# Patient Record
Sex: Male | Born: 1960
Health system: Southern US, Community
[De-identification: ages and names within clinical notes are randomized; demographics above are authoritative.]

## PROBLEM LIST (undated history)

## (undated) DIAGNOSIS — N400 Enlarged prostate without lower urinary tract symptoms: Secondary | ICD-10-CM

## (undated) DIAGNOSIS — G4730 Sleep apnea, unspecified: Secondary | ICD-10-CM

## (undated) DIAGNOSIS — M1990 Unspecified osteoarthritis, unspecified site: Secondary | ICD-10-CM

## (undated) DIAGNOSIS — I499 Cardiac arrhythmia, unspecified: Secondary | ICD-10-CM

## (undated) DIAGNOSIS — E039 Hypothyroidism, unspecified: Secondary | ICD-10-CM

## (undated) DIAGNOSIS — C801 Malignant (primary) neoplasm, unspecified: Secondary | ICD-10-CM

## (undated) DIAGNOSIS — F32A Depression, unspecified: Secondary | ICD-10-CM

## (undated) DIAGNOSIS — I10 Essential (primary) hypertension: Secondary | ICD-10-CM

## (undated) DIAGNOSIS — H9312 Tinnitus, left ear: Secondary | ICD-10-CM

## (undated) DIAGNOSIS — I491 Atrial premature depolarization: Secondary | ICD-10-CM

## (undated) DIAGNOSIS — F329 Major depressive disorder, single episode, unspecified: Secondary | ICD-10-CM

## (undated) HISTORY — PX: BACK SURGERY: SHX140

## (undated) HISTORY — PX: COLONOSCOPY: SHX174

---

## 2009-10-07 ENCOUNTER — Encounter: Admission: RE | Admit: 2009-10-07 | Discharge: 2009-10-07 | Payer: Self-pay | Admitting: Orthopedic Surgery

## 2015-09-11 ENCOUNTER — Ambulatory Visit (INDEPENDENT_AMBULATORY_CARE_PROVIDER_SITE_OTHER): Payer: Commercial Managed Care - HMO | Admitting: Sports Medicine

## 2015-09-11 ENCOUNTER — Encounter: Payer: Self-pay | Admitting: Sports Medicine

## 2015-09-11 VITALS — BP 135/74 | Ht 74.0 in | Wt 208.0 lb

## 2015-09-11 DIAGNOSIS — M2141 Flat foot [pes planus] (acquired), right foot: Secondary | ICD-10-CM | POA: Diagnosis not present

## 2015-09-11 DIAGNOSIS — M2142 Flat foot [pes planus] (acquired), left foot: Secondary | ICD-10-CM | POA: Diagnosis not present

## 2015-09-11 DIAGNOSIS — S76312A Strain of muscle, fascia and tendon of the posterior muscle group at thigh level, left thigh, initial encounter: Secondary | ICD-10-CM | POA: Diagnosis not present

## 2015-09-11 MED ORDER — NITROGLYCERIN 0.2 MG/HR TD PT24: MEDICATED_PATCH | TRANSDERMAL | Status: DC

## 2015-09-11 NOTE — Progress Notes (Signed)
  Rickey Cochran - 54 y.o. male MRN [id number]  Date of birth: 05/04/1961  SUBJECTIVE:  Including CC & ROS.  Rickey Cochran is a 54 y.o. male who presents today for left posterior hip pain.    Hip Pain left posterior, initial visit - patient has been having 2 months of ongoing left posterior hip pain located in the ischial tuberosity region. He denies a specific injury but notes that it was worse when he started increasing his training as well as running up hills. He also had new shoes at that time but denies ever having a similar presentation or injury. There are no night sweats, chills, fever, weight loss, bowel or bladder loss. Pain does not radiate down his leg and denies any paresthesias but does have pain that goes distally down his leg. Rest does improve his symptoms but he is still been able to run despite pain. Pain is not worse with going down hills.  PMHx - Updated and reviewed.  Contributory factors include: None PSHx - Updated and reviewed.  Contributory factors include:  None  FHx - Updated and reviewed.  Contributory factors include:  None  Medications - Denies    12 point ROS negative other than per HPI.   Exam:  Filed Vitals:   09/11/15 1044  BP: 135/74    Gen: NAD, A a O 3, normal affect Cardiorespiratory - Normal respiratory effort/rate.  RRR Skin: No rash or erythema Extremities: No edema Vascular: Bilateral lower extremity dorsalis pedis and posterior tibial pulse +2 Hip Exam:  Pelvic alignment unremarkable to inspection and palpation. Standing hip rotation and gait without trendelenburg / unsteadiness. Greater trochanter without tenderness to palpation. No tenderness over piriformis and greater trochanter. No SI joint tenderness and normal minimal SI movement. ROM: IR: 80 Deg, ER: 80 Deg, Flexion: 120 Deg, Extension: 100 Deg, Abduction: 45 Deg, Adduction: 45 Deg Strength:  IR: 5/5, ER: 5/5, Flexion (0 and 90 degrees): 5/5, Extension: 5/5, Abduction: 5/5,  Adduction: 5/5 Negative FADIR.  Negative FADIR with axial compression Negative FAIR and Freiberg  Negative FABER in all directions Negative Noble and Ober testing  Hamstring testing:  + pain with IR @ 90 degrees concentric and 30 degrees eccentric   Neurovascularly intact B/L LE  Imaging: [place] L hip posterior - hypoechoic changes at the conjoined tendon along with a small avulsion of the ischial tuberosity. He also has a musculotendinous junction hypoechoic region of the semimembranosus consistent with his exam. The sciatic nerve appears normal without any hypoechoic swelling or changes. The biceps femoris insertion on the fibular head appears normal and the muscle belly itself both short and long head are normal.

## 2015-09-11 NOTE — Patient Instructions (Signed)

## 2015-09-11 NOTE — Assessment & Plan Note (Signed)
Green insoles given and 3/16 inch heel support on left. -Scaphoid pads at next visit and may need custom orthotics eventually.

## 2015-09-11 NOTE — Assessment & Plan Note (Signed)
Evidence of tendinous junction problem as well as semimembranosus musculotendinous junction injury. -Therapy exercises gone over in depth with patient -Compression sleeve given and will wear with a type activity. -NSAIDs when necessary along with heat prior to exercise. Ice after exercise. -We will start Nordic eccentric strengthening when no pain. -Nitroglycerin patch to area.

## 2015-10-15 ENCOUNTER — Ambulatory Visit (INDEPENDENT_AMBULATORY_CARE_PROVIDER_SITE_OTHER): Payer: Commercial Managed Care - HMO | Admitting: Sports Medicine

## 2015-10-15 ENCOUNTER — Encounter: Payer: Self-pay | Admitting: Sports Medicine

## 2015-10-15 VITALS — BP 126/80 | Ht 74.0 in | Wt 205.0 lb

## 2015-10-15 DIAGNOSIS — S76312D Strain of muscle, fascia and tendon of the posterior muscle group at thigh level, left thigh, subsequent encounter: Secondary | ICD-10-CM

## 2015-10-15 DIAGNOSIS — M2141 Flat foot [pes planus] (acquired), right foot: Secondary | ICD-10-CM

## 2015-10-15 DIAGNOSIS — M2142 Flat foot [pes planus] (acquired), left foot: Secondary | ICD-10-CM | POA: Diagnosis not present

## 2015-10-15 NOTE — Assessment & Plan Note (Signed)
I added a larger heel lift on his sports insoles and this felt better  He will continue to use these but if they do not feel supportive enough we can always go to a custom orthotic

## 2015-10-15 NOTE — Progress Notes (Signed)
Patient ID: Rickey Cochran, male   DOB: 03-Nov-1961, 54 y.o.   MRN: [id number]  Patient returns for a left hamstring injury He states this is about 75% improved He has started back running and is up to 10 miles per week His normal training is about 22 miles per week He did the exercises faithfully for 2 weeks and has been little bit inconsistent for the past 2 weeks Using compression sleeve Sports insoles seem helpful  Review of systems No sciatica No numbness in the feet or legs No back pain No prostate or urinary tract infections  Physical exam  No acute distress, muscular male BP 126/80 mmHg  Ht 6\' 2"  (1.88 m)  Wt 205 lb (92.987 kg)  BMI 26.31 kg/m2  Full range of motion of the hip Normal hamstring motion Hip abduction strong bilaterally Hamstring testing in 4 positions is equal now on the left and right  Running gait shows that he is primarily a heel striker with some foot turnout bilaterally Pronation is  well controlled with sports insoles  Still mild tenderness to palpation of the left ischial tuberosity

## 2015-10-15 NOTE — Assessment & Plan Note (Signed)
He has made nice progress  Gradually increase his running to 2 miles per week  Try to do the home exercises at least 3-4 days per week adding some 1 leg knee bands and some lunges  Continue using compression sleeve  Recheck in 6 weeks to scan the small tear and see if this has healed

## 2016-04-15 MED FILL — AMOXICILLIN 500 MG CAPSULE: 500 | 7 days supply | Qty: 14 | Fill #0

## 2016-06-30 ENCOUNTER — Encounter: Payer: Self-pay | Admitting: Sports Medicine

## 2016-06-30 ENCOUNTER — Ambulatory Visit (INDEPENDENT_AMBULATORY_CARE_PROVIDER_SITE_OTHER): Payer: Commercial Managed Care - HMO | Admitting: Sports Medicine

## 2016-06-30 DIAGNOSIS — M25551 Pain in right hip: Secondary | ICD-10-CM | POA: Insufficient documentation

## 2016-06-30 DIAGNOSIS — M25552 Pain in left hip: Secondary | ICD-10-CM

## 2016-06-30 MED ORDER — MELOXICAM 15 MG PO TABS: 15.0000 mg | ORAL_TABLET | Freq: Every day | ORAL | 0 refills | Status: DC

## 2016-06-30 NOTE — Patient Instructions (Signed)
Thank you for coming in,   Please make an appointment in two weeks for an injection if your symptoms haven't improved.   Please use the medication in the mean time.   Please try the exercises provided to you.     Please feel free to call with any questions or concerns at any time, at [phone]. --Dr. Raeford Razor

## 2016-06-30 NOTE — Progress Notes (Signed)
  Rickey Cochran - 55 y.o. male MRN [id number]  Date of birth: 09-12-1961  SUBJECTIVE:  Including CC & ROS.  Chief Complaint  Patient presents with  . Hip Pain     Rickey Cochran is a 55 year old male that is presenting with left hip pain. He reports the pain is on the lateral aspect but also anterior and posterior in nature. This pain isn't present for a couple of months now. He was getting over hamstring issue last fall and started running more this spring. He noticed that the pain is occurring the more that he was running. He is able to run on Sunday with no problems but ran this morning and had pain at the beginning of the run. The pain is achy in nature and intermittent. The pain has no symptoms of radiculopathy. He denies a numbness or tingling.  ROS: No unexpected weight loss, fever, chills, swelling, instability, redness, otherwise see HPI    HISTORY: Past Medical, Surgical, Social, and Family History Reviewed & Updated per EMR.   Pertinent Historical Findings include: PMSHx -  Pes planus of both feet  PSHx -  No tobacco or alcohol use  FHx -  Mother with joint problems.   DATA REVIEWED: None to review   PHYSICAL EXAM:  VS: BP:114/61  HR:(!) 47bpm  TEMP: ( )  RESP:   HT:6\' 2"  (188 cm)   WT:208 lb (94.3 kg)  BMI:26.8 PHYSICAL EXAM: Gen: NAD, alert, cooperative with exam, well-appearing HEENT: clear conjunctiva, EOMI CV:  no edema, capillary refill brisk,  Resp: non-labored, normal speech Skin: no rashes, normal turgor  Neuro: no gross deficits.  Psych:  alert and oriented Hip: Some pain to palpation in the left greater trochanter. Mild tenderness to palpation on the anterior superior iliac spine on the left side. IR: 70 Deg, ER: 45 Deg, Flexion: 120 Deg,  Normal strength with hip abduction bilaterally. Strength testing with gluteus maximus was weaker than expected bilaterally Standing hip rotation and gait without trendelenburg sign / unsteadiness. No tenderness over  piriformis. Gait Normal gait with appropriate base width and stride length Foot: Has slight pronation on the left side Knee: extension and flexion adequate w/o genu varus or valgus Hip: No circumduction or contralateral drop Trunk: Neutral w/o lean  Limited ultrasound: Left hip: Views of the greater trochanter obtained with normal insertion of the gluteus medius and piriformis. This was viewed in the short and long axis. Enlargement of the bursa at the intersection between the gluteus medius and gluteus minimus that was measured to be 2.74 cm. There is also scar tissue formation that was hyperechoic just proximal to this area. Findings are consistent with bursitis.   ASSESSMENT & PLAN:   Left hip pain Most likely his pain is coming from this bursitis that was observed on ultrasound. His internal range of motion is normal which would not suggest arthritic change. His gluteus medius is strong but gluteus max is weaker. - Prescribed mobic. - He is going on a hike over the next few days so he'll follow-up in 2 weeks for an injection if symptoms are still occurring. - Prescribed home exercises that included lateral leg list, side step ups, lateral hip rotations, and kick swings

## 2016-06-30 NOTE — Assessment & Plan Note (Signed)
Most likely his pain is coming from this bursitis that was observed on ultrasound. His internal range of motion is normal which would not suggest arthritic change. His gluteus medius is strong but gluteus max is weaker. - Prescribed mobic. - He is going on a hike over the next few days so he'll follow-up in 2 weeks for an injection if symptoms are still occurring. - Prescribed home exercises that included lateral leg list, side step ups, lateral hip rotations, and kick swings

## 2016-07-14 ENCOUNTER — Ambulatory Visit (INDEPENDENT_AMBULATORY_CARE_PROVIDER_SITE_OTHER): Payer: Commercial Managed Care - HMO | Admitting: Sports Medicine

## 2016-07-14 ENCOUNTER — Encounter: Payer: Self-pay | Admitting: Sports Medicine

## 2016-07-14 VITALS — BP 130/70 | Ht 74.0 in | Wt 208.0 lb

## 2016-07-14 DIAGNOSIS — M25552 Pain in left hip: Secondary | ICD-10-CM

## 2016-07-14 MED ORDER — METHYLPREDNISOLONE ACETATE 40 MG/ML IJ SUSP
40.0000 mg | Freq: Once | INTRAMUSCULAR | Status: AC
  Administered 2016-07-14: 40 mg via INTRA_ARTICULAR

## 2016-07-14 NOTE — Progress Notes (Signed)
  Rickey Cochran - 55 y.o. male MRN [id number]  Date of birth: 04/12/1961  SUBJECTIVE:  Including CC & ROS.    Rickey Cochran is a 55 year old male that is presenting with left greater trochanteric bursitis. He was seen on 06/30/16 and this was viewed with ultrasound. He presents today for injection.  DATA REVIEWED: 06/30/16: limited US: left hip: Bursa appear to be enlarged at the intersection between the gluteus medius and the gluteus minimus.  PHYSICAL EXAM:  VS: BP:130/70  HR: bpm  TEMP: ( )  RESP:   HT:6\' 2"  (188 cm)   WT:208 lb (94.3 kg)  BMI:26.8   Aspiration/Injection Procedure Note Rickey Cochran [birth year]  Procedure: Injection Indications: Left greater trochanteric bursitis  Procedure Details Consent: Risks of procedure as well as the alternatives and risks of each were explained to the (patient/caregiver).  Consent for procedure obtained. Time Out: Verified patient identification, verified procedure, site/side was marked, verified correct patient position, special equipment/implants available, medications/allergies/relevent history reviewed, required imaging and test results available.  Performed.  The area was cleaned with iodine and alcohol swabs.    The left greater trochanter bursa was injected using 1 cc of 40 mg Depomedrol and 4 cc's of 1% lidocaine with a 21 1 1/2" needle.  Ultrasound was used. Images were obtained in transverse views showing the injection.    Patient did tolerate procedure well.   ASSESSMENT & PLAN:   Left hip pain Injected his left greater trochanter bursa with ultrasound guidance today. Images were taken. He tolerated procedure well. - Advised to follow-up in 6 weeks of having known her admit and also to rescan with the ultrasound look for the calcific changes. - Advised that he can resume his exercises and stretches after 5 days from today.

## 2016-07-14 NOTE — Assessment & Plan Note (Signed)
Injected his left greater trochanter bursa with ultrasound guidance today. Images were taken. He tolerated procedure well. - Advised to follow-up in 6 weeks of having known her admit and also to rescan with the ultrasound look for the calcific changes. - Advised that he can resume his exercises and stretches after 5 days from today.

## 2016-08-11 ENCOUNTER — Ambulatory Visit: Payer: Commercial Managed Care - HMO | Admitting: Sports Medicine

## 2016-08-25 ENCOUNTER — Ambulatory Visit (INDEPENDENT_AMBULATORY_CARE_PROVIDER_SITE_OTHER): Payer: Commercial Managed Care - HMO | Admitting: Sports Medicine

## 2016-08-25 ENCOUNTER — Ambulatory Visit: Payer: Self-pay

## 2016-08-25 ENCOUNTER — Encounter: Payer: Self-pay | Admitting: Sports Medicine

## 2016-08-25 VITALS — BP 128/60 | Ht 74.0 in | Wt 208.0 lb

## 2016-08-25 DIAGNOSIS — M25552 Pain in left hip: Secondary | ICD-10-CM

## 2016-08-25 NOTE — Progress Notes (Signed)
CC: Left Hip Pain  Last visit patient received an injection to the left gluteus medius He had one week relief from pain Pain has returned to about the same level Hurts  lateral hip after he has been sitting / gets up to walk Pain with prolonged standing Not much pain with running Some pain noted along uphill and downhill with hiking or running  Pain now seems more anterior than lateral  Social history Nonsmoker  Review of systems No sciatica No groin pain  Physical examination Athletic male no acute distress BP 128/60   Ht 6\' 2"  (1.88 m)   Wt 208 lb (94.3 kg)   BMI 26.71 kg/m   Rotational range of motion of the hips is normal FABER is tight on the right side Tenderness to palpation over the left tensor fascia lata and upper thigh Mild tenderness just above the greater trochanter Hip abduction strength is excellent  Ultrasound of left hip Area of calcification in the gluteus medius actually looks somewhat improved Mild irregularity of the greater trochanter No obvious tears or hypoechoic changes of the gluteus medius or minimus In the upper rectus femoris at the reflected head there is a half centimeter hypoechoic gap and there is some calcification There is 1 small calcification separated from the attachment Doppler flow was not remarkable  Impression: 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

## 2016-08-25 NOTE — Assessment & Plan Note (Signed)
Calcific tendinopathy changes of gluteus medius seem somewhat improved but may not have been source of pain.  Further [place] scanning shows more involvement of the  Rectus femoris.  This may be why pain is more anterior.  I suggested modifying his home exercise program. Emphasize more hip flexion. 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  Go ahead and run and less this is worsening his symptoms. We will recheck if not resolved in 3 months.

## 2016-12-23 MED FILL — SERTRALINE HCL 100 MG TAB: 100 | 30 days supply | Qty: 30 | Fill #0

## 2017-01-24 MED FILL — SERTRALINE HCL 100 MG TAB: 100 | 30 days supply | Qty: 30 | Fill #1

## 2017-02-24 MED FILL — SERTRALINE HCL 100 MG TAB: 100 | 30 days supply | Qty: 30 | Fill #2

## 2017-03-24 MED FILL — SERTRALINE HCL 100 MG TAB: 100 | 30 days supply | Qty: 30 | Fill #3

## 2017-04-21 MED FILL — SERTRALINE HCL 100 MG TAB: 100 | 30 days supply | Qty: 30 | Fill #4

## 2017-05-24 MED FILL — SERTRALINE HCL 100 MG TAB: 100 | 30 days supply | Qty: 30 | Fill #5

## 2017-06-27 MED FILL — SERTRALINE HCL 100 MG TAB: 100 | 30 days supply | Qty: 30 | Fill #6

## 2017-07-27 MED FILL — SERTRALINE HCL 100 MG TAB: 100 | 30 days supply | Qty: 30 | Fill #7

## 2017-08-26 MED FILL — SERTRALINE HCL 100 MG TAB: 100 | 30 days supply | Qty: 30 | Fill #8

## 2017-09-07 DIAGNOSIS — N400 Enlarged prostate without lower urinary tract symptoms: Secondary | ICD-10-CM | POA: Diagnosis not present

## 2017-09-07 DIAGNOSIS — F338 Other recurrent depressive disorders: Secondary | ICD-10-CM | POA: Diagnosis not present

## 2017-09-07 DIAGNOSIS — I10 Essential (primary) hypertension: Secondary | ICD-10-CM | POA: Diagnosis not present

## 2017-09-07 DIAGNOSIS — Z23 Encounter for immunization: Secondary | ICD-10-CM | POA: Diagnosis not present

## 2017-09-07 DIAGNOSIS — Z8371 Family history of colonic polyps: Secondary | ICD-10-CM | POA: Diagnosis not present

## 2017-09-07 DIAGNOSIS — R7301 Impaired fasting glucose: Secondary | ICD-10-CM | POA: Diagnosis not present

## 2017-09-07 DIAGNOSIS — Z0000 Encounter for general adult medical examination without abnormal findings: Secondary | ICD-10-CM | POA: Diagnosis not present

## 2017-09-07 DIAGNOSIS — E7800 Pure hypercholesterolemia, unspecified: Secondary | ICD-10-CM | POA: Diagnosis not present

## 2017-09-26 MED FILL — SERTRALINE HCL 100 MG TAB: 100 | 90 days supply | Qty: 90 | Fill #0

## 2017-12-26 MED FILL — SERTRALINE HCL 100 MG TAB: 100 | 90 days supply | Qty: 90 | Fill #1

## 2018-03-28 ENCOUNTER — Other Ambulatory Visit: Payer: Commercial Managed Care - HMO

## 2018-03-28 ENCOUNTER — Other Ambulatory Visit: Payer: Self-pay | Admitting: Family Medicine

## 2018-03-28 DIAGNOSIS — R221 Localized swelling, mass and lump, neck: Secondary | ICD-10-CM

## 2018-03-29 ENCOUNTER — Ambulatory Visit (INDEPENDENT_AMBULATORY_CARE_PROVIDER_SITE_OTHER): Payer: No Typology Code available for payment source

## 2018-03-29 ENCOUNTER — Other Ambulatory Visit: Payer: Self-pay | Admitting: Family Medicine

## 2018-03-29 DIAGNOSIS — E042 Nontoxic multinodular goiter: Secondary | ICD-10-CM | POA: Diagnosis not present

## 2018-03-29 DIAGNOSIS — R221 Localized swelling, mass and lump, neck: Secondary | ICD-10-CM

## 2018-03-29 MED FILL — SERTRALINE HCL 100 MG TAB: 100 | 90 days supply | Qty: 90 | Fill #0

## 2018-03-30 ENCOUNTER — Other Ambulatory Visit: Payer: Self-pay | Admitting: Family Medicine

## 2018-03-30 DIAGNOSIS — E041 Nontoxic single thyroid nodule: Secondary | ICD-10-CM

## 2018-04-13 ENCOUNTER — Other Ambulatory Visit (HOSPITAL_COMMUNITY)
Admission: RE | Admit: 2018-04-13 | Discharge: 2018-04-13 | Disposition: A | Discharge status: Home / Self Care | Payer: No Typology Code available for payment source | Source: Ambulatory Visit | Attending: Radiology | Admitting: Radiology

## 2018-04-13 ENCOUNTER — Ambulatory Visit
Admission: RE | Admit: 2018-04-13 | Discharge: 2018-04-13 | Disposition: A | Discharge status: Home / Self Care | Payer: No Typology Code available for payment source | Source: Ambulatory Visit | Attending: Family Medicine | Admitting: Family Medicine

## 2018-04-13 DIAGNOSIS — E041 Nontoxic single thyroid nodule: Secondary | ICD-10-CM | POA: Diagnosis present

## 2018-05-01 ENCOUNTER — Ambulatory Visit: Payer: Self-pay | Admitting: General Surgery

## 2018-05-01 NOTE — H&P (Signed)
History of Present Illness Ralene Ok MD; 05/01/2018 9:55 AM) The patient is a 57 year old male who presents with a thyroid nodule. Referred by: Mitzi Hansen brake, FNP Chief Complaint: Thyroid nodule  Patient is a 57 year old male who comes in with a one-month history of a right thyroid nodule. Patient states that she had no symptoms prior to visualizing the nodule. He states that he had no dysphagia, dyspnea with lying down. Patient underwent ultrasound which revealed a 5.2 cm right-sided nodule. Patient also had a left thyroid approximate 1 cm nodule. The right thyroid nodule was biopsied. This returned as follicular cells. It was recommended that the left thyroid nodule be follow back up in 6-12 months.  Patient had no previous history of cancer.  PT is scheduled to run 1/2 marathon in [place] in [place]   Past Surgical History Lindwood Coke, RN; 05/01/2018 9:28 AM) Oral Surgery   Diagnostic Studies History Lindwood Coke, RN; 05/01/2018 9:28 AM) Colonoscopy  5-10 years ago  Allergies Lindwood Coke, RN; 05/01/2018 9:28 AM) No Known Drug Allergies [05/01/2018]: Allergies Reconciled   Medication History (Diane Herrin, RN; 05/01/2018 9:30 AM) Sertraline HCl (100MG  Tablet, Oral) Active. Multivitamin Adult (Oral) Active. Vitamin B12 (100MCG Tablet, Oral) Active. Red Yeast Rice (600MG  Tablet, Oral) Active. Medications Reconciled  Social History Lindwood Coke, RN; 05/01/2018 9:28 AM) Alcohol use  Remotely quit alcohol use. Caffeine use  Carbonated beverages, Coffee, Tea. No drug use  Tobacco use  Former smoker.  Family History Lindwood Coke, RN; 05/01/2018 9:28 AM) Arthritis  Mother. Colon Polyps  Father. Depression  Father. Heart Disease  Father, Mother. Hypertension  Father, Mother. Melanoma  Father.  Other Problems (Diane Herrin, RN; 05/01/2018 9:28 AM) Back Pain  Depression  High blood pressure  Hypercholesterolemia     Review of Systems  Ralene Ok MD; 05/01/2018 9:53 AM) General Not Present- Appetite Loss, Chills, Fatigue, Fever, Night Sweats, Weight Gain and Weight Loss. Skin Not Present- Change in Wart/Mole, Dryness, Hives, Jaundice, New Lesions, Non-Healing Wounds, Rash and Ulcer. HEENT Not Present- Earache, Hearing Loss, Hoarseness, Nose Bleed, Oral Ulcers, Ringing in the Ears, Seasonal Allergies, Sinus Pain, Sore Throat, Visual Disturbances, Wears glasses/contact lenses and Yellow Eyes. Respiratory Present- Snoring. Not Present- Bloody sputum, Chronic Cough, Difficulty Breathing and Wheezing. Breast Not Present- Breast Mass, Breast Pain, Nipple Discharge and Skin Changes. Cardiovascular Not Present- Chest Pain, Difficulty Breathing Lying Down, Leg Cramps, Palpitations, Rapid Heart Rate, Shortness of Breath and Swelling of Extremities. Gastrointestinal Not Present- Abdominal Pain, Bloating, Bloody Stool, Change in Bowel Habits, Chronic diarrhea, Constipation, Difficulty Swallowing, Excessive gas, Gets full quickly at meals, Hemorrhoids, Indigestion, Nausea, Rectal Pain and Vomiting. Male Genitourinary Not Present- Blood in Urine, Change in Urinary Stream, Frequency, Impotence, Nocturia, Painful Urination, Urgency and Urine Leakage. Musculoskeletal Not Present- Back Pain, Joint Pain, Joint Stiffness, Muscle Pain, Muscle Weakness and Swelling of Extremities. Neurological Not Present- Decreased Memory, Fainting, Headaches, Numbness, Seizures, Tingling, Tremor, Trouble walking and Weakness. Psychiatric Not Present- Anxiety, Bipolar, Change in Sleep Pattern, Depression, Fearful and Frequent crying. Endocrine Present- Heat Intolerance. Not Present- Cold Intolerance, Excessive Hunger, Hair Changes, Hot flashes and New Diabetes. Hematology Not Present- Blood Thinners, Easy Bruising, Excessive bleeding, Gland problems, HIV and Persistent Infections. All other systems negative  Vitals (Diane Herrin RN; 05/01/2018 9:31 AM) 05/01/2018  9:30 AM Weight: 214.38 lb Height: 74in Body Surface Area: 2.24 m Body Mass Index: 27.52 kg/m  Temp.: 97.44F(Oral)  Pulse: 44 (Regular)  P.OX: 98% (Room air) BP: 134/76 (Sitting, Left Arm, Standard)  Physical Exam Ralene Ok MD; 05/01/2018 9:56 AM) The physical exam findings are as follows: Note:Constitutional: No acute distress, conversant, appears stated age  Eyes: Anicteric sclerae, moist conjunctiva, no lid lag  Neck: Right thyroid nodule, soft, no nodule palpated in the left thyroidm, trachea midline, no cervical lymphadenopathy  Lungs: Clear to auscultation biilaterally, normal respiratory effot  Cardiovascular: regular rate & rhythm, no murmurs, no peripheal edema, pedal pulses 2+  GI: Soft, no masses or hepatosplenomegaly, non-tender to palpation  MSK: Normal gait, no clubbing cyanosis, edema  Skin: No rashes, palpation reveals normal skin turgor  Psychiatric: Appropriate judgment and insight, oriented to person, place, and time    Assessment & Plan Ralene Ok MD; 05/01/2018 9:57 AM) THYROID NODULE (E04.1) Impression: 57 year old male with a right thyroid nodule, with biopsy results and follicular cells. 1. The patient is ready to proceed to the operating room for right thyroid lobectomy. 2. All risks and benefits were discussed with the patient to generally include: infection, bleeding, damage to the recurrent laryngeal nerve, damage to parathyroid glands, and possible need for further surgery. Alternatives were offered and described. All questions were answered and the patient voiced understanding of the procedure and wishes to proceed.

## 2018-05-01 NOTE — H&P (View-Only) (Signed)
History of Present Illness Ralene Ok MD; 05/01/2018 9:55 AM) The patient is a 57 year old male who presents with a thyroid nodule. Referred by: Mitzi Hansen brake, FNP Chief Complaint: Thyroid nodule  Patient is a 57 year old male who comes in with a one-month history of a right thyroid nodule. Patient states that she had no symptoms prior to visualizing the nodule. He states that he had no dysphagia, dyspnea with lying down. Patient underwent ultrasound which revealed a 5.2 cm right-sided nodule. Patient also had a left thyroid approximate 1 cm nodule. The right thyroid nodule was biopsied. This returned as follicular cells. It was recommended that the left thyroid nodule be follow back up in 6-12 months.  Patient had no previous history of cancer.  PT is scheduled to run 1/2 marathon in [place] in [place]   Past Surgical History Lindwood Coke, RN; 05/01/2018 9:28 AM) Oral Surgery   Diagnostic Studies History Lindwood Coke, RN; 05/01/2018 9:28 AM) Colonoscopy  5-10 years ago  Allergies Lindwood Coke, RN; 05/01/2018 9:28 AM) No Known Drug Allergies [05/01/2018]: Allergies Reconciled   Medication History (Diane Herrin, RN; 05/01/2018 9:30 AM) Sertraline HCl (100MG  Tablet, Oral) Active. Multivitamin Adult (Oral) Active. Vitamin B12 (100MCG Tablet, Oral) Active. Red Yeast Rice (600MG  Tablet, Oral) Active. Medications Reconciled  Social History Lindwood Coke, RN; 05/01/2018 9:28 AM) Alcohol use  Remotely quit alcohol use. Caffeine use  Carbonated beverages, Coffee, Tea. No drug use  Tobacco use  Former smoker.  Family History Lindwood Coke, RN; 05/01/2018 9:28 AM) Arthritis  Mother. Colon Polyps  Father. Depression  Father. Heart Disease  Father, Mother. Hypertension  Father, Mother. Melanoma  Father.  Other Problems (Diane Herrin, RN; 05/01/2018 9:28 AM) Back Pain  Depression  High blood pressure  Hypercholesterolemia     Review of Systems  Ralene Ok MD; 05/01/2018 9:53 AM) General Not Present- Appetite Loss, Chills, Fatigue, Fever, Night Sweats, Weight Gain and Weight Loss. Skin Not Present- Change in Wart/Mole, Dryness, Hives, Jaundice, New Lesions, Non-Healing Wounds, Rash and Ulcer. HEENT Not Present- Earache, Hearing Loss, Hoarseness, Nose Bleed, Oral Ulcers, Ringing in the Ears, Seasonal Allergies, Sinus Pain, Sore Throat, Visual Disturbances, Wears glasses/contact lenses and Yellow Eyes. Respiratory Present- Snoring. Not Present- Bloody sputum, Chronic Cough, Difficulty Breathing and Wheezing. Breast Not Present- Breast Mass, Breast Pain, Nipple Discharge and Skin Changes. Cardiovascular Not Present- Chest Pain, Difficulty Breathing Lying Down, Leg Cramps, Palpitations, Rapid Heart Rate, Shortness of Breath and Swelling of Extremities. Gastrointestinal Not Present- Abdominal Pain, Bloating, Bloody Stool, Change in Bowel Habits, Chronic diarrhea, Constipation, Difficulty Swallowing, Excessive gas, Gets full quickly at meals, Hemorrhoids, Indigestion, Nausea, Rectal Pain and Vomiting. Male Genitourinary Not Present- Blood in Urine, Change in Urinary Stream, Frequency, Impotence, Nocturia, Painful Urination, Urgency and Urine Leakage. Musculoskeletal Not Present- Back Pain, Joint Pain, Joint Stiffness, Muscle Pain, Muscle Weakness and Swelling of Extremities. Neurological Not Present- Decreased Memory, Fainting, Headaches, Numbness, Seizures, Tingling, Tremor, Trouble walking and Weakness. Psychiatric Not Present- Anxiety, Bipolar, Change in Sleep Pattern, Depression, Fearful and Frequent crying. Endocrine Present- Heat Intolerance. Not Present- Cold Intolerance, Excessive Hunger, Hair Changes, Hot flashes and New Diabetes. Hematology Not Present- Blood Thinners, Easy Bruising, Excessive bleeding, Gland problems, HIV and Persistent Infections. All other systems negative  Vitals (Diane Herrin RN; 05/01/2018 9:31 AM) 05/01/2018  9:30 AM Weight: 214.38 lb Height: 74in Body Surface Area: 2.24 m Body Mass Index: 27.52 kg/m  Temp.: 97.43F(Oral)  Pulse: 44 (Regular)  P.OX: 98% (Room air) BP: 134/76 (Sitting, Left Arm, Standard)  Physical Exam Ralene Ok MD; 05/01/2018 9:56 AM) The physical exam findings are as follows: Note:Constitutional: No acute distress, conversant, appears stated age  Eyes: Anicteric sclerae, moist conjunctiva, no lid lag  Neck: Right thyroid nodule, soft, no nodule palpated in the left thyroidm, trachea midline, no cervical lymphadenopathy  Lungs: Clear to auscultation biilaterally, normal respiratory effot  Cardiovascular: regular rate & rhythm, no murmurs, no peripheal edema, pedal pulses 2+  GI: Soft, no masses or hepatosplenomegaly, non-tender to palpation  MSK: Normal gait, no clubbing cyanosis, edema  Skin: No rashes, palpation reveals normal skin turgor  Psychiatric: Appropriate judgment and insight, oriented to person, place, and time    Assessment & Plan Ralene Ok MD; 05/01/2018 9:57 AM) THYROID NODULE (E04.1) Impression: 57 year old male with a right thyroid nodule, with biopsy results and follicular cells. 1. The patient is ready to proceed to the operating room for right thyroid lobectomy. 2. All risks and benefits were discussed with the patient to generally include: infection, bleeding, damage to the recurrent laryngeal nerve, damage to parathyroid glands, and possible need for further surgery. Alternatives were offered and described. All questions were answered and the patient voiced understanding of the procedure and wishes to proceed.

## 2018-05-03 NOTE — Pre-Procedure Instructions (Signed)
Rickey Cochran  05/03/2018      [place], [place] - [address] [place] [address] Phone: [phone] Fax: [phone]    Your procedure is scheduled on June 18th.  Report to [facility] Admitting at 1000 A.M.  Call this number if you have problems the morning of surgery:  [phone]   Remember:  Nothing to eat or drink after midnight     Take these medicines the morning of surgery with A SIP OF WATER. sertraline (ZOLOFT) 100 MG tablets  7 days prior to surgery STOP taking any Aspirin(unless otherwise instructed by your surgeon), Aleve, Naproxen, Ibuprofen, Motrin, Advil, Goody's, BC's, all herbal medications, fish oil, and all vitamins     Do not wear jewelry, make-up or nail polish.  Do not wear lotions, powders, or perfumes, or deodorant.  Do not shave 48 hours prior to surgery.  Men may shave face and neck.  Do not bring valuables to the hospital.  [facility] is not responsible for any belongings or valuables.  Contacts, dentures or bridgework may not be worn into surgery.  Leave your suitcase in the car.  After surgery it may be brought to your room.  For patients admitted to the hospital, discharge time will be determined by your treatment team.  Patients discharged the day of surgery will not be allowed to drive home.    [facility]- Preparing For Surgery  Before surgery, you can play an important role. Because skin is not sterile, your skin needs to be as free of germs as possible. You can reduce the number of germs on your skin by washing with CHG (chlorahexidine gluconate) Soap before surgery.  CHG is an antiseptic cleaner which kills germs and bonds with the skin to continue killing germs even after washing.    Oral Hygiene is also important to reduce your risk of infection.  Remember - BRUSH YOUR TEETH THE MORNING OF SURGERY WITH YOUR REGULAR TOOTHPASTE  Please do not use if  you have an allergy to CHG or antibacterial soaps. If your skin becomes reddened/irritated stop using the CHG.  Do not shave (including legs and underarms) for at least 48 hours prior to first CHG shower. It is OK to shave your face.  Please follow these instructions carefully.   1. Shower the NIGHT BEFORE SURGERY and the MORNING OF SURGERY with CHG.   2. If you chose to wash your hair, wash your hair first as usual with your normal shampoo.  3. After you shampoo, rinse your hair and body thoroughly to remove the shampoo.  4. Use CHG as you would any other liquid soap. You can apply CHG directly to the skin and wash gently with a scrungie or a clean washcloth.   5. Apply the CHG Soap to your body ONLY FROM THE NECK DOWN.  Do not use on open wounds or open sores. Avoid contact with your eyes, ears, mouth and genitals (private parts). Wash Face and genitals (private parts)  with your normal soap.  6. Wash thoroughly, paying special attention to the area where your surgery will be performed.  7. Thoroughly rinse your body with warm water from the neck down.  8. DO NOT shower/wash with your normal soap after using and rinsing off the CHG Soap.  9. Pat yourself dry with a CLEAN TOWEL.  10. Wear CLEAN PAJAMAS to bed the night before surgery, wear comfortable clothes the morning  of surgery  11. Place CLEAN SHEETS on your bed the night of your first shower and DO NOT SLEEP WITH PETS.    Day of Surgery:  Do not apply any deodorants/lotions.  Please wear clean clothes to the hospital/surgery center.   Remember to brush your teeth WITH YOUR REGULAR TOOTHPASTE.    Please read over the following fact sheets that you were given.

## 2018-05-04 ENCOUNTER — Encounter (HOSPITAL_COMMUNITY): Payer: Self-pay

## 2018-05-04 ENCOUNTER — Encounter (HOSPITAL_COMMUNITY)
Admission: RE | Admit: 2018-05-04 | Discharge: 2018-05-04 | Disposition: A | Discharge status: Home / Self Care | Payer: No Typology Code available for payment source | Source: Ambulatory Visit | Attending: Anesthesiology | Admitting: Anesthesiology

## 2018-05-04 ENCOUNTER — Encounter (HOSPITAL_COMMUNITY)
Admission: RE | Admit: 2018-05-04 | Discharge: 2018-05-04 | Disposition: A | Discharge status: Home / Self Care | Payer: No Typology Code available for payment source | Source: Ambulatory Visit | Attending: General Surgery | Admitting: General Surgery

## 2018-05-04 ENCOUNTER — Other Ambulatory Visit: Payer: Self-pay

## 2018-05-04 DIAGNOSIS — Z01812 Encounter for preprocedural laboratory examination: Secondary | ICD-10-CM | POA: Insufficient documentation

## 2018-05-04 DIAGNOSIS — Z01818 Encounter for other preprocedural examination: Secondary | ICD-10-CM | POA: Insufficient documentation

## 2018-05-04 HISTORY — DX: Depression, unspecified: F32.A

## 2018-05-04 HISTORY — DX: Major depressive disorder, single episode, unspecified: F32.9

## 2018-05-04 LAB — CBC
HCT: 49.2 % (ref 39.0–52.0)
Hemoglobin: 16.7 g/dL (ref 13.0–17.0)
MCH: 30.9 pg (ref 26.0–34.0)
MCHC: 33.9 g/dL (ref 30.0–36.0)
MCV: 90.9 fL (ref 78.0–100.0)
Platelets: 251 10*3/uL (ref 150–400)
RBC: 5.41 MIL/uL (ref 4.22–5.81)
RDW: 12.2 % (ref 11.5–15.5)
WBC: 8.1 10*3/uL (ref 4.0–10.5)

## 2018-05-04 NOTE — Pre-Procedure Instructions (Signed)
Rickey Cochran  05/04/2018      [place], [place] - [address] [place] [address] Phone: [phone] Fax: [phone]    Your procedure is scheduled on June 18th.  Report to [facility] Admitting at 800 A.M.  Call this number if you have problems the morning of surgery:  [phone]   Remember:  Nothing to eat or drink after midnight     Take these medicines the morning of surgery with A SIP OF WATER. sertraline (ZOLOFT) 100 MG tablets  7 days prior to surgery STOP taking any Aspirin(unless otherwise instructed by your surgeon), Aleve, Naproxen, Ibuprofen, Motrin, Advil, Goody's, BC's, all herbal medications, fish oil, and all vitamins     Do not wear jewelry, make-up or nail polish.  Do not wear lotions, powders, or perfumes, or deodorant.  Do not shave 48 hours prior to surgery.  Men may shave face and neck.  Do not bring valuables to the hospital.  [facility] is not responsible for any belongings or valuables.  Contacts, dentures or bridgework may not be worn into surgery.  Leave your suitcase in the car.  After surgery it may be brought to your room.  For patients admitted to the hospital, discharge time will be determined by your treatment team.  Patients discharged the day of surgery will not be allowed to drive home.    [facility]- Preparing For Surgery  Before surgery, you can play an important role. Because skin is not sterile, your skin needs to be as free of germs as possible. You can reduce the number of germs on your skin by washing with CHG (chlorahexidine gluconate) Soap before surgery.  CHG is an antiseptic cleaner which kills germs and bonds with the skin to continue killing germs even after washing.    Oral Hygiene is also important to reduce your risk of infection.  Remember - BRUSH YOUR TEETH THE MORNING OF SURGERY WITH YOUR REGULAR TOOTHPASTE  Please do not use if you  have an allergy to CHG or antibacterial soaps. If your skin becomes reddened/irritated stop using the CHG.  Do not shave (including legs and underarms) for at least 48 hours prior to first CHG shower. It is OK to shave your face.  Please follow these instructions carefully.   1. Shower the NIGHT BEFORE SURGERY and the MORNING OF SURGERY with CHG.   2. If you chose to wash your hair, wash your hair first as usual with your normal shampoo.  3. After you shampoo, rinse your hair and body thoroughly to remove the shampoo.  4. Use CHG as you would any other liquid soap. You can apply CHG directly to the skin and wash gently with a scrungie or a clean washcloth.   5. Apply the CHG Soap to your body ONLY FROM THE NECK DOWN.  Do not use on open wounds or open sores. Avoid contact with your eyes, ears, mouth and genitals (private parts). Wash Face and genitals (private parts)  with your normal soap.  6. Wash thoroughly, paying special attention to the area where your surgery will be performed.  7. Thoroughly rinse your body with warm water from the neck down.  8. DO NOT shower/wash with your normal soap after using and rinsing off the CHG Soap.  9. Pat yourself dry with a CLEAN TOWEL.  10. Wear CLEAN PAJAMAS to bed the night before surgery, wear comfortable clothes the morning  of surgery  11. Place CLEAN SHEETS on your bed the night of your first shower and DO NOT SLEEP WITH PETS.    Day of Surgery:  Do not apply any deodorants/lotions.  Please wear clean clothes to the hospital/surgery center.   Remember to brush your teeth WITH YOUR REGULAR TOOTHPASTE.    Please read over the following fact sheets that you were given.

## 2018-05-04 NOTE — Progress Notes (Signed)
   05/04/18 0917  OBSTRUCTIVE SLEEP APNEA  Have you ever been diagnosed with sleep apnea through a sleep study? No  Do you snore loudly (loud enough to be heard through closed doors)?  1  Do you often feel tired, fatigued, or sleepy during the daytime (such as falling asleep during driving or talking to someone)? 0  Has anyone observed you stop breathing during your sleep? 1  Do you have, or are you being treated for high blood pressure? 0  BMI more than 35 kg/m2? 0  Age > 50 (1-yes) 1  Neck circumference greater than:Male 16 inches or larger, Male 17inches or larger? 1  Male Gender (Yes=1) 1  Obstructive Sleep Apnea Score 5  Score 5 or greater  Results sent to PCP

## 2018-05-09 ENCOUNTER — Ambulatory Visit (HOSPITAL_COMMUNITY): Payer: No Typology Code available for payment source | Admitting: Certified Registered"

## 2018-05-09 ENCOUNTER — Observation Stay (HOSPITAL_COMMUNITY)
Admission: RE | Admit: 2018-05-09 | Discharge: 2018-05-10 | Disposition: A | Discharge status: Home / Self Care | Payer: No Typology Code available for payment source | Source: Ambulatory Visit | Attending: General Surgery | Admitting: General Surgery

## 2018-05-09 ENCOUNTER — Encounter (HOSPITAL_COMMUNITY)
Admission: RE | Disposition: A | Discharge status: Home / Self Care | Payer: Self-pay | Source: Ambulatory Visit | Attending: General Surgery

## 2018-05-09 ENCOUNTER — Other Ambulatory Visit: Payer: Self-pay

## 2018-05-09 ENCOUNTER — Encounter (HOSPITAL_COMMUNITY): Payer: Self-pay | Admitting: Surgery

## 2018-05-09 DIAGNOSIS — Z9889 Other specified postprocedural states: Secondary | ICD-10-CM

## 2018-05-09 DIAGNOSIS — I10 Essential (primary) hypertension: Secondary | ICD-10-CM | POA: Insufficient documentation

## 2018-05-09 DIAGNOSIS — C73 Malignant neoplasm of thyroid gland: Secondary | ICD-10-CM | POA: Diagnosis not present

## 2018-05-09 DIAGNOSIS — F329 Major depressive disorder, single episode, unspecified: Secondary | ICD-10-CM | POA: Diagnosis not present

## 2018-05-09 DIAGNOSIS — E890 Postprocedural hypothyroidism: Secondary | ICD-10-CM

## 2018-05-09 DIAGNOSIS — Z79899 Other long term (current) drug therapy: Secondary | ICD-10-CM | POA: Diagnosis not present

## 2018-05-09 DIAGNOSIS — Z87891 Personal history of nicotine dependence: Secondary | ICD-10-CM | POA: Insufficient documentation

## 2018-05-09 DIAGNOSIS — E041 Nontoxic single thyroid nodule: Secondary | ICD-10-CM | POA: Diagnosis present

## 2018-05-09 HISTORY — PX: THYROID LOBECTOMY: SHX420

## 2018-05-09 HISTORY — DX: Essential (primary) hypertension: I10

## 2018-05-09 SURGERY — LOBECTOMY, THYROID
Anesthesia: General | Site: Neck | Laterality: Right

## 2018-05-09 MED ORDER — TRAMADOL HCL 50 MG PO TABS: 50.0000 mg | ORAL_TABLET | Freq: Four times a day (QID) | ORAL | Status: DC | PRN

## 2018-05-09 MED ORDER — BUPIVACAINE HCL 0.25 % IJ SOLN
INTRAMUSCULAR | Status: DC | PRN
  Administered 2018-05-09: 10 mL

## 2018-05-09 MED ORDER — MIDAZOLAM HCL 5 MG/5ML IJ SOLN
INTRAMUSCULAR | Status: DC | PRN
  Administered 2018-05-09: 2 mg via INTRAVENOUS

## 2018-05-09 MED ORDER — SUGAMMADEX SODIUM 200 MG/2ML IV SOLN
INTRAVENOUS | Status: DC | PRN
  Administered 2018-05-09: 200 mg via INTRAVENOUS

## 2018-05-09 MED ORDER — DEXTROSE-NACL 5-0.9 % IV SOLN
INTRAVENOUS | Status: DC
  Administered 2018-05-09 – 2018-05-10 (×2): via INTRAVENOUS

## 2018-05-09 MED ORDER — LACTATED RINGERS IV SOLN
INTRAVENOUS | Status: DC
  Administered 2018-05-09 (×2): via INTRAVENOUS

## 2018-05-09 MED ORDER — CHLORHEXIDINE GLUCONATE CLOTH 2 % EX PADS: 6.0000 | MEDICATED_PAD | Freq: Once | CUTANEOUS | Status: DC

## 2018-05-09 MED ORDER — PHENYLEPHRINE HCL 10 MG/ML IJ SOLN
INTRAVENOUS | Status: DC | PRN
  Administered 2018-05-09: 50 ug/min via INTRAVENOUS

## 2018-05-09 MED ORDER — OXYCODONE HCL 5 MG PO TABS
5.0000 mg | ORAL_TABLET | Freq: Once | ORAL | Status: AC | PRN
  Administered 2018-05-09: 5 mg via ORAL

## 2018-05-09 MED ORDER — FENTANYL CITRATE (PF) 100 MCG/2ML IJ SOLN
INTRAMUSCULAR | Status: DC | PRN
  Administered 2018-05-09: 100 ug via INTRAVENOUS

## 2018-05-09 MED ORDER — ONDANSETRON HCL 4 MG/2ML IJ SOLN
4.0000 mg | Freq: Four times a day (QID) | INTRAMUSCULAR | Status: DC | PRN
Start: 2018-05-09 — End: 2018-05-10

## 2018-05-09 MED ORDER — EPHEDRINE SULFATE 50 MG/ML IJ SOLN
INTRAMUSCULAR | Status: AC
  Filled 2018-05-09: qty 1

## 2018-05-09 MED ORDER — OXYCODONE HCL 5 MG PO TABS
ORAL_TABLET | ORAL | Status: AC
  Filled 2018-05-09: qty 1

## 2018-05-09 MED ORDER — ONDANSETRON 4 MG PO TBDP: 4.0000 mg | ORAL_TABLET | Freq: Four times a day (QID) | ORAL | Status: DC | PRN

## 2018-05-09 MED ORDER — ACETAMINOPHEN 500 MG PO TABS
1000.0000 mg | ORAL_TABLET | ORAL | Status: AC
  Administered 2018-05-09: 1000 mg via ORAL
  Filled 2018-05-09: qty 2

## 2018-05-09 MED ORDER — ROCURONIUM BROMIDE 50 MG/5ML IV SOLN
INTRAVENOUS | Status: AC
  Filled 2018-05-09: qty 1

## 2018-05-09 MED ORDER — LIDOCAINE HCL (CARDIAC) PF 100 MG/5ML IV SOSY
PREFILLED_SYRINGE | INTRAVENOUS | Status: DC | PRN
  Administered 2018-05-09: 100 mg via INTRAVENOUS
  Administered 2018-05-09: 100 mg via INTRATRACHEAL

## 2018-05-09 MED ORDER — DEXAMETHASONE SODIUM PHOSPHATE 10 MG/ML IJ SOLN
INTRAMUSCULAR | Status: DC | PRN
  Administered 2018-05-09: 10 mg via INTRAVENOUS

## 2018-05-09 MED ORDER — ACETAMINOPHEN 500 MG PO TABS
1000.0000 mg | ORAL_TABLET | Freq: Four times a day (QID) | ORAL | Status: DC
  Administered 2018-05-09 – 2018-05-10 (×3): 1000 mg via ORAL
  Filled 2018-05-09 (×3): qty 2

## 2018-05-09 MED ORDER — DEXAMETHASONE SODIUM PHOSPHATE 10 MG/ML IJ SOLN
INTRAMUSCULAR | Status: AC
  Filled 2018-05-09: qty 1

## 2018-05-09 MED ORDER — LIDOCAINE 2% (20 MG/ML) 5 ML SYRINGE
INTRAMUSCULAR | Status: AC
  Filled 2018-05-09: qty 5

## 2018-05-09 MED ORDER — OXYCODONE HCL 5 MG/5ML PO SOLN: 5.0000 mg | Freq: Once | ORAL | Status: AC | PRN

## 2018-05-09 MED ORDER — SUCCINYLCHOLINE CHLORIDE 200 MG/10ML IV SOSY
PREFILLED_SYRINGE | INTRAVENOUS | Status: AC
  Filled 2018-05-09: qty 10

## 2018-05-09 MED ORDER — ONDANSETRON HCL 4 MG/2ML IJ SOLN
INTRAMUSCULAR | Status: DC | PRN
Start: 2018-05-09 — End: 2018-05-09
  Administered 2018-05-09: 4 mg via INTRAVENOUS

## 2018-05-09 MED ORDER — 0.9 % SODIUM CHLORIDE (POUR BTL) OPTIME
TOPICAL | Status: DC | PRN
  Administered 2018-05-09: 1000 mL

## 2018-05-09 MED ORDER — GABAPENTIN 300 MG PO CAPS
300.0000 mg | ORAL_CAPSULE | ORAL | Status: AC
  Administered 2018-05-09: 300 mg via ORAL
  Filled 2018-05-09: qty 1

## 2018-05-09 MED ORDER — HEMOSTATIC AGENTS (NO CHARGE) OPTIME
TOPICAL | Status: DC | PRN
  Administered 2018-05-09: 1 via TOPICAL

## 2018-05-09 MED ORDER — HYDROMORPHONE HCL 2 MG/ML IJ SOLN
INTRAMUSCULAR | Status: AC
  Filled 2018-05-09: qty 1

## 2018-05-09 MED ORDER — CEFAZOLIN SODIUM-DEXTROSE 2-4 GM/100ML-% IV SOLN
2.0000 g | INTRAVENOUS | Status: AC
  Administered 2018-05-09: 2 g via INTRAVENOUS
  Filled 2018-05-09: qty 100

## 2018-05-09 MED ORDER — PROPOFOL 10 MG/ML IV BOLUS
INTRAVENOUS | Status: DC | PRN
  Administered 2018-05-09: 200 mg via INTRAVENOUS

## 2018-05-09 MED ORDER — FENTANYL CITRATE (PF) 250 MCG/5ML IJ SOLN
INTRAMUSCULAR | Status: AC
  Filled 2018-05-09: qty 5

## 2018-05-09 MED ORDER — MIDAZOLAM HCL 2 MG/2ML IJ SOLN
INTRAMUSCULAR | Status: AC
  Filled 2018-05-09: qty 2

## 2018-05-09 MED ORDER — PROMETHAZINE HCL 25 MG/ML IJ SOLN: 6.2500 mg | INTRAMUSCULAR | Status: DC | PRN

## 2018-05-09 MED ORDER — ONDANSETRON HCL 4 MG/2ML IJ SOLN
INTRAMUSCULAR | Status: AC
  Filled 2018-05-09: qty 2

## 2018-05-09 MED ORDER — BUPIVACAINE HCL (PF) 0.25 % IJ SOLN
INTRAMUSCULAR | Status: AC
  Filled 2018-05-09: qty 30

## 2018-05-09 MED ORDER — ROCURONIUM BROMIDE 100 MG/10ML IV SOLN
INTRAVENOUS | Status: DC | PRN
  Administered 2018-05-09: 20 mg via INTRAVENOUS
  Administered 2018-05-09: 50 mg via INTRAVENOUS

## 2018-05-09 MED ORDER — HYDROMORPHONE HCL 2 MG/ML IJ SOLN
0.3000 mg | INTRAMUSCULAR | Status: DC | PRN
  Administered 2018-05-09 (×3): 0.5 mg via INTRAVENOUS

## 2018-05-09 MED ORDER — CELECOXIB 200 MG PO CAPS
200.0000 mg | ORAL_CAPSULE | ORAL | Status: AC
  Administered 2018-05-09: 200 mg via ORAL
  Filled 2018-05-09: qty 1

## 2018-05-09 SURGICAL SUPPLY — 46 items
ATTRACTOMAT 16X20 MAGNETIC DRP (DRAPES) ×2 IMPLANT
BLADE SURG 15 STRL LF DISP TIS (BLADE) ×1 IMPLANT
BLADE SURG 15 STRL SS (BLADE) ×1
CANISTER SUCT 3000ML PPV (MISCELLANEOUS) ×2 IMPLANT
CHLORAPREP W/TINT 26ML (MISCELLANEOUS) ×2 IMPLANT
CLIP VESOCCLUDE MED 24/CT (CLIP) ×2 IMPLANT
CLIP VESOCCLUDE SM WIDE 24/CT (CLIP) ×2 IMPLANT
CONT SPEC 4OZ CLIKSEAL STRL BL (MISCELLANEOUS) ×2 IMPLANT
COVER SURGICAL LIGHT HANDLE (MISCELLANEOUS) ×2 IMPLANT
CRADLE DONUT ADULT HEAD (MISCELLANEOUS) ×2 IMPLANT
DERMABOND ADVANCED (GAUZE/BANDAGES/DRESSINGS) ×1
DERMABOND ADVANCED .7 DNX12 (GAUZE/BANDAGES/DRESSINGS) ×1 IMPLANT
DRAPE LAPAROTOMY 100X72 PEDS (DRAPES) ×2 IMPLANT
DRAPE UTILITY XL STRL (DRAPES) ×4 IMPLANT
ELECT COATED BLADE 2.86 ST (ELECTRODE) ×2 IMPLANT
ELECT REM PT RETURN 9FT ADLT (ELECTROSURGICAL) ×2
ELECTRODE REM PT RTRN 9FT ADLT (ELECTROSURGICAL) ×1 IMPLANT
GAUZE SPONGE 4X4 12PLY STRL (GAUZE/BANDAGES/DRESSINGS) ×2 IMPLANT
GAUZE SPONGE 4X4 16PLY XRAY LF (GAUZE/BANDAGES/DRESSINGS) ×2 IMPLANT
GLOVE BIO SURGEON STRL SZ7.5 (GLOVE) ×2 IMPLANT
GOWN STRL REUS W/ TWL LRG LVL3 (GOWN DISPOSABLE) ×1 IMPLANT
GOWN STRL REUS W/ TWL XL LVL3 (GOWN DISPOSABLE) ×1 IMPLANT
GOWN STRL REUS W/TWL LRG LVL3 (GOWN DISPOSABLE) ×1
GOWN STRL REUS W/TWL XL LVL3 (GOWN DISPOSABLE) ×1
HEMOSTAT SURGICEL 2X4 FIBR (HEMOSTASIS) ×2 IMPLANT
KIT BASIN OR (CUSTOM PROCEDURE TRAY) ×2 IMPLANT
KIT TURNOVER KIT B (KITS) ×2 IMPLANT
NEEDLE HYPO 25GX1X1/2 BEV (NEEDLE) ×2 IMPLANT
NS IRRIG 1000ML POUR BTL (IV SOLUTION) ×2 IMPLANT
PACK SURGICAL SETUP 50X90 (CUSTOM PROCEDURE TRAY) ×2 IMPLANT
PAD ARMBOARD 7.5X6 YLW CONV (MISCELLANEOUS) ×2 IMPLANT
PENCIL BUTTON HOLSTER BLD 10FT (ELECTRODE) ×2 IMPLANT
SHEARS HARMONIC 9CM CVD (BLADE) ×2 IMPLANT
SPECIMEN JAR MEDIUM (MISCELLANEOUS) IMPLANT
SPONGE INTESTINAL PEANUT (DISPOSABLE) ×2 IMPLANT
SUT MNCRL AB 4-0 PS2 18 (SUTURE) ×2 IMPLANT
SUT SILK 2 0 (SUTURE) ×1
SUT SILK 2-0 18XBRD TIE 12 (SUTURE) ×1 IMPLANT
SUT SILK 3 0 (SUTURE) ×1
SUT SILK 3-0 18XBRD TIE 12 (SUTURE) ×1 IMPLANT
SUT VIC AB 3-0 SH 18 (SUTURE) ×2 IMPLANT
SYR BULB 3OZ (MISCELLANEOUS) ×2 IMPLANT
SYR CONTROL 10ML LL (SYRINGE) ×2 IMPLANT
TOWEL OR 17X24 6PK STRL BLUE (TOWEL DISPOSABLE) ×2 IMPLANT
TOWEL OR 17X26 10 PK STRL BLUE (TOWEL DISPOSABLE) ×2 IMPLANT
TUBE CONNECTING 12X1/4 (SUCTIONS) ×2 IMPLANT

## 2018-05-09 NOTE — Anesthesia Preprocedure Evaluation (Signed)
Anesthesia Evaluation  Patient identified by MRN, date of birth, ID band Patient awake    Reviewed: Allergy & Precautions, NPO status , Patient's Chart, lab work & pertinent test results  Airway Mallampati: II  TM Distance: >3 FB Neck ROM: Full    Dental no notable dental hx.    Pulmonary neg pulmonary ROS, former smoker,    Pulmonary exam normal breath sounds clear to auscultation       Cardiovascular negative cardio ROS Normal cardiovascular exam Rhythm:Regular Rate:Normal     Neuro/Psych negative neurological ROS  negative psych ROS   GI/Hepatic negative GI ROS, Neg liver ROS,   Endo/Other  negative endocrine ROS  Renal/GU negative Renal ROS  negative genitourinary   Musculoskeletal negative musculoskeletal ROS (+)   Abdominal   Peds negative pediatric ROS (+)  Hematology negative hematology ROS (+)   Anesthesia Other Findings   Reproductive/Obstetrics negative OB ROS                             Anesthesia Physical Anesthesia Plan  ASA: I  Anesthesia Plan: General   Post-op Pain Management:    Induction: Intravenous  PONV Risk Score and Plan: 2 and Ondansetron, Dexamethasone and Treatment may vary due to age or medical condition  Airway Management Planned: Oral ETT  Additional Equipment:   Intra-op Plan:   Post-operative Plan: Extubation in OR  Informed Consent: I have reviewed the patients History and Physical, chart, labs and discussed the procedure including the risks, benefits and alternatives for the proposed anesthesia with the patient or authorized representative who has indicated his/her understanding and acceptance.   Dental advisory given  Plan Discussed with: CRNA and Surgeon  Anesthesia Plan Comments:         Anesthesia Quick Evaluation

## 2018-05-09 NOTE — Anesthesia Postprocedure Evaluation (Signed)
Anesthesia Post Note  Patient: Rickey Cochran  Procedure(s) Performed: RIGHT THYROID LOBECTOMY (Right Neck)     Patient location during evaluation: PACU Anesthesia Type: General Level of consciousness: awake and alert Pain management: pain level controlled Vital Signs Assessment: post-procedure vital signs reviewed and stable Respiratory status: spontaneous breathing, nonlabored ventilation, respiratory function stable and patient connected to nasal cannula oxygen Cardiovascular status: blood pressure returned to baseline and stable Postop Assessment: no apparent nausea or vomiting Anesthetic complications: no    Last Vitals:  Vitals:   05/09/18 1439 05/09/18 1503  BP: 123/78 134/88  Pulse:  (!) 51  Resp:  16  Temp:  36.9 C  SpO2:  97%    Last Pain:  Vitals:   05/09/18 1510  TempSrc:   PainSc: 0-No pain                 Damita Eppard S

## 2018-05-09 NOTE — Discharge Instructions (Signed)
Thyroidectomy, Care After °Refer to this sheet in the next few weeks. These instructions provide you with information about caring for yourself after your procedure. Your health care provider may also give you more specific instructions. Your treatment has been planned according to current medical practices, but problems sometimes occur. Call your health care provider if you have any problems or questions after your procedure. °What can I expect after the procedure? °After your procedure, it is typical to have: °· Mild pain in the neck or upper body, especially when swallowing. °· A sore throat. °· A weak voice. ° °Follow these instructions at home: °· Take medicines only as directed by your health care provider. °· If your entire thyroid gland was removed, you may need to take thyroid hormone medicine from now on. °· Do not take medicines that contain aspirin and ibuprofen until your health care provider says that you can. These medicines can increase your risk of bleeding. °· Some pain medicines cause constipation. Drink enough fluid to keep your urine clear or pale yellow. This can help to prevent constipation. °· Start slowly with eating. You may need to have only liquids and soft foods for a few days or as directed by your health care provider. °· Do not take baths, swim, or use a hot tub until your health care provider approves. °· There are many different ways to close and cover an incision, including stitches (sutures), skin glue, and adhesive strips. Follow your health care provider's instructions for: °? Incision care. °? Bandage (dressing) changes and removal. °? Incision closure removal. °· Resume your usual activities as directed by your health care provider. °· For the first 10 days after the procedure or as instructed by your health care provider: °? Do not lift anything heavier than 20 lb (9.1 kg). °? Do not jog, swim, or do other strenuous exercises. °? Do not play contact sports. °· Keep all  follow-up visits as directed by your health care provider. This is important. °Contact a health care provider if: °· The soreness in your throat gets worse. °· You have increased pain at your incision or incisions. °· You have increased bleeding from an incision. °· Your incision becomes infected. Watch for: °? Swelling. °? Redness. °? Warmth. °? Pus. °· You notice a bad smell coming from an incision or dressing. °· You have a fever. °· You feel lightheaded or faint. °· You have numbness, tingling, or muscle spasms in your: °? Arms. °? Hands. °? Feet. °? Face. °· You have trouble swallowing. °Get help right away if: °· You develop a rash. °· You have difficulty breathing. °· You hear whistling noises coming from your chest. °· You develop a cough that gets worse. °· Your speech changes, or you have hoarseness that gets worse. °This information is not intended to replace advice given to you by your health care provider. Make sure you discuss any questions you have with your health care provider. °Document Released: 05/28/2005 Document Revised: 07/11/2016 Document Reviewed: 04/10/2014 °Elsevier Interactive Patient Education © 2018 [employer] ° °

## 2018-05-09 NOTE — Anesthesia Procedure Notes (Signed)
Procedure Name: Intubation Date/Time: 05/09/2018 9:58 AM Performed by: Inda Coke, CRNA Pre-anesthesia Checklist: Patient identified, Emergency Drugs available, Suction available and Patient being monitored Patient Re-evaluated:Patient Re-evaluated prior to induction Oxygen Delivery Method: Circle System Utilized Preoxygenation: Pre-oxygenation with 100% oxygen Induction Type: IV induction Ventilation: Mask ventilation without difficulty and Oral airway inserted - appropriate to patient size Laryngoscope Size: Mac and 3 Grade View: Grade I Tube type: Oral Tube size: 7.5 mm Number of attempts: 1 Airway Equipment and Method: Stylet and Oral airway Placement Confirmation: ETT inserted through vocal cords under direct vision,  positive ETCO2 and breath sounds checked- equal and bilateral Secured at: 22 cm Tube secured with: Tape Dental Injury: Teeth and Oropharynx as per pre-operative assessment  Comments: Performed by Julianne Rice, SRNA

## 2018-05-09 NOTE — Interval H&P Note (Signed)
History and Physical Interval Note:  05/09/2018 9:20 AM  Rickey Cochran  has presented today for surgery, with the diagnosis of right thyroid nodule  The various methods of treatment have been discussed with the patient and family. After consideration of risks, benefits and other options for treatment, the patient has consented to  Procedure(s): RIGHT THYROID LOBECTOMY (Right) as a surgical intervention .  The patient's history has been reviewed, patient examined, no change in status, stable for surgery.  I have reviewed the patient's chart and labs.  Questions were answered to the patient's satisfaction.     Rosario Jacks., Anne Hahn

## 2018-05-09 NOTE — Transfer of Care (Signed)
Immediate Anesthesia Transfer of Care Note  Patient: Rickey Cochran  Procedure(s) Performed: RIGHT THYROID LOBECTOMY (Right Neck)  Patient Location: PACU  Anesthesia Type:General  Level of Consciousness: awake and alert   Airway & Oxygen Therapy: Patient Spontanous Breathing and Patient connected to nasal cannula oxygen  Post-op Assessment: Report given to RN, Post -op Vital signs reviewed and stable and Patient moving all extremities X 4  Post vital signs: Reviewed and stable  Last Vitals:  Vitals Value Taken Time  BP 148/89 05/09/2018 11:29 AM  Temp 36.2 C 05/09/2018 11:29 AM  Pulse 66 05/09/2018 11:31 AM  Resp 14 05/09/2018 11:31 AM  SpO2 97 % 05/09/2018 11:31 AM  Vitals shown include unvalidated device data.  Last Pain:  Vitals:   05/09/18 1129  TempSrc:   PainSc: 3       Patients Stated Pain Goal: 3 (52/08/02 2336)  Complications: No apparent anesthesia complications

## 2018-05-09 NOTE — Plan of Care (Signed)
  Problem: Clinical Measurements: Goal: Complications related to the disease process, condition or treatment will be avoided or minimized Outcome: Progressing   Problem: Respiratory: Goal: Ability to maintain a clear airway will improve Outcome: Progressing   

## 2018-05-09 NOTE — Op Note (Signed)
05/09/2018  11:17 AM  PATIENT:  Rickey Cochran  57 y.o. male  PRE-OPERATIVE DIAGNOSIS:  right thyroid nodule  POST-OPERATIVE DIAGNOSIS:  right thyroid nodule  PROCEDURE:  Procedure(s): RIGHT THYROID LOBECTOMY (Right)  SURGEON:  Surgeon(s) and Role:    * Ralene Ok, MD - Primary    * Armandina Gemma, MD - Assisting   ANESTHESIA:   local and general  EBL:  minimal   BLOOD ADMINISTERED:none  DRAINS: none   LOCAL MEDICATIONS USED:  BUPIVICAINE   SPECIMEN:  Source of Specimen:  Right thyroid lobe with  Stitch marking the superior lobe  DISPOSITION OF SPECIMEN:  PATHOLOGY  COUNTS:  YES  TOURNIQUET:  * No tourniquets in log *  DICTATION: .Dragon Dictation  Findings: Large right thyroid nodule, soft, small area mid thyroid area that appeared to be parathyroid and was left.  Small left upper lobe nodule soft  Indications for procedure: The patient is a 67M who had a right thyroid nodule and was biopsied and was seen to have follicular neoplasm per pathology.  Details of the procedure:  The patient was taken back to the operating room. The patient was placed in supine position with bilateral SCDs in place.  The patient was prepped and draped in the usual sterile fashion. After appropriate anitbiotics were confirmed, a time-out was confirmed and all facts were verified.   A 4 cm incision was made approximately 2 fingerbreadths above the sternal notch. Bovie cautery was used to maintain hemostasis dissection was carried down through the platysma. The platysma was elevated and flaps were created superiorly and inferiorly to the thyroid cartilage as well as the sternal notch, repsectively. The strap muscles were identified in the midline and separated. Right-sided strap muscles were elevated off the anterior surface of the thyroid. This dissection was carried laterally. The middle thyroid vein was identified and doubly ligated. We proceeded to dissect away the superior lobe and  Kitners were used to gently dissect the surrounding musculature from the thyroid. The superior thyroid vessels were identified and doubly ligated with clips and transected with a Harmonic scalpel. At this time this freed up the superior lobe was able to deliver this into the wound. We also identified the superior parathyroid gland that was near the middle thyroid artery, and we preserved it.  We continued to dissect the thyroid off of the trachea from lateral to medial direction. The right recurrent laryngeal nerve was not identified, however dissection was kept right on the thyroid lobe.  The inferior thyroid vessels were identified ligated with clips. At this time Berry's ligament was dissected away from the trachea. This delivered the right lobe of the thyroid into the wound and the harmonic scalpel was used to divide the thyroid in the midline. A superior stitch was then placed in the superior thyroid lobe.   The area was irrigated out. The dissection bed was hemostatic. We placed fibrillar hemostatic agent into the wound. Strap muscles were then reapproximated in the midline with interrupted 3-0 Vicryl stitches. The platysma was reapproximated using 3-0 Vicryl stitches in interrupted fashion. Skin was then reapproximated using a running subcuticular 4-0 Monocryl. The skin was then dressed with Dermabond. The patient was taken to the recovery room in stable condition.    PLAN OF CARE: Admit for overnight observation  PATIENT DISPOSITION:  PACU - hemodynamically stable.   Delay start of Pharmacological VTE agent (>24hrs) due to surgical blood loss or risk of bleeding: yes

## 2018-05-10 ENCOUNTER — Encounter (HOSPITAL_COMMUNITY): Payer: Self-pay | Admitting: General Surgery

## 2018-05-10 ENCOUNTER — Other Ambulatory Visit: Payer: Self-pay

## 2018-05-10 DIAGNOSIS — C73 Malignant neoplasm of thyroid gland: Secondary | ICD-10-CM | POA: Diagnosis not present

## 2018-05-10 NOTE — Discharge Summary (Signed)
Physician Discharge Summary  Patient ID: Rickey Cochran MRN: [id number] DOB/AGE: 28-Mar-1961 57 y.o.  Admit date: 05/09/2018 Discharge date: 05/10/2018  Admission Diagnoses:s/p right thyroid lobectomy  Discharge Diagnoses:  Active Problems:   S/P partial thyroidectomy   Discharged Condition: good  Hospital Course: Pt underwent right thyroid lobectomy.  Please see op note for full details.  Post op pt was sent to the floor and had good pain control.  He was started on a liquid diet and tolerated that well.  He was ambulating well on his own.  He was deemed stable for DC and Dc'd home.  Consults: None  Significant Diagnostic Studies: none  Treatments: surgery: as above  Discharge Exam: Blood pressure 132/70, pulse (!) 57, temperature 97.8 F (36.6 C), temperature source Oral, resp. rate 17, height 6\' 2"  (1.88 m), weight 95.9 kg (211 lb 6.7 oz), SpO2 98 %. Incision/Wound: c/d/i, min edema  Disposition: Discharge disposition: 01-Home or Self Care       Discharge Instructions    Diet - low sodium heart healthy   Complete by:  As directed    Increase activity slowly   Complete by:  As directed      Allergies as of 05/10/2018   No Known Allergies     Medication List    TAKE these medications   COENZYME Q10-RED YEAST RICE PO Take 1 capsule by mouth daily with supper.   ibuprofen 200 MG tablet Commonly known as:  ADVIL,MOTRIN Take 400-600 mg by mouth every 8 (eight) hours as needed (for pain.).   multivitamin with minerals Tabs tablet Take 1 tablet by mouth 3 (three) times daily.   sertraline 100 MG tablet Commonly known as:  ZOLOFT Take 100 mg by mouth at bedtime.      Follow-up Information    Ralene Ok, MD. Schedule an appointment as soon as possible for a visit in 2 weeks.   Specialty:  General Surgery Why:  For wound re-check Contact information: [address] [address] [phone]           Signed: Rosario Jacks.,  [facility] 05/10/2018, 6:35 AM

## 2018-06-21 MED FILL — LEVOTHYROXINE 75 MCG TABLET: 75 | 30 days supply | Qty: 30 | Fill #0

## 2018-06-22 ENCOUNTER — Other Ambulatory Visit (HOSPITAL_COMMUNITY)
Admission: RE | Admit: 2018-06-22 | Discharge: 2018-06-22 | Disposition: A | Discharge status: Home / Self Care | Payer: No Typology Code available for payment source | Source: Ambulatory Visit | Attending: General Surgery | Admitting: General Surgery

## 2018-06-22 DIAGNOSIS — E041 Nontoxic single thyroid nodule: Secondary | ICD-10-CM | POA: Insufficient documentation

## 2018-06-23 MED FILL — IBUPROFEN 800 MG TAB: 800 | 30 days supply | Qty: 90 | Fill #0

## 2018-06-23 MED FILL — SERTRALINE HCL 100 MG TAB: 100 | 90 days supply | Qty: 90 | Fill #1

## 2018-07-05 ENCOUNTER — Encounter (HOSPITAL_COMMUNITY): Payer: Self-pay | Admitting: General Surgery

## 2018-07-17 ENCOUNTER — Ambulatory Visit: Payer: No Typology Code available for payment source | Admitting: Sports Medicine

## 2018-07-17 ENCOUNTER — Encounter: Payer: Self-pay | Admitting: Sports Medicine

## 2018-07-17 VITALS — BP 116/72 | Ht 74.0 in | Wt 210.0 lb

## 2018-07-17 DIAGNOSIS — M25552 Pain in left hip: Secondary | ICD-10-CM

## 2018-07-17 NOTE — Progress Notes (Signed)
   Subjective:    Patient ID: Rickey Cochran, male    DOB: Jul 20, 1961, 57 y.o.   MRN: [id number]  HPI  17yoM with pmh of tendinopathy of left gluteus medius presents for evaluation of left hip pain. Pain is located along posterior aspect of hip. Pain is described as achy, 5/10 that radiates anteriorly but not into groin. No further radiation down left leg. Patient does not take medications for pain.  Pain resolves with rest.  Of note, patient is a marathon runner. Most recently ran 1/2 marathon on 07/09/2018. Reports soreness in hip worsened after this run. He reports his pain continued to progress. He can still run 25-30 miles a week, however, he reports above pain after runs. Most recent run was yesterday which was 6 miles.  Reports pain was 5/10 last night after his run. Pain has improved since last night w/o medications. He denies any trauma or weakness in LE.  Of note, patient reports seeing Dr. Rex Kras (PCP) about 3 weeks ago at which point X ray of left hip was obtained. Per patient, X ray showed mild arthritis.     Review of Systems As per above. I have reviewed patients pmh, medications, and imaging.     Objective:   Physical Exam G: patient is alert, pleasant, and in no acute distress P: No conversational dyspnea MSK:   LEFT HIP:  Normal appearance. ROM intact. + tenderness long posterior hip near gluteus medius insertion site. 5/5 strength in flexion, extension, adduction, abduction. 3-4/5 gluteus medius test. + Trendelenburg test on left leg. Sensation intact.   RIGHT HIP: Normal appearance. ROM intact. + tenderness long posterior hip near gluteus medius insertion site. 5/5 strength in flexion, extension, adduction, abduction. 4/5 gluteus medius test. Negative Trendelenburg test on left leg. Sensation intact.   Neuro: Sensation intact in LE bilaterally. Overall normal appearing gait.   [place] of left hip (08/2016): ultrasound was consistent with calcific tendinopathy of the gluteus  medius that looks improved but also of the rectus femoris and that looks more acute.     Assessment & Plan:  Left Gluteus Medius weakness  Due to history, physical exam, and previous imaging, I do not believe this is OA. Since patient recently had X ray of left hip, I have asked patient to bring copy of CD in order for me to evaluate imaging.  Sxs, however, are most likely due to weakness in left Gluteus medius. We discussed the role of home cross training program to strength gluteus medius muscle bilaterally. We agree to hold off on running for the next 4 weeks until he completes cross training program. If after the 4 weeks patient's symptoms have improved, then he can go on short light runs until our follow up.  Plan to follow up in 6 weeks.  If symptoms do not improve, then consider updated imaging.

## 2018-07-19 MED FILL — LEVOTHYROXINE 75 MCG TABLET: 75 | 30 days supply | Qty: 30 | Fill #1

## 2018-08-14 MED FILL — LEVOTHYROXINE 100 MCG TAB: 100 | 30 days supply | Qty: 30 | Fill #0

## 2018-08-17 ENCOUNTER — Other Ambulatory Visit: Payer: Self-pay | Admitting: Internal Medicine

## 2018-08-17 DIAGNOSIS — C73 Malignant neoplasm of thyroid gland: Secondary | ICD-10-CM

## 2018-08-17 DIAGNOSIS — E042 Nontoxic multinodular goiter: Secondary | ICD-10-CM

## 2018-08-18 ENCOUNTER — Other Ambulatory Visit: Payer: Self-pay | Admitting: Internal Medicine

## 2018-08-18 DIAGNOSIS — E042 Nontoxic multinodular goiter: Secondary | ICD-10-CM

## 2018-08-18 DIAGNOSIS — C73 Malignant neoplasm of thyroid gland: Secondary | ICD-10-CM

## 2018-08-22 ENCOUNTER — Ambulatory Visit: Payer: No Typology Code available for payment source | Admitting: Sports Medicine

## 2018-08-22 ENCOUNTER — Encounter: Payer: Self-pay | Admitting: Sports Medicine

## 2018-08-22 VITALS — BP 120/91 | Ht 74.0 in | Wt 210.0 lb

## 2018-08-22 DIAGNOSIS — M25552 Pain in left hip: Secondary | ICD-10-CM

## 2018-08-23 NOTE — Progress Notes (Signed)
   Subjective:    Patient ID: Rickey Cochran, male    DOB: 01/03/1961, 57 y.o.   MRN: [id number]  HPI   Rickey Cochran comes in today for follow-up on left hip pain.  He is doing much better.  He has been compliant with his home exercise program.  Although he has not returned to running yet, he did an extensive hike over the weekend with minimal discomfort.  Main pain is still with going up hill.  His nighttime pain has resolved.  He continues to deny groin pain.  He is pleased with his progress to date.   Review of Systems    As above Objective:   Physical Exam  Well-developed, well-nourished.  No acute distress.  Awake alert and oriented x3.  Vital signs reviewed  Left hip: Smooth painless hip range of motion with a negative logroll.  Patient has no tenderness to palpation along the greater trochanter nor along the area of the gluteus medius insertion.  His hip abductor strength has improved significantly and he has a negative Trendelenburg.  Neurovascularly intact distally.  Evaluation of his feet in the standing position does show pes planus but evaluation of his running gait shows a fairly neutral gait with his current running shoe.  He runs without a limp.      Assessment & Plan:   Improved left hip pain secondary to gluteus medius weakness and chronic gluteus medius tendinopathy  Patient is doing much better.  I encouraged him to continue with his home exercises and make them part of his weekly workout.  He may continue to increase his activity as tolerated and will follow up with me as needed.

## 2018-08-25 ENCOUNTER — Ambulatory Visit
Admission: RE | Admit: 2018-08-25 | Discharge: 2018-08-25 | Disposition: A | Discharge status: Home / Self Care | Payer: No Typology Code available for payment source | Source: Ambulatory Visit | Attending: Internal Medicine | Admitting: Internal Medicine

## 2018-08-25 DIAGNOSIS — C73 Malignant neoplasm of thyroid gland: Secondary | ICD-10-CM

## 2018-08-25 DIAGNOSIS — E042 Nontoxic multinodular goiter: Secondary | ICD-10-CM

## 2018-09-01 ENCOUNTER — Other Ambulatory Visit: Payer: Self-pay | Admitting: Internal Medicine

## 2018-09-04 ENCOUNTER — Other Ambulatory Visit: Payer: Self-pay | Admitting: Internal Medicine

## 2018-09-04 DIAGNOSIS — E041 Nontoxic single thyroid nodule: Secondary | ICD-10-CM

## 2018-09-05 ENCOUNTER — Other Ambulatory Visit: Payer: Self-pay | Admitting: Internal Medicine

## 2018-09-05 DIAGNOSIS — E890 Postprocedural hypothyroidism: Secondary | ICD-10-CM

## 2018-09-05 DIAGNOSIS — Z9009 Acquired absence of other part of head and neck: Secondary | ICD-10-CM

## 2018-09-12 ENCOUNTER — Ambulatory Visit
Admission: RE | Admit: 2018-09-12 | Discharge: 2018-09-12 | Disposition: A | Discharge status: Home / Self Care | Payer: No Typology Code available for payment source | Source: Ambulatory Visit | Attending: Internal Medicine | Admitting: Internal Medicine

## 2018-09-12 ENCOUNTER — Other Ambulatory Visit (HOSPITAL_COMMUNITY)
Admission: RE | Admit: 2018-09-12 | Discharge: 2018-09-12 | Disposition: A | Discharge status: Home / Self Care | Payer: No Typology Code available for payment source | Source: Ambulatory Visit | Attending: Student | Admitting: Student

## 2018-09-12 DIAGNOSIS — E041 Nontoxic single thyroid nodule: Secondary | ICD-10-CM | POA: Insufficient documentation

## 2018-09-12 DIAGNOSIS — Z9009 Acquired absence of other part of head and neck: Secondary | ICD-10-CM

## 2018-09-12 DIAGNOSIS — E890 Postprocedural hypothyroidism: Secondary | ICD-10-CM

## 2018-09-22 LAB — LIPID PANEL
Cholesterol: 206 — AB (ref 0–200)
HDL: 48 (ref 35–70)
LDL Cholesterol: 126
Triglycerides: 159 (ref 40–160)

## 2018-10-05 MED FILL — LEVOTHYROXINE 100 MCG TAB: 100 | 30 days supply | Qty: 30 | Fill #1

## 2018-10-05 MED FILL — SERTRALINE HCL 100 MG TAB: 100 | 90 days supply | Qty: 90 | Fill #0

## 2018-11-28 MED FILL — LEVOTHYROXINE 75 MCG TABLET: 75 | 30 days supply | Qty: 15 | Fill #0

## 2018-11-28 MED FILL — LEVOTHYROXINE 100 MCG TAB: 100 | 30 days supply | Qty: 15 | Fill #0

## 2018-12-18 MED FILL — IBUPROFEN 800 MG TAB: 800 | 30 days supply | Qty: 90 | Fill #1

## 2018-12-26 MED FILL — LEVOTHYROXINE 100 MCG TAB: 100 | 30 days supply | Qty: 15 | Fill #1

## 2018-12-26 MED FILL — LEVOTHYROXINE 75 MCG TABLET: 75 | 30 days supply | Qty: 15 | Fill #1

## 2019-01-04 MED FILL — SERTRALINE HCL 100 MG TAB: 100 | 90 days supply | Qty: 90 | Fill #1

## 2019-01-23 ENCOUNTER — Ambulatory Visit: Payer: No Typology Code available for payment source | Admitting: Sports Medicine

## 2019-01-23 ENCOUNTER — Ambulatory Visit
Admission: RE | Admit: 2019-01-23 | Discharge: 2019-01-23 | Disposition: A | Discharge status: Home / Self Care | Payer: No Typology Code available for payment source | Source: Ambulatory Visit | Attending: Sports Medicine | Admitting: Sports Medicine

## 2019-01-23 VITALS — BP 124/82 | Ht 74.0 in | Wt 210.0 lb

## 2019-01-23 DIAGNOSIS — M25551 Pain in right hip: Secondary | ICD-10-CM

## 2019-01-24 ENCOUNTER — Encounter: Payer: Self-pay | Admitting: Sports Medicine

## 2019-01-24 NOTE — Progress Notes (Signed)
   Subjective:    Patient ID: Rickey Cochran, male    DOB: 1961-01-13, 57 y.o.   MRN: [id number]  HPI chief complaint: Right hip pain  Patient comes in today complaining of 3 weeks of right hip pain.  Pain began shortly after a run.  He has been increasing his mileage to train for a half marathon.  Shortly after a run, he began to experience some groin pain which has persisted.  Pain is becoming more constant but is worse with weightbearing.  He is now limping.  He had pain in the left hip several months ago as a result of pelvic stabilizer weakness but that pain resolved with home exercises.  The pain in his right hip is different in nature than what he experienced in the left hip.  He did try to do some of these same home exercises for the right hip but it made his pain worse.  Pain will radiate around the entire hip including to the low back.  He denies radiating pain down the leg.  No numbness or tingling.  In addition to increasing pain with weightbearing he also notes significant pain with hip flexion.  He has been taking intermittent doses of over-the-counter ibuprofen but this has not been helpful.  Interim medical history reviewed Medications reviewed Allergies reviewed   Review of Systems As above    Objective:   Physical Exam  Well-developed, well-nourished.  No acute distress.  Awake alert and oriented x3.  Vital signs reviewed  Right hip: Patient has good hip range of motion but has significant reproducible groin pain with internal rotation.  Pain with deep hip flexion as well.  No tenderness to palpation over the greater trochanter.  Negative straight leg raise.  Good strength.  Neurovascularly intact distally.  Walking with a noticeable limp.      Assessment & Plan:  Right hip pain worrisome for femoral neck stress fracture versus osteoarthritis  I am going to start with getting an x-ray of his right hip specifically to evaluate the degree of osteoarthritis present.  If  fairly unremarkable, I will then proceed with an MRI specifically to rule out a femoral neck stress fracture.  Phone follow-up with those results when available.  In the meantime, patient will be partial weightbearing with crutches.  No running.  We will delineate a more definitive treatment plan after I review his imaging studies.

## 2019-01-29 ENCOUNTER — Other Ambulatory Visit: Payer: No Typology Code available for payment source

## 2019-01-29 ENCOUNTER — Ambulatory Visit
Admission: RE | Admit: 2019-01-29 | Discharge: 2019-01-29 | Disposition: A | Discharge status: Home / Self Care | Payer: No Typology Code available for payment source | Source: Ambulatory Visit | Attending: Sports Medicine | Admitting: Sports Medicine

## 2019-01-29 ENCOUNTER — Other Ambulatory Visit: Payer: Self-pay

## 2019-01-29 DIAGNOSIS — M25551 Pain in right hip: Secondary | ICD-10-CM

## 2019-01-29 MED ORDER — MELOXICAM 15 MG PO TABS: ORAL_TABLET | ORAL | 0 refills | Status: DC

## 2019-01-29 MED FILL — LEVOTHYROXINE 100 MCG TAB: 100 | 30 days supply | Qty: 15 | Fill #2

## 2019-01-29 MED FILL — MELOXICAM 15 MG TABLET: 15 | 40 days supply | Qty: 40 | Fill #0

## 2019-01-29 MED FILL — LEVOTHYROXINE 75 MCG TABLET: 75 | 30 days supply | Qty: 15 | Fill #2

## 2019-01-30 ENCOUNTER — Telehealth: Payer: Self-pay | Admitting: Sports Medicine

## 2019-01-30 NOTE — Telephone Encounter (Signed)
  I spoke with Rickey Cochran on the phone yesterday after reviewing the MRI of his right hip which was done earlier yesterday.  There is no evidence of femoral neck stress fracture.  He has advanced right hip DJD, much more obvious on MRI than on plain x-ray.  He has an associated right hip effusion.  I discussed treatment options including oral anti-inflammatories versus intra-articular cortisone injection.  He would like to try NSAIDs first.  I will put him on meloxicam 15 mg daily for the next 7 days then as needed.  He may discontinue use of his crutches and he will follow-up with me in 3 weeks.  If symptoms persist, reconsider merits of intra-articular cortisone injection.  Of note, incidental finding on the MRI was moderate prostatomegaly.  He will contact his primary care physician, Dr. Rex Kras, to discuss any necessary work-up or treatment.

## 2019-02-09 ENCOUNTER — Ambulatory Visit: Payer: Self-pay

## 2019-02-09 ENCOUNTER — Encounter: Payer: Self-pay | Admitting: Sports Medicine

## 2019-02-09 ENCOUNTER — Other Ambulatory Visit: Payer: Self-pay

## 2019-02-09 ENCOUNTER — Ambulatory Visit: Payer: No Typology Code available for payment source | Admitting: Sports Medicine

## 2019-02-09 VITALS — BP 112/80 | Ht 74.0 in | Wt 210.0 lb

## 2019-02-09 DIAGNOSIS — M169 Osteoarthritis of hip, unspecified: Secondary | ICD-10-CM | POA: Insufficient documentation

## 2019-02-09 DIAGNOSIS — M25551 Pain in right hip: Secondary | ICD-10-CM | POA: Diagnosis not present

## 2019-02-09 DIAGNOSIS — M1611 Unilateral primary osteoarthritis, right hip: Secondary | ICD-10-CM | POA: Diagnosis not present

## 2019-02-09 DIAGNOSIS — M1612 Unilateral primary osteoarthritis, left hip: Secondary | ICD-10-CM | POA: Insufficient documentation

## 2019-02-09 MED ORDER — METHYLPREDNISOLONE ACETATE 40 MG/ML IJ SUSP
40.0000 mg | Freq: Once | INTRAMUSCULAR | Status: AC
  Administered 2019-02-09: 40 mg via INTRA_ARTICULAR

## 2019-02-09 NOTE — Patient Instructions (Addendum)
Today, we reviewed your x-ray and MRI results. 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  Right hip effusion containing some debris compatible with synovitis.  Mild tendinosis of the right hamstrings at their origin. Moderate tendinosis of the left gluteus medius at their insertion on the greater trochanter also noted.  Marked degenerative endplate signal change Q1-J9.  Moderate prostatomegaly. ------------------------------------------------------------------------------------------------  Treatment included a R hip injections. You should start to have relief within 48hrs to 7 days post injection once the steroid kicks in.  Activity modification recommended for the next one week or until your pain has improved significantly. Low impact exercises like biking, elliptical, or swimming are recommended. Avoid dead lifting, squatting, or long runs until pain has improved. When returning to run, I recommend a mild-moderate pace running 1-2 miles initially every other day. Follow up as scheduled with Dr. Micheline Chapman.

## 2019-02-09 NOTE — Progress Notes (Signed)
Rickey Cochran - 58 y.o. male MRN [id number]  Date of birth: [birth year]   Chief complaint: R hip pain  SUBJECTIVE:    History of present illness: 58yo M who presents today with the cc of R hip pain. He was diagnosed with significant R hip osteoarthritis via x-ray and follow up MRI.  He is here today for an ultrasound-guided corticosteroid injection.  He was performing hip strengthening exercises per the recommendations of Dr. Micheline Chapman.  Of note his symptoms initially began in February and have persisted despite conservative therapy.  He states he is ambulating with a mild limp.  He is having trouble sleeping at night.  He has not run in over 3 weeks as well.  His goal is to get back to running a 5K if possible.  Denies any new trauma or new injuries.  Denies any new symptoms of numbness or tingling of the extremity.  No low back pain.  No knee pain or ankle pain associated with his current problem.  He does have a history of left-sided gluteus medius tenderness that he rehabbed last year.   Review of systems:  As stated above   Interval past medical history, surgical history, family history, and social history obtained and are unchanged.   Medications reviewed. Of note, he takes Synthroid, Zoloft, Meloxicam. Allergies reviewed. No known drug allergies.  OBJECTIVE:  Physical exam: Vital signs are reviewed. BP 112/80   Ht 6\' 2"  (1.88 m)   Wt 210 lb (95.3 kg)   BMI 26.96 kg/m   Gen.: Alert, oriented, appears stated age, in no apparent distress Integumentary: No rashes or ecchymosis Neurologic: sensation intact to light touch L4-S1 on the R Gait: significant limp favoring the right side.  His right hip is more accentuated with ambulation with a right-sided side bend of his low back to compensate. Musculoskeletal: Inspection of the R hip demonstrates no acute abnormality.  He has no tenderness palpation over his greater trochanter.  He has limited internal range of motion of his hip  secondary to significant groin pain.  Strength testing is 5 out of 5 in hip flexion and 5 out of 5 in hip abduction.  Positive Stinchfield test.  Positive logroll test.  Discomfort elicited with Fadir testing.  Neurovascularly intact. Back: No midline lumbar spine tenderness no reactive paraspinal tenderness.  Negative straight leg raise bilaterally.  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  Right hip effusion containing some debris compatible with synovitis.  Mild tendinosis of the right hamstrings at their origin. Moderate tendinosis of the left gluteus medius at their insertion on the greater trochanter also noted.  Marked degenerative endplate signal change K1-S0.  Moderate prostatomegaly.   ASSESSMENT & PLAN: OA (osteoarthritis) of hip US guided INJECTION: Patient was given informed consent, signed copy in the chart. Appropriate time out was taken. Area prepped and draped in usual sterile fashion. 2 cc of methylprednisolone 40 mg/ml plus  4 cc of 1% lidocaine without epinephrine was injected into the R intra-articular hip using a(n) anterior, US guided approach. Ultrasound was necessary for safety of the procedure and for proper needle guidance. Images uploaded to the PACs system. Of note, moderate sized R hip effusion noted prior to injection. The patient tolerated the procedure well. There were no complications. Post procedure instructions were given.  Guidelines for return to run given. Low impact activities recommended for  the next 1-2 weeks. NSAIDs prn. F/u with Dr. Micheline Chapman in 3 weeks.  Orders Placed This Encounter  Procedures  . [place] LIMITED JOINT SPACE STRUCTURES LOW RIGHT    Standing Status:   Future    Number of Occurrences:   1    Standing Expiration Date:   04/10/2020    Order Specific Question:   Reason  for Exam (SYMPTOM  OR DIAGNOSIS REQUIRED)    Answer:   right hip pain    Order Specific Question:   Preferred imaging location?    Answer:   Internal     Clydene Laming, [place]

## 2019-02-09 NOTE — Assessment & Plan Note (Signed)
US guided INJECTION: Patient was given informed consent, signed copy in the chart. Appropriate time out was taken. Area prepped and draped in usual sterile fashion. 2 cc of methylprednisolone 40 mg/ml plus  4 cc of 1% lidocaine without epinephrine was injected into the R intra-articular hip using a(n) anterior, US guided approach. Ultrasound was necessary for safety of the procedure and for proper needle guidance. Images uploaded to the PACs system. Of note, moderate sized R hip effusion noted prior to injection. The patient tolerated the procedure well. There were no complications. Post procedure instructions were given.

## 2019-02-14 ENCOUNTER — Telehealth: Payer: Self-pay

## 2019-02-14 DIAGNOSIS — M25551 Pain in right hip: Secondary | ICD-10-CM

## 2019-02-14 NOTE — Telephone Encounter (Signed)
Will refer him to Dr. Ninfa Linden at [place] per Dr. Alinda Money suggestion. Pt agrees with plan. With his insurance policy he will need his PCP to send a referral to Dr. Trevor Mace office as well. Pt is aware he will be getting a call to schedule an appt from [place].

## 2019-02-20 ENCOUNTER — Ambulatory Visit: Payer: No Typology Code available for payment source | Admitting: Sports Medicine

## 2019-02-20 ENCOUNTER — Telehealth (INDEPENDENT_AMBULATORY_CARE_PROVIDER_SITE_OTHER): Payer: Self-pay | Admitting: Radiology

## 2019-02-20 MED FILL — LEVOTHYROXINE 75 MCG TABLET: 75 | 30 days supply | Qty: 15 | Fill #3

## 2019-02-20 MED FILL — LEVOTHYROXINE 100 MCG TAB: 100 | 30 days supply | Qty: 15 | Fill #3

## 2019-02-20 NOTE — Telephone Encounter (Signed)
LMOM to return call for screening questions. Please ask with return call, thanks!  Do you have now or have you had in the past 7 days a fever and/or chills? Do you have now or have you had in the past 7 days a cough? Do you have now or have you had in the last 7 days nausea, vomiting or abdominal pain? Have you been exposed to anyone who has tested positive for COVID-19? Have you or anyone who lives with you traveled within the last month?

## 2019-02-20 NOTE — Telephone Encounter (Signed)
Patient answered "No" to all the screening questions.

## 2019-02-21 ENCOUNTER — Other Ambulatory Visit: Payer: Self-pay

## 2019-02-21 ENCOUNTER — Ambulatory Visit (INDEPENDENT_AMBULATORY_CARE_PROVIDER_SITE_OTHER): Payer: No Typology Code available for payment source | Admitting: Orthopaedic Surgery

## 2019-02-21 DIAGNOSIS — M1611 Unilateral primary osteoarthritis, right hip: Secondary | ICD-10-CM | POA: Insufficient documentation

## 2019-02-21 NOTE — Progress Notes (Signed)
Office Visit Note   Patient: Rickey Cochran           Date of Birth: 01-22-1961           MRN: [id number] Visit Date: 02/21/2019              Requested by: Thurman Coyer, DO [address] [place], [place] [address] PCP: Hulan Fess, MD   Assessment & Plan: Visit Diagnoses:  1. Unilateral primary osteoarthritis, right hip     Plan: We had a long and thorough discussion about his right hip.  We went over his x-rays and MRI with him in detail and showed him a hip model and explained the disease process that he has going on in his right hip.  At this point, given the severity of his arthritis we are recommending a total hip arthroplasty through direct anterior approach.  I spent a long amount of time in the office showing him his x-rays and going over what the surgery involves including a risk and benefits assessment.  We talked about the intraoperative and postoperative course and what to expect.  All questions concerns were answered and addressed.  We will work on getting this scheduled once the coronavirus pandemic has stabilized to the point that we can place people on the schedule for elective total joints.  He understands this as well.  He is going to try cane in his opposite hand and a regimen of anti-inflammatories and over-the-counter supplements.  Follow-Up Instructions: Return for 2 weeks post-op.   Orders:  No orders of the defined types were placed in this encounter.  No orders of the defined types were placed in this encounter.     Procedures: No procedures performed   Clinical Data: No additional findings.   Subjective: Chief Complaint  Patient presents with  . Right Hip - Pain  The patient is someone I am seeing for the first time.  He is a very pleasant and active 58 year old gentleman with a several month history of severe worsening right hip pain.  He is a very outgoing individual who does like to hike a lot and he has been running some as well.   He developed right hip and groin pain several months ago and it got to be quite severe.  He is always had a problem with that hip being more stiff with crossing his legs and he does state that his mother has significant arthritis in her joints and has had multiple joint replacements.  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  He has seen Dr. Micheline Chapman who appropriately ordered an MRI of his right hip after plain films did not show any significant hip disease.  After the MRI showed a severe arthritic process in the right hip, an intra-articular injection was placed under ultrasound which gave him significant relief.  He still reports pain in that right hip and stiffness and is causing him to have problems sleeping at night.  At this point his pain can be 10-10 at times.  It is detrimentally affect his mobility, his quality of life and his activities daily living.  He is otherwise very healthy 58 year old gentleman.  He is trying a regimen of anti-inflammatories and is stopped high impact aerobic activities as well.  He is not obese.  He is not diabetic and not a smoker.  HPI  Review of Systems He currently  denies any headache, chest pain, shortness of breath, fever, chills, nausea, vomiting.  Objective: Vital Signs: There were no vitals taken for this visit.  Physical Exam He is alert and orient x3 and in no acute distress Ortho Exam Examination of his left hip is normal examination of the right hip shows significant limitations with internal and external rotation with stiffness and severe pain. Specialty Comments:  No specialty comments available.  Imaging: No results found. Independent review of plain films and the MRI of his right hip was made and significant arthritis can be seen of the right hip.  There is a large area of subchondral edema in the femoral head.  There is degenerative labral tearing.  There is no hip  joint effusion.  There is areas of full-thickness cartilage loss in the femoral head.  PMFS History: Patient Active Problem List   Diagnosis Date Noted  . Unilateral primary osteoarthritis, right hip 02/21/2019  . OA (osteoarthritis) of hip 02/09/2019  . S/P partial thyroidectomy 05/09/2018  . Right hip pain 06/30/2016  . Left hamstring muscle strain 09/11/2015  . Pes planus of both feet 09/11/2015   Past Medical History:  Diagnosis Date  . Depression   . Hypertension     No family history on file.  Past Surgical History:  Procedure Laterality Date  . THYROID LOBECTOMY Right 05/09/2018  . THYROID LOBECTOMY Right 05/09/2018   Procedure: RIGHT THYROID LOBECTOMY;  Surgeon: Ralene Ok, MD;  Location: [facility];  Service: General;  Laterality: Right;   Social History   Occupational History  . Not on file  Tobacco Use  . Smoking status: Former Research scientist (life sciences)  . Smokeless tobacco: Never Used  . Tobacco comment: QUIT IN 2004  Substance and Sexual Activity  . Alcohol use: Not Currently    Alcohol/week: 0.0 standard drinks    Comment: quit  . Drug use: Never  . Sexual activity: Not on file

## 2019-03-28 ENCOUNTER — Other Ambulatory Visit: Payer: Self-pay | Admitting: Physician Assistant

## 2019-03-28 MED FILL — AMOX-CLAV 875-125 MG TABLET: 875-125 | 7 days supply | Qty: 14 | Fill #0

## 2019-03-30 ENCOUNTER — Other Ambulatory Visit: Payer: Self-pay

## 2019-03-30 MED FILL — SERTRALINE HCL 100 MG TAB: 100 | 90 days supply | Qty: 90 | Fill #2

## 2019-04-04 MED FILL — LEVOTHYROXINE 75 MCG TABLET: 75 | 30 days supply | Qty: 15 | Fill #4

## 2019-04-04 MED FILL — LEVOTHYROXINE 100 MCG TAB: 100 | 30 days supply | Qty: 15 | Fill #4

## 2019-04-04 NOTE — Patient Instructions (Addendum)
Rickey Cochran  04/04/2019    YOU ARE REQUIRED TO BE TESTED FOR COVID-19 PRIOR TO YOUR SURGERY . YOUR TEST MUST BE COMPLETED ON Tuesday Apr 10, 2019. TESTING IS LOCATED AT [place] ENTRANCE FROM 9:00AM - 3:00PM. FAILURE TO COMPLETE TESTING MAY RESULT IN CANCELLATION OF YOUR SURGERY.    __________________________________________________________________________________________________   Your procedure is scheduled on: 04-13-2019  Report to [facility] Main  Entrance    Report to admitting at 7:00AM      Call this number if you have problems the morning of surgery [phone]     Remember: Do not eat food After Midnight. YOU MAY HAVE CLEAR LIQUIDS FROM MIDNIGHT UNTIL 4:30AM. At 4:30AM Please finish the prescribed Pre-Surgery ENSURE drink. Nothing by mouth after you finish the ENSURE drink !   BRUSH YOUR TEETH MORNING OF SURGERY AND RINSE YOUR MOUTH OUT, NO CHEWING GUM CANDY OR MINTS.      CLEAR LIQUID DIET   Foods Allowed                                                                     Foods Excluded  Coffee and tea, regular and decaf                             liquids that you cannot  Plain Jell-O in any flavor                                             see through such as: Fruit ices (not with fruit pulp)                                     milk, soups, orange juice  Iced Popsicles                                    All solid food Carbonated beverages, regular and diet                                    Cranberry, grape and apple juices Sports drinks like Gatorade Lightly seasoned clear broth or consume(fat free) Sugar, honey syrup  Sample Menu Breakfast                                Lunch                                     Supper Cranberry juice                    Beef broth  Chicken broth Jell-O                                     Grape juice                           Apple  juice Coffee or tea                        Jell-O                                      Popsicle                                                Coffee or tea                        Coffee or tea  _____________________________________________________________________       Take these medicines the morning of surgery with A SIP OF WATER: levothyroxine                                 You may not have any metal on your body including hair pins and              piercings  Do not wear jewelry, make-up, lotions, powders or perfumes, deodorant                  Men may shave face and neck.   Do not bring valuables to the hospital. [place].  Contacts, dentures or bridgework may not be worn into surgery.                Please read over the following fact sheets you were given: _____________________________________________________________________             [facility] - Preparing for Surgery Before surgery, you can play an important role.  Because skin is not sterile, your skin needs to be as free of germs as possible.  You can reduce the number of germs on your skin by washing with CHG (chlorahexidine gluconate) soap before surgery.  CHG is an antiseptic cleaner which kills germs and bonds with the skin to continue killing germs even after washing. Please DO NOT use if you have an allergy to CHG or antibacterial soaps.  If your skin becomes reddened/irritated stop using the CHG and inform your nurse when you arrive at Short Stay. Do not shave (including legs and underarms) for at least 48 hours prior to the first CHG shower.  You may shave your face/neck. Please follow these instructions carefully:  1.  Shower with CHG Soap the night before surgery and the  morning of Surgery.  2.  If you choose to wash your hair, wash your hair first as usual with your  normal  shampoo.  3.  After you shampoo, rinse your hair and body thoroughly to remove  the  shampoo.  4.  Use CHG as you would any other liquid soap.  You can apply chg directly  to the skin and wash                       Gently with a scrungie or clean washcloth.  5.  Apply the CHG Soap to your body ONLY FROM THE NECK DOWN.   Do not use on face/ open                           Wound or open sores. Avoid contact with eyes, ears mouth and genitals (private parts).                       Wash face,  Genitals (private parts) with your normal soap.             6.  Wash thoroughly, paying special attention to the area where your surgery  will be performed.  7.  Thoroughly rinse your body with warm water from the neck down.  8.  DO NOT shower/wash with your normal soap after using and rinsing off  the CHG Soap.                9.  Pat yourself dry with a clean towel.            10.  Wear clean pajamas.            11.  Place clean sheets on your bed the night of your first shower and do not  sleep with pets. Day of Surgery : Do not apply any lotions/deodorants the morning of surgery.  Please wear clean clothes to the hospital/surgery center.  FAILURE TO FOLLOW THESE INSTRUCTIONS MAY RESULT IN THE CANCELLATION OF YOUR SURGERY PATIENT SIGNATURE_________________________________  NURSE SIGNATURE__________________________________  ________________________________________________________________________   [name]  An incentive spirometer is a tool that can help keep your lungs clear and active. This tool measures how well you are filling your lungs with each breath. Taking long deep breaths may help reverse or decrease the chance of developing breathing (pulmonary) problems (especially infection) following:  A long period of time when you are unable to move or be active. BEFORE THE PROCEDURE   If the spirometer includes an indicator to show your best effort, your nurse or respiratory therapist will set it to a desired goal.  If possible, sit up  straight or lean slightly forward. Try not to slouch.  Hold the incentive spirometer in an upright position. INSTRUCTIONS FOR USE  1. Sit on the edge of your bed if possible, or sit up as far as you can in bed or on a chair. 2. Hold the incentive spirometer in an upright position. 3. Breathe out normally. 4. Place the mouthpiece in your mouth and seal your lips tightly around it. 5. Breathe in slowly and as deeply as possible, raising the piston or the ball toward the top of the column. 6. Hold your breath for 3-5 seconds or for as long as possible. Allow the piston or ball to fall to the bottom of the column. 7. Remove the mouthpiece from your mouth and breathe out normally. 8. Rest for a few seconds and repeat Steps 1 through 7 at least 10 times every 1-2 hours when you are awake. Take your time and take a few normal breaths between deep breaths. 9. The spirometer may include an indicator to show  your best effort. Use the indicator as a goal to work toward during each repetition. 10. After each set of 10 deep breaths, practice coughing to be sure your lungs are clear. If you have an incision (the cut made at the time of surgery), support your incision when coughing by placing a pillow or rolled up towels firmly against it. Once you are able to get out of bed, walk around indoors and cough well. You may stop using the incentive spirometer when instructed by your caregiver.  RISKS AND COMPLICATIONS  Take your time so you do not get dizzy or light-headed.  If you are in pain, you may need to take or ask for pain medication before doing incentive spirometry. It is harder to take a deep breath if you are having pain. AFTER USE  Rest and breathe slowly and easily.  It can be helpful to keep track of a log of your progress. Your caregiver can provide you with a simple table to help with this. If you are using the spirometer at home, follow these instructions: [place] IF:   You are  having difficultly using the spirometer.  You have trouble using the spirometer as often as instructed.  Your pain medication is not giving enough relief while using the spirometer.  You develop fever of 100.5 F (38.1 C) or higher. SEEK IMMEDIATE MEDICAL CARE IF:   You cough up bloody sputum that had not been present before.  You develop fever of 102 F (38.9 C) or greater.  You develop worsening pain at or near the incision site. MAKE SURE YOU:   Understand these instructions.  Will watch your condition.  Will get help right away if you are not doing well or get worse. Document Released: 03/21/2007 Document Revised: 01/31/2012 Document Reviewed: 05/22/2007 [facility] Patient Information 2014 [place], [place].   ________________________________________________________________________

## 2019-04-04 NOTE — Progress Notes (Signed)
CXR 04-24-18 Epic

## 2019-04-05 ENCOUNTER — Other Ambulatory Visit: Payer: Self-pay

## 2019-04-05 ENCOUNTER — Encounter (HOSPITAL_COMMUNITY): Payer: Self-pay

## 2019-04-05 ENCOUNTER — Telehealth: Payer: Self-pay | Admitting: Orthopaedic Surgery

## 2019-04-05 ENCOUNTER — Encounter (HOSPITAL_COMMUNITY)
Admission: RE | Admit: 2019-04-05 | Discharge: 2019-04-05 | Disposition: A | Discharge status: Home / Self Care | Payer: No Typology Code available for payment source | Source: Ambulatory Visit | Attending: Orthopaedic Surgery | Admitting: Orthopaedic Surgery

## 2019-04-05 ENCOUNTER — Telehealth: Payer: Self-pay | Admitting: Internal Medicine

## 2019-04-05 DIAGNOSIS — M1611 Unilateral primary osteoarthritis, right hip: Secondary | ICD-10-CM | POA: Diagnosis not present

## 2019-04-05 DIAGNOSIS — I10 Essential (primary) hypertension: Secondary | ICD-10-CM | POA: Diagnosis not present

## 2019-04-05 DIAGNOSIS — Z01818 Encounter for other preprocedural examination: Secondary | ICD-10-CM | POA: Insufficient documentation

## 2019-04-05 HISTORY — DX: Tinnitus, left ear: H93.12

## 2019-04-05 HISTORY — DX: Malignant (primary) neoplasm, unspecified: C80.1

## 2019-04-05 HISTORY — DX: Unspecified osteoarthritis, unspecified site: M19.90

## 2019-04-05 HISTORY — DX: Hypothyroidism, unspecified: E03.9

## 2019-04-05 HISTORY — DX: Atrial premature depolarization: I49.1

## 2019-04-05 LAB — CBC
HCT: 45.2 % (ref 39.0–52.0)
Hemoglobin: 15.4 g/dL (ref 13.0–17.0)
MCH: 31.7 pg (ref 26.0–34.0)
MCHC: 34.1 g/dL (ref 30.0–36.0)
MCV: 93 fL (ref 80.0–100.0)
Platelets: 245 10*3/uL (ref 150–400)
RBC: 4.86 MIL/uL (ref 4.22–5.81)
RDW: 12 % (ref 11.5–15.5)
WBC: 6.5 10*3/uL (ref 4.0–10.5)
nRBC: 0 % (ref 0.0–0.2)

## 2019-04-05 LAB — SURGICAL PCR SCREEN
MRSA, PCR: NEGATIVE
Staphylococcus aureus: NEGATIVE

## 2019-04-05 LAB — BASIC METABOLIC PANEL
Anion gap: 9 (ref 5–15)
BUN: 11 mg/dL (ref 6–20)
CO2: 24 mmol/L (ref 22–32)
Calcium: 9.2 mg/dL (ref 8.9–10.3)
Chloride: 107 mmol/L (ref 98–111)
Creatinine, Ser: 0.74 mg/dL (ref 0.61–1.24)
GFR calc Af Amer: >60 mL/min (ref >60)
GFR calc non Af Amer: >60 mL/min (ref >60)
Glucose, Bld: 106 mg/dL — ABNORMAL HIGH (ref 70–99)
Potassium: 4.2 mmol/L (ref 3.5–5.1)
Sodium: 140 mmol/L (ref 135–145)

## 2019-04-05 NOTE — Telephone Encounter (Signed)
Called.

## 2019-04-05 NOTE — Telephone Encounter (Signed)
Patient is requesting to become a new patient of Dr.Jones. He has agreed to accept him.  LVM for patient to call back and set up this NP appointment.

## 2019-04-05 NOTE — Telephone Encounter (Signed)
Patient wanting to get walker after surgery.

## 2019-04-05 NOTE — Telephone Encounter (Signed)
Patient called in wanting to know about after surgery walker was told by staff at [facility] that he needed to contact our office in regards of getting this. Surgery is 04/13/2019. Patient would like a cll back in regards to this

## 2019-04-06 NOTE — Telephone Encounter (Signed)
Appointment has been made for 05/23/19 with Dr.Jones.

## 2019-04-10 ENCOUNTER — Other Ambulatory Visit (HOSPITAL_COMMUNITY)
Admission: RE | Admit: 2019-04-10 | Discharge: 2019-04-10 | Disposition: A | Discharge status: Home / Self Care | Payer: No Typology Code available for payment source | Source: Ambulatory Visit | Attending: Orthopaedic Surgery | Admitting: Orthopaedic Surgery

## 2019-04-10 DIAGNOSIS — Z1159 Encounter for screening for other viral diseases: Secondary | ICD-10-CM | POA: Diagnosis not present

## 2019-04-11 LAB — NOVEL CORONAVIRUS, NAA (HOSP ORDER, SEND-OUT TO REF LAB; TAT 18-24 HRS): SARS-CoV-2, NAA: NOT DETECTED

## 2019-04-12 ENCOUNTER — Other Ambulatory Visit: Payer: Self-pay

## 2019-04-12 ENCOUNTER — Other Ambulatory Visit: Payer: Self-pay | Admitting: *Deleted

## 2019-04-12 ENCOUNTER — Encounter: Payer: Self-pay | Admitting: *Deleted

## 2019-04-12 NOTE — Progress Notes (Signed)
SPOKE W/  _Stephen Witty    SCREENING SYMPTOMS OF COVID 19:  Denies any of the following  COUGH--  RUNNY NOSE---   SORE THROAT---  NASAL CONGESTION----  SNEEZING----  SHORTNESS OF BREATH---  DIFFICULTY BREATHING---  TEMP >100.0 -----  UNEXPLAINED BODY ACHES------  CHILLS --------   HEADACHES ---------  LOSS OF SMELL/ TASTE --------    HAVE YOU OR ANY FAMILY MEMBER TRAVELLED PAST 14 DAYS OUT OF THE   COUNTY--- STATE---- COUNTRY----  HAVE YOU OR ANY FAMILY MEMBER BEEN EXPOSED TO ANYONE WITH COVID 19?

## 2019-04-12 NOTE — Patient Outreach (Signed)
[facility]) Care Management  04/12/2019  Rickey Cochran Dec 01, 1960 [id number]  Preoperative Screening Call Referral received: 04/02/19 Surgery/procedure date: 04/13/19 Insurance: [employer]  Subjective:  Initial successful telephone call to patient's preferred number in order to complete preoperative screening. 2 HIPAA identifiers verified. Discussed purpose of preoperative call. Patient voices understanding and agrees to call. He states she understands the reasons for the surgery and the expected time of arrival. He says he completed his pre-op testing on 04/05/19 and asked about the results of his Novel Coronavirus test and post surgery rehabilitation options. He says he expects to be in the hospital 2-3 days.  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 He says he will have 24/7 care at home provided by his wife, who is a Electrical engineer with [employer], to assist in his/her recovery. He does not have and is not interested in completing advanced directives at this time. He agrees to a post hospital discharge transition of care call.    Objective:  Per chart review, patient scheduled for right total hip arthroplasty anterior approach on 04/13/19 at [facility].   Assessment: Preoperative call completed, no preoperative needs identified.   Plan: Per his request, advised Rickey Cochran his Novel Coronavirus results of 04/05/19 were negative.  Also discussed post surgery rehabilitation options and that the acute care team will answer any questions he has in light of the COVID-19 pandemic restrictions affecting outpatient and in- home rehabilitation programs.  RNCM will call patient for transition of care outreach within 48 hours of hospital discharge notification.  Rickey Ellison RN,CCM,CDE [place] Management Coordinator Office Phone [phone] Office Fax [phone]

## 2019-04-12 NOTE — Anesthesia Preprocedure Evaluation (Addendum)
Anesthesia Evaluation  Patient identified by MRN, date of birth, ID band Patient awake    Reviewed: Allergy & Precautions, NPO status , Patient's Chart, lab work & pertinent test results  Airway Mallampati: II  TM Distance: >3 FB Neck ROM: Full    Dental no notable dental hx. (+) Teeth Intact, Dental Advisory Given   Pulmonary neg pulmonary ROS, former smoker,    Pulmonary exam normal breath sounds clear to auscultation       Cardiovascular hypertension, Pt. on medications Normal cardiovascular exam Rhythm:Regular Rate:Normal     Neuro/Psych Depression    GI/Hepatic   Endo/Other  Hypothyroidism   Renal/GU Cr 0.74 K+ 4.2     Musculoskeletal  (+) Arthritis ,   Abdominal   Peds  Hematology Hgb 15.4 Plt 245   Anesthesia Other Findings   Reproductive/Obstetrics                           Anesthesia Physical Anesthesia Plan  ASA: II  Anesthesia Plan: Spinal   Post-op Pain Management:    Induction:   PONV Risk Score and Plan: 2 and Treatment may vary due to age or medical condition, Dexamethasone and Ondansetron  Airway Management Planned: Simple Face Mask and Natural Airway  Additional Equipment:   Intra-op Plan:   Post-operative Plan:   Informed Consent: I have reviewed the patients History and Physical, chart, labs and discussed the procedure including the risks, benefits and alternatives for the proposed anesthesia with the patient or authorized representative who has indicated his/her understanding and acceptance.     Dental advisory given  Plan Discussed with:   Anesthesia Plan Comments: (R THR w Spinal)        Anesthesia Quick Evaluation

## 2019-04-13 ENCOUNTER — Encounter (HOSPITAL_COMMUNITY): Payer: Self-pay

## 2019-04-13 ENCOUNTER — Inpatient Hospital Stay (HOSPITAL_COMMUNITY): Payer: No Typology Code available for payment source | Admitting: Anesthesiology

## 2019-04-13 ENCOUNTER — Encounter (HOSPITAL_COMMUNITY)
Admission: RE | Disposition: A | Discharge status: Home Health | Payer: Self-pay | Source: Home / Self Care | Attending: Orthopaedic Surgery

## 2019-04-13 ENCOUNTER — Inpatient Hospital Stay (HOSPITAL_COMMUNITY): Payer: No Typology Code available for payment source | Admitting: Physician Assistant

## 2019-04-13 ENCOUNTER — Other Ambulatory Visit: Payer: Self-pay

## 2019-04-13 ENCOUNTER — Inpatient Hospital Stay (HOSPITAL_COMMUNITY): Payer: No Typology Code available for payment source

## 2019-04-13 ENCOUNTER — Inpatient Hospital Stay (HOSPITAL_COMMUNITY)
Admission: RE | Admit: 2019-04-13 | Discharge: 2019-04-14 | DRG: 470 | Disposition: A | Discharge status: Home Health | Payer: No Typology Code available for payment source | Attending: Orthopaedic Surgery | Admitting: Orthopaedic Surgery

## 2019-04-13 DIAGNOSIS — F329 Major depressive disorder, single episode, unspecified: Secondary | ICD-10-CM | POA: Diagnosis present

## 2019-04-13 DIAGNOSIS — I10 Essential (primary) hypertension: Secondary | ICD-10-CM | POA: Diagnosis present

## 2019-04-13 DIAGNOSIS — E039 Hypothyroidism, unspecified: Secondary | ICD-10-CM | POA: Diagnosis present

## 2019-04-13 DIAGNOSIS — M25751 Osteophyte, right hip: Secondary | ICD-10-CM | POA: Diagnosis present

## 2019-04-13 DIAGNOSIS — M25559 Pain in unspecified hip: Secondary | ICD-10-CM

## 2019-04-13 DIAGNOSIS — Z87891 Personal history of nicotine dependence: Secondary | ICD-10-CM | POA: Diagnosis not present

## 2019-04-13 DIAGNOSIS — M1611 Unilateral primary osteoarthritis, right hip: Secondary | ICD-10-CM | POA: Diagnosis present

## 2019-04-13 DIAGNOSIS — Z85850 Personal history of malignant neoplasm of thyroid: Secondary | ICD-10-CM

## 2019-04-13 DIAGNOSIS — Z96641 Presence of right artificial hip joint: Secondary | ICD-10-CM

## 2019-04-13 DIAGNOSIS — M25551 Pain in right hip: Secondary | ICD-10-CM | POA: Diagnosis present

## 2019-04-13 DIAGNOSIS — Z79890 Hormone replacement therapy (postmenopausal): Secondary | ICD-10-CM

## 2019-04-13 HISTORY — PX: JOINT REPLACEMENT: SHX530

## 2019-04-13 HISTORY — PX: TOTAL HIP ARTHROPLASTY: SHX124

## 2019-04-13 SURGERY — ARTHROPLASTY, HIP, TOTAL, ANTERIOR APPROACH
Anesthesia: Spinal | Site: Hip | Laterality: Right

## 2019-04-13 MED ORDER — TRANEXAMIC ACID-NACL 1000-0.7 MG/100ML-% IV SOLN
INTRAVENOUS | Status: AC
  Filled 2019-04-13: qty 100

## 2019-04-13 MED ORDER — HYDROMORPHONE HCL 1 MG/ML IJ SOLN: 0.5000 mg | INTRAMUSCULAR | Status: DC | PRN

## 2019-04-13 MED ORDER — METHOCARBAMOL 500 MG IVPB - SIMPLE MED
500.0000 mg | Freq: Four times a day (QID) | INTRAVENOUS | Status: DC | PRN
  Filled 2019-04-13: qty 50

## 2019-04-13 MED ORDER — CEFAZOLIN SODIUM-DEXTROSE 1-4 GM/50ML-% IV SOLN
1.0000 g | Freq: Four times a day (QID) | INTRAVENOUS | Status: AC
  Administered 2019-04-13 (×2): 1 g via INTRAVENOUS
  Filled 2019-04-13 (×2): qty 50

## 2019-04-13 MED ORDER — OXYCODONE HCL 5 MG PO TABS
10.0000 mg | ORAL_TABLET | ORAL | Status: DC | PRN
  Filled 2019-04-13: qty 2

## 2019-04-13 MED ORDER — MENTHOL 3 MG MT LOZG: 1.0000 | LOZENGE | OROMUCOSAL | Status: DC | PRN

## 2019-04-13 MED ORDER — PHENYLEPHRINE 40 MCG/ML (10ML) SYRINGE FOR IV PUSH (FOR BLOOD PRESSURE SUPPORT)
PREFILLED_SYRINGE | INTRAVENOUS | Status: DC | PRN
  Administered 2019-04-13 (×5): 80 ug via INTRAVENOUS

## 2019-04-13 MED ORDER — PANTOPRAZOLE SODIUM 40 MG PO TBEC: 40.0000 mg | DELAYED_RELEASE_TABLET | Freq: Every day | ORAL | Status: DC

## 2019-04-13 MED ORDER — DOCUSATE SODIUM 100 MG PO CAPS
100.0000 mg | ORAL_CAPSULE | Freq: Two times a day (BID) | ORAL | Status: DC
  Administered 2019-04-13 – 2019-04-14 (×2): 100 mg via ORAL
  Filled 2019-04-13 (×2): qty 1

## 2019-04-13 MED ORDER — PHENYLEPHRINE 40 MCG/ML (10ML) SYRINGE FOR IV PUSH (FOR BLOOD PRESSURE SUPPORT)
PREFILLED_SYRINGE | INTRAVENOUS | Status: AC
  Filled 2019-04-13: qty 10

## 2019-04-13 MED ORDER — PROPOFOL 10 MG/ML IV BOLUS
INTRAVENOUS | Status: DC | PRN
  Administered 2019-04-13: 20 mg via INTRAVENOUS

## 2019-04-13 MED ORDER — METHOCARBAMOL 500 MG PO TABS
500.0000 mg | ORAL_TABLET | Freq: Four times a day (QID) | ORAL | Status: DC | PRN
  Administered 2019-04-13 (×2): 500 mg via ORAL
  Filled 2019-04-13 (×2): qty 1

## 2019-04-13 MED ORDER — EPHEDRINE SULFATE-NACL 50-0.9 MG/10ML-% IV SOSY
PREFILLED_SYRINGE | INTRAVENOUS | Status: DC | PRN
  Administered 2019-04-13: 10 mg via INTRAVENOUS
  Administered 2019-04-13: 5 mg via INTRAVENOUS

## 2019-04-13 MED ORDER — POLYETHYLENE GLYCOL 3350 17 G PO PACK: 17.0000 g | PACK | Freq: Every day | ORAL | Status: DC | PRN

## 2019-04-13 MED ORDER — ADULT MULTIVITAMIN W/MINERALS CH
1.0000 | ORAL_TABLET | Freq: Three times a day (TID) | ORAL | Status: DC
  Administered 2019-04-13 – 2019-04-14 (×2): 1 via ORAL
  Filled 2019-04-13 (×3): qty 1

## 2019-04-13 MED ORDER — TRANEXAMIC ACID-NACL 1000-0.7 MG/100ML-% IV SOLN
1000.0000 mg | INTRAVENOUS | Status: AC
  Administered 2019-04-13: 1000 mg via INTRAVENOUS

## 2019-04-13 MED ORDER — ONDANSETRON HCL 4 MG PO TABS: 4.0000 mg | ORAL_TABLET | Freq: Four times a day (QID) | ORAL | Status: DC | PRN

## 2019-04-13 MED ORDER — METOCLOPRAMIDE HCL 5 MG PO TABS: 5.0000 mg | ORAL_TABLET | Freq: Three times a day (TID) | ORAL | Status: DC | PRN

## 2019-04-13 MED ORDER — CEFAZOLIN SODIUM-DEXTROSE 2-4 GM/100ML-% IV SOLN
2.0000 g | INTRAVENOUS | Status: AC
  Administered 2019-04-13: 2 g via INTRAVENOUS

## 2019-04-13 MED ORDER — MIDAZOLAM HCL 2 MG/2ML IJ SOLN
INTRAMUSCULAR | Status: AC
  Filled 2019-04-13: qty 2

## 2019-04-13 MED ORDER — EPHEDRINE 5 MG/ML INJ
INTRAVENOUS | Status: AC
  Filled 2019-04-13: qty 10

## 2019-04-13 MED ORDER — 0.9 % SODIUM CHLORIDE (POUR BTL) OPTIME
TOPICAL | Status: DC | PRN
  Administered 2019-04-13: 11:00:00 1000 mL

## 2019-04-13 MED ORDER — OXYCODONE HCL 5 MG PO TABS
5.0000 mg | ORAL_TABLET | ORAL | Status: DC | PRN
  Administered 2019-04-13 – 2019-04-14 (×4): 10 mg via ORAL
  Filled 2019-04-13 (×3): qty 2

## 2019-04-13 MED ORDER — ONDANSETRON HCL 4 MG/2ML IJ SOLN: 4.0000 mg | Freq: Four times a day (QID) | INTRAMUSCULAR | Status: DC | PRN

## 2019-04-13 MED ORDER — LEVOTHYROXINE SODIUM 75 MCG PO TABS: 75.0000 ug | ORAL_TABLET | ORAL | Status: DC

## 2019-04-13 MED ORDER — BUPIVACAINE IN DEXTROSE 0.75-8.25 % IT SOLN
INTRATHECAL | Status: DC | PRN
  Administered 2019-04-13: 1.8 mL via INTRATHECAL

## 2019-04-13 MED ORDER — SODIUM CHLORIDE 0.9 % IR SOLN
Status: DC | PRN
  Administered 2019-04-13: 1000 mL

## 2019-04-13 MED ORDER — DEXAMETHASONE SODIUM PHOSPHATE 10 MG/ML IJ SOLN
INTRAMUSCULAR | Status: AC
  Filled 2019-04-13: qty 1

## 2019-04-13 MED ORDER — PHENOL 1.4 % MT LIQD
1.0000 | OROMUCOSAL | Status: DC | PRN
  Filled 2019-04-13: qty 177

## 2019-04-13 MED ORDER — ONDANSETRON HCL 4 MG/2ML IJ SOLN
INTRAMUSCULAR | Status: DC | PRN
  Administered 2019-04-13: 4 mg via INTRAVENOUS

## 2019-04-13 MED ORDER — LACTATED RINGERS IV SOLN
INTRAVENOUS | Status: DC
  Administered 2019-04-13 (×2): via INTRAVENOUS

## 2019-04-13 MED ORDER — POVIDONE-IODINE 10 % EX SWAB: 2.0000 "application " | Freq: Once | CUTANEOUS | Status: DC

## 2019-04-13 MED ORDER — LEVOTHYROXINE SODIUM 100 MCG PO TABS
100.0000 ug | ORAL_TABLET | ORAL | Status: DC
  Administered 2019-04-14: 100 ug via ORAL
  Filled 2019-04-13: qty 1

## 2019-04-13 MED ORDER — ONDANSETRON HCL 4 MG/2ML IJ SOLN
INTRAMUSCULAR | Status: AC
  Filled 2019-04-13: qty 2

## 2019-04-13 MED ORDER — PROPOFOL 10 MG/ML IV BOLUS
INTRAVENOUS | Status: AC
  Filled 2019-04-13: qty 60

## 2019-04-13 MED ORDER — TAMSULOSIN HCL 0.4 MG PO CAPS
0.4000 mg | ORAL_CAPSULE | Freq: Every day | ORAL | Status: DC
  Administered 2019-04-13: 0.4 mg via ORAL
  Filled 2019-04-13 (×2): qty 1

## 2019-04-13 MED ORDER — FENTANYL CITRATE (PF) 100 MCG/2ML IJ SOLN
INTRAMUSCULAR | Status: AC
  Filled 2019-04-13: qty 2

## 2019-04-13 MED ORDER — DEXAMETHASONE SODIUM PHOSPHATE 10 MG/ML IJ SOLN
INTRAMUSCULAR | Status: DC | PRN
  Administered 2019-04-13: 10 mg via INTRAVENOUS

## 2019-04-13 MED ORDER — ALUM & MAG HYDROXIDE-SIMETH 200-200-20 MG/5ML PO SUSP: 30.0000 mL | ORAL | Status: DC | PRN

## 2019-04-13 MED ORDER — ASPIRIN 81 MG PO CHEW
81.0000 mg | CHEWABLE_TABLET | Freq: Two times a day (BID) | ORAL | Status: DC
  Administered 2019-04-13 – 2019-04-14 (×2): 81 mg via ORAL
  Filled 2019-04-13 (×2): qty 1

## 2019-04-13 MED ORDER — MIDAZOLAM HCL 5 MG/5ML IJ SOLN
INTRAMUSCULAR | Status: DC | PRN
  Administered 2019-04-13: 2 mg via INTRAVENOUS

## 2019-04-13 MED ORDER — CEFAZOLIN SODIUM-DEXTROSE 2-4 GM/100ML-% IV SOLN
INTRAVENOUS | Status: AC
  Filled 2019-04-13: qty 100

## 2019-04-13 MED ORDER — SERTRALINE HCL 100 MG PO TABS
100.0000 mg | ORAL_TABLET | Freq: Every day | ORAL | Status: DC
  Administered 2019-04-13: 22:00:00 100 mg via ORAL
  Filled 2019-04-13: qty 1

## 2019-04-13 MED ORDER — FENTANYL CITRATE (PF) 100 MCG/2ML IJ SOLN
INTRAMUSCULAR | Status: DC | PRN
  Administered 2019-04-13: 100 ug via INTRAVENOUS

## 2019-04-13 MED ORDER — PANTOPRAZOLE SODIUM 40 MG PO TBEC
40.0000 mg | DELAYED_RELEASE_TABLET | Freq: Every day | ORAL | Status: DC
  Administered 2019-04-14: 40 mg via ORAL
  Filled 2019-04-13 (×2): qty 1

## 2019-04-13 MED ORDER — DIPHENHYDRAMINE HCL 12.5 MG/5ML PO ELIX: 12.5000 mg | ORAL_SOLUTION | ORAL | Status: DC | PRN

## 2019-04-13 MED ORDER — CHLORHEXIDINE GLUCONATE 4 % EX LIQD: 60.0000 mL | Freq: Once | CUTANEOUS | Status: DC

## 2019-04-13 MED ORDER — METOCLOPRAMIDE HCL 5 MG/ML IJ SOLN: 5.0000 mg | Freq: Three times a day (TID) | INTRAMUSCULAR | Status: DC | PRN

## 2019-04-13 MED ORDER — PROPOFOL 500 MG/50ML IV EMUL
INTRAVENOUS | Status: DC | PRN
  Administered 2019-04-13: 75 ug/kg/min via INTRAVENOUS

## 2019-04-13 MED ORDER — ACETAMINOPHEN 325 MG PO TABS: 325.0000 mg | ORAL_TABLET | Freq: Four times a day (QID) | ORAL | Status: DC | PRN

## 2019-04-13 MED ORDER — STERILE WATER FOR IRRIGATION IR SOLN
Status: DC | PRN
  Administered 2019-04-13: 2000 mL

## 2019-04-13 MED ORDER — SODIUM CHLORIDE 0.9 % IV SOLN
INTRAVENOUS | Status: DC
  Administered 2019-04-13: 75 mL/h via INTRAVENOUS
  Administered 2019-04-14: 03:00:00 via INTRAVENOUS

## 2019-04-13 SURGICAL SUPPLY — 41 items
BAG ZIPLOCK 12X15 (MISCELLANEOUS) IMPLANT
BALL HIP ARTICU EZE 36 8.5 (Hips) ×1 IMPLANT
BENZOIN TINCTURE PRP APPL 2/3 (GAUZE/BANDAGES/DRESSINGS) ×2 IMPLANT
BLADE SAW SGTL 18X1.27X75 (BLADE) ×2 IMPLANT
BLADE SURG SZ10 CARB STEEL (BLADE) ×4 IMPLANT
COVER PERINEAL POST (MISCELLANEOUS) ×2 IMPLANT
COVER SURGICAL LIGHT HANDLE (MISCELLANEOUS) ×2 IMPLANT
COVER WAND RF STERILE (DRAPES) IMPLANT
CUP ACET PNNCL SECTR W/GRIP 56 (Hips) ×1 IMPLANT
DRAPE STERI IOBAN 125X83 (DRAPES) ×2 IMPLANT
DRAPE U-SHAPE 47X51 STRL (DRAPES) ×4 IMPLANT
DRSG AQUACEL AG ADV 3.5X10 (GAUZE/BANDAGES/DRESSINGS) ×2 IMPLANT
DURAPREP 26ML APPLICATOR (WOUND CARE) ×2 IMPLANT
ELECT BLADE TIP CTD 4 INCH (ELECTRODE) ×2 IMPLANT
ELECT REM PT RETURN 15FT ADLT (MISCELLANEOUS) ×2 IMPLANT
GAUZE XEROFORM 1X8 LF (GAUZE/BANDAGES/DRESSINGS) IMPLANT
GLOVE BIO SURGEON STRL SZ7.5 (GLOVE) ×2 IMPLANT
GLOVE BIOGEL PI IND STRL 8 (GLOVE) ×2 IMPLANT
GLOVE BIOGEL PI INDICATOR 8 (GLOVE) ×2
GLOVE ECLIPSE 8.0 STRL XLNG CF (GLOVE) ×2 IMPLANT
GOWN STRL REUS W/TWL XL LVL3 (GOWN DISPOSABLE) ×4 IMPLANT
HANDPIECE INTERPULSE COAX TIP (DISPOSABLE) ×1
HEAD CERAMIC 36 PLUS5 (Hips) IMPLANT
HIP BALL ARTICU EZE 36 8.5 (Hips) ×2 IMPLANT
HOLDER FOLEY CATH W/STRAP (MISCELLANEOUS) ×2 IMPLANT
KIT TURNOVER KIT A (KITS) IMPLANT
LINER NEUTRAL 52MMX36MMX56N (Liner) ×2 IMPLANT
PACK ANTERIOR HIP CUSTOM (KITS) ×2 IMPLANT
PINN SECTOR W/GRIP ACE CUP 56 (Hips) ×2 IMPLANT
SCREW 6.5MMX25MM (Screw) ×2 IMPLANT
SET HNDPC FAN SPRY TIP SCT (DISPOSABLE) ×1 IMPLANT
STEM FEMORAL SZ 5MM STD ACTIS (Stem) ×2 IMPLANT
STRIP CLOSURE SKIN 1/2X4 (GAUZE/BANDAGES/DRESSINGS) ×4 IMPLANT
SUT ETHIBOND NAB CT1 #1 30IN (SUTURE) ×2 IMPLANT
SUT MNCRL AB 4-0 PS2 18 (SUTURE) IMPLANT
SUT VIC AB 0 CT1 36 (SUTURE) ×2 IMPLANT
SUT VIC AB 1 CT1 36 (SUTURE) ×2 IMPLANT
SUT VIC AB 2-0 CT1 27 (SUTURE) ×2
SUT VIC AB 2-0 CT1 TAPERPNT 27 (SUTURE) ×2 IMPLANT
TRAY FOLEY MTR SLVR 16FR STAT (SET/KITS/TRAYS/PACK) ×2 IMPLANT
YANKAUER SUCT BULB TIP 10FT TU (MISCELLANEOUS) ×2 IMPLANT

## 2019-04-13 NOTE — Anesthesia Postprocedure Evaluation (Signed)
Anesthesia Post Note  Patient: Rickey Cochran  Procedure(s) Performed: RIGHT TOTAL HIP ARTHROPLASTY ANTERIOR APPROACH (Right Hip)     Patient location during evaluation: Nursing Unit Anesthesia Type: Spinal Level of consciousness: oriented and awake and alert Pain management: pain level controlled Vital Signs Assessment: post-procedure vital signs reviewed and stable Respiratory status: spontaneous breathing and respiratory function stable Cardiovascular status: blood pressure returned to baseline and stable Postop Assessment: no headache, no backache, no apparent nausea or vomiting and patient able to bend at knees Anesthetic complications: no    Last Vitals:  Vitals:   04/13/19 1215 04/13/19 1230  BP: 110/70 116/67  Pulse: 71 74  Resp: 12 (!) 23  Temp:    SpO2: 100% 100%    Last Pain:  Vitals:   04/13/19 1145  TempSrc:   PainSc: 0-No pain                 Barnet [place]

## 2019-04-13 NOTE — Anesthesia Procedure Notes (Signed)
Spinal  Patient location during procedure: OR Start time: 04/13/2019 10:01 AM End time: 04/13/2019 10:06 AM Staffing Resident/CRNA: Lollie Sails, CRNA Performed: resident/CRNA  Preanesthetic Checklist Completed: patient identified, site marked, surgical consent, pre-op evaluation, timeout performed, IV checked, risks and benefits discussed and monitors and equipment checked Spinal Block Patient position: sitting Prep: site prepped and draped and DuraPrep Patient monitoring: heart rate, continuous pulse ox, blood pressure and cardiac monitor Approach: midline Location: L2-3 Injection technique: single-shot Needle Needle type: Sprotte  Needle gauge: 24 G Needle length: 10 cm Additional Notes Expiration date on kit noted and within range.  Good CSF flow noted with no heme or c/o paresthesia.   Patient tolerated well.

## 2019-04-13 NOTE — Brief Op Note (Signed)
04/13/2019  11:21 AM  PATIENT:  Rickey Cochran  57 y.o. male  PRE-OPERATIVE DIAGNOSIS:  osteoarthritis right hip  POST-OPERATIVE DIAGNOSIS:  osteoarthritis right hip  PROCEDURE:  Procedure(s): RIGHT TOTAL HIP ARTHROPLASTY ANTERIOR APPROACH (Right)  SURGEON:  Surgeon(s) and Role:    Mcarthur Rossetti, MD - Primary  PHYSICIAN ASSISTANT: Benita Stabile, PA-C  ANESTHESIA:   spinal  EBL:  200 mL   COUNTS:  YES  TOURNIQUET:  * No tourniquets in log *  DICTATION: .Other Dictation: Dictation Number [id number]  PLAN OF CARE: Admit to inpatient   PATIENT DISPOSITION:  PACU - hemodynamically stable.   Delay start of Pharmacological VTE agent (>24hrs) due to surgical blood loss or risk of bleeding: no

## 2019-04-13 NOTE — Evaluation (Signed)
Physical Therapy Evaluation Patient Details Name: Rickey Cochran MRN: [id number] DOB: Nov 16, 1961 Today's Date: 04/13/2019   History of Present Illness  58 yo male s/p R DA-THA on 04/13/19. PMH includes OA, pes planus, depression, hypothyroidism s/p partial thyroidectomy.  Clinical Impression   Pt presents with R hip pain, decreased R hip strength post-surgery, difficulty performing bed mobility, and increased time and effort to perform mobility tasks. Pt to benefit from acute PT to address deficits. Pt ambulated 100 ft with RW with min guard assist, verbal cuing for form and safety provided throughout. Pt educated on ankle pumps (20/hour) to perform this afternoon/evening to increase circulation, to pt's tolerance and limited by pain. PT to progress mobility as tolerated, and will continue to follow acutely.        Follow Up Recommendations Follow surgeon's recommendation for DC plan and follow-up therapies;Supervision for mobility/OOB(HHPT)    Equipment Recommendations  Rolling walker with 5" wheels;3in1 (PT)    Recommendations for Other Services       Precautions / Restrictions Precautions Precautions: Fall Restrictions Weight Bearing Restrictions: No Other Position/Activity Restrictions: WBAT      Mobility  Bed Mobility Overal bed mobility: Needs Assistance Bed Mobility: Supine to Sit;Sit to Supine     Supine to sit: Min assist;HOB elevated Sit to supine: Min assist;HOB elevated   General bed mobility comments: min assist for supine<>sit for RLE management. Increased time and effort, use of trapeze bar to position self in bed post-ambulation.   Transfers Overall transfer level: Needs assistance Equipment used: Rolling walker (2 wheeled) Transfers: Sit to/from Stand Sit to Stand: Min guard;From elevated surface         General transfer comment: Min guard for safety. Verbal cuing for hand placement when rising.   Ambulation/Gait Ambulation/Gait assistance: Min  guard Gait Distance (Feet): 100 Feet Assistive device: Rolling walker (2 wheeled) Gait Pattern/deviations: Step-to pattern;Decreased stride length;Antalgic;Trunk flexed Gait velocity: decr    General Gait Details: Min guard for safety, verbal cuing for upright posture, sequencing.   Stairs            Wheelchair Mobility    Modified Rankin (Stroke Patients Only)       Balance Overall balance assessment: Mild deficits observed, not formally tested                                           Pertinent Vitals/Pain Pain Assessment: 0-10 Pain Score: 3  Pain Location: R hip  Pain Descriptors / Indicators: Sore;Burning Pain Intervention(s): Limited activity within patient's tolerance;Repositioned;Monitored during session    Home Living Family/patient expects to be discharged to:: Private residence Living Arrangements: Spouse/significant other Available Help at Discharge: Family;Available 24 hours/day(Wife working from home all of next week to be home to assist 24/7) Type of Home: House Home Access: Stairs to enter Entrance Stairs-Rails: None Entrance Stairs-Number of Steps: 2 Home Layout: Two level;Laundry or work area in basement;Able to live on main level with [employer]: None      Prior Function Level of Independence: Independent         Comments: Pt reports being in significant pain at times, 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      Hand Dominance   Dominant Hand: Right    Extremity/Trunk Assessment   Upper Extremity Assessment Upper Extremity Assessment: Overall WFL for tasks assessed    Lower Extremity Assessment Lower  Extremity Assessment: RLE deficits/detail RLE Deficits / Details: suspected post-surgical weakness; able to perform ankle pumps, quad set, heel slides, lift assist for SLR RLE Sensation: WNL    Cervical / Trunk Assessment Cervical / Trunk Assessment: Normal  Communication    Communication: No difficulties  Cognition Arousal/Alertness: Awake/alert Behavior During Therapy: WFL for tasks assessed/performed Overall Cognitive Status: Within Functional Limits for tasks assessed                                        General Comments      Exercises     Assessment/Plan    PT Assessment Patient needs continued PT services  PT Problem List Decreased strength;Decreased mobility;Decreased safety awareness;Decreased activity tolerance;Decreased balance;Decreased knowledge of use of DME;Pain;Decreased range of motion       PT Treatment Interventions DME instruction;Functional mobility training;Balance training;Patient/family education;Gait training;Therapeutic activities;Stair training;Therapeutic exercise    PT Goals (Current goals can be found in the Care Plan section)  Acute Rehab PT Goals Patient Stated Goal: return to PLOF PT Goal Formulation: With patient Time For Goal Achievement: 04/20/19 Potential to Achieve Goals: Good    Frequency 7X/week   Barriers to discharge        Co-evaluation               AM-PAC PT "6 Clicks" Mobility  Outcome Measure Help needed turning from your back to your side while in a flat bed without using bedrails?: A Little Help needed moving from lying on your back to sitting on the side of a flat bed without using bedrails?: A Little Help needed moving to and from a bed to a chair (including a wheelchair)?: A Little Help needed standing up from a chair using your arms (e.g., wheelchair or bedside chair)?: A Little Help needed to walk in hospital room?: A Little Help needed climbing 3-5 steps with a railing? : A Little 6 Click Score: 18    End of Session Equipment Utilized During Treatment: Gait belt;Oxygen(O2 reapplied after session) Activity Tolerance: Patient tolerated treatment well Patient left: in chair;with chair alarm set;with call bell/phone within reach(on SCD break for skin  integrity) Nurse Communication: Mobility status PT Visit Diagnosis: Other abnormalities of gait and mobility (R26.89);Difficulty in walking, not elsewhere classified (R26.2)    Time: 8563-1497 PT Time Calculation (min) (ACUTE ONLY): 22 min   Charges:   PT Evaluation $PT Eval Low Complexity: 1 Low        [facility] Conception Chancy, PT Acute Rehabilitation Services Pager [phone]  Office [phone]   Regan Llorente D Elonda Husky 04/13/2019, 7:03 PM

## 2019-04-13 NOTE — H&P (Signed)
TOTAL HIP ADMISSION H&P  Patient is admitted for right total hip arthroplasty.  Subjective:  Chief Complaint: right hip pain  HPI: Rickey Cochran, 58 y.o. male, has a history of pain and functional disability in the right hip(s) due to arthritis and patient has failed non-surgical conservative treatments for greater than 12 weeks to include NSAID's and/or analgesics, corticosteriod injections and activity modification.  Onset of symptoms was abrupt starting 1 years ago with rapidlly worsening course since that time.The patient noted no past surgery on the right hip(s).  Patient currently rates pain in the right hip at 10 out of 10 with activity. Patient has night pain, worsening of pain with activity and weight bearing, trendelenberg gait, pain that interfers with activities of daily living and pain with passive range of motion. Patient has evidence of subchondral cysts, subchondral sclerosis, periarticular osteophytes and joint space narrowing by imaging studies. This condition presents safety issues increasing the risk of falls.  There is no current active infection.  Patient Active Problem List   Diagnosis Date Noted  . Unilateral primary osteoarthritis, right hip 02/21/2019  . OA (osteoarthritis) of hip 02/09/2019  . S/P partial thyroidectomy 05/09/2018  . Right hip pain 06/30/2016  . Left hamstring muscle strain 09/11/2015  . Pes planus of both feet 09/11/2015   Past Medical History:  Diagnosis Date  . Arthritis   . Cancer [facility])    possible thyroid cancer, now s/p partial thyroidectomy   . Depression   . Hypertension    several years ago was on lisiopril but lost weight and no longer needs  . Hypothyroidism    mgd on levothyroxine   . Left-sided tinnitus   . Premature atrial contraction    was seen on EKG at his PCP approx 8 years ago, denies any symptoms, reports he was a runner before onset of hip pain in feb 2020    Past Surgical History:  Procedure Laterality Date  .  COLONOSCOPY    . THYROID LOBECTOMY Right 05/09/2018  . THYROID LOBECTOMY Right 05/09/2018   Procedure: RIGHT THYROID LOBECTOMY;  Surgeon: Ralene Ok, MD;  Location: [place];  Service: General;  Laterality: Right;    Current Facility-Administered Medications  Medication Dose Route Frequency Provider Last Rate Last Dose  . ceFAZolin (ANCEF) IVPB 2g/100 mL premix  2 g Intravenous On Call to OR Pete Pelt, PA-C      . chlorhexidine (HIBICLENS) 4 % liquid 4 application  60 mL Topical Once Erskine Emery W, PA-C      . lactated ringers infusion   Intravenous Continuous Barnet Glasgow, MD      . tranexamic acid (CYKLOKAPRON) IVPB 1,000 mg  1,000 mg Intravenous To OR Pete Pelt, PA-C       No Known Allergies  Social History   Tobacco Use  . Smoking status: Former Research scientist (life sciences)  . Smokeless tobacco: Never Used  . Tobacco comment: QUIT IN 2004  Substance Use Topics  . Alcohol use: Not Currently    Alcohol/week: 0.0 standard drinks    Comment: quit    No family history on file.   Review of Systems  Musculoskeletal: Positive for joint pain.  All other systems reviewed and are negative.   Objective:  Physical Exam  Constitutional: He is oriented to person, place, and time. He appears well-developed and well-nourished.  HENT:  Head: Normocephalic and atraumatic.  Eyes: Pupils are equal, round, and reactive to light. EOM are normal.  Neck: Normal range of motion.  Neck supple.  Cardiovascular: Normal rate and regular rhythm.  Respiratory: Effort normal and breath sounds normal.  GI: Soft.  Musculoskeletal:     Right hip: He exhibits decreased range of motion, decreased strength, tenderness and bony tenderness.  Neurological: He is alert and oriented to person, place, and time.  Skin: Skin is warm and dry.  Psychiatric: He has a normal mood and affect.    Vital signs in last 24 hours: Weight:  [98.5 kg] 98.5 kg (05/22 0700)  Labs:   Estimated body mass index is 27.89  kg/m as calculated from the following:   Height as of this encounter: 6\' 2"  (1.88 m).   Weight as of this encounter: 98.5 kg.   Imaging Review Plain radiographs and mainly MRI demonstrates severe degenerative joint disease of the right hip(s). The bone quality appears to be excellent for age and reported activity level.      Assessment/Plan:  End stage arthritis, right hip(s)  The patient history, physical examination, clinical judgement of the provider and imaging studies are consistent with end stage degenerative joint disease of the right hip(s) and total hip arthroplasty is deemed medically necessary. The treatment options including medical management, injection therapy, arthroscopy and arthroplasty were discussed at length. The risks and benefits of total hip arthroplasty were presented and reviewed. The risks due to aseptic loosening, infection, stiffness, dislocation/subluxation,  thromboembolic complications and other imponderables were discussed.  The patient acknowledged the explanation, agreed to proceed with the plan and consent was signed. Patient is being admitted for inpatient treatment for surgery, pain control, PT, OT, prophylactic antibiotics, VTE prophylaxis, progressive ambulation and ADL's and discharge planning.The patient is planning to be discharged home with home health services

## 2019-04-13 NOTE — Transfer of Care (Signed)
Immediate Anesthesia Transfer of Care Note  Patient: Rickey Cochran  Procedure(s) Performed: RIGHT TOTAL HIP ARTHROPLASTY ANTERIOR APPROACH (Right Hip)  Patient Location: PACU  Anesthesia Type:Spinal  Level of Consciousness: awake, drowsy and patient cooperative  Airway & Oxygen Therapy: Patient Spontanous Breathing and Patient connected to face mask oxygen  Post-op Assessment: Report given to RN and Post -op Vital signs reviewed and stable  Post vital signs: Reviewed and stable  Last Vitals:  Vitals Value Taken Time  BP    Temp    Pulse    Resp    SpO2      Last Pain:  Vitals:   04/13/19 0721  TempSrc:   PainSc: 1       Patients Stated Pain Goal: 1 (83/43/73 5789)  Complications: No apparent anesthesia complications

## 2019-04-13 NOTE — Op Note (Signed)
NAME: Rickey Cochran, Rickey Cochran MEDICAL RECORD HR:[id number] ACCOUNT [id number] DATE OF BIRTH:1961/11/02 FACILITY: [facility] LOCATION: [facility] PHYSICIAN:CHRISTOPHER Kerry Fort, MD  OPERATIVE REPORT  DATE OF PROCEDURE:  04/13/2019  PREOPERATIVE DIAGNOSIS:  Primary osteoarthritis and degenerative joint disease, right hip.  POSTOPERATIVE DIAGNOSIS:  Primary osteoarthritis and degenerative joint disease, right hip.  PROCEDURE:  Right total hip arthroplasty through direct anterior approach.  IMPLANTS:  DePuy Sector Gription acetabular component, size 56 with a single screw, size 36+0 neutral polyethylene liner, size 5 ACTIS femoral component with standard offset, size 36+8.5 metal-on-metal hip ball.  SURGEON:  Lind Guest. Ninfa Linden, MD  ASSISTANT:  Erskine Emery, PA-C.  ANESTHESIA:  Spinal.  ANTIBIOTICS:  Two grams IV Ancef.  ESTIMATED BLOOD LOSS:  200-250 mL.  COMPLICATIONS:  None.  INDICATIONS:  The patient is a very pleasant 58 year old gentleman with debilitating right hip pain for some time now.  Although his plain films did not show severe arthritis in his hip, an MRI was obtained that showed a large joint effusion and  significant full thickness cartilage loss of the superolateral aspect and anterior acetabulum as well.  Given his debilitating pain and the MRI findings, we recommended total hip arthroplasty.  He had tried and failed conservative treatment including  intra-articular injections of steroid.  The risks and benefits of surgery explained to him in detail.  We talked about the risk of acute blood loss anemia, nerve or vessel injury, fracture, infection, DVT, dislocation, implant failure.  We talked about  the goals of decreased pain, improve mobility and overall improve quality of life.  DESCRIPTION OF PROCEDURE:  After informed consent was obtained and appropriate right hip was marked, he was brought to the operating room and sat up on a stretcher where spinal anesthesia  was then obtained.  He was then laid in the supine position on a  stretcher.  I assessed his leg lengths and found them to be equal.  I placed traction boots on both his feet and a Foley catheter was placed as well.  He was then placed supine on the Hana fracture table with a perineal post in place and both legs in  line skeletal traction device and no traction applied.  His right operative hip was prepped and draped with DuraPrep and sterile drapes.  A time-out was called.  He was identified as correct patient, correct right hip.  We then made an incision just  inferior and posterior to the anterior superior iliac spine and carried this obliquely down the leg.  We dissected down tensor fascia lata muscle.  Tensor fascia was then divided longitudinally to proceed with direct anterior approach to the hip.  We  identified and cauterized circumflex vessels.  I then identified the hip capsule, opened the hip capsule in an L-type format finding a significant joint effusion.  We placed a curved Cobra retractor around the medial and lateral femoral neck and made our  femoral neck cut with an oscillating saw just proximal to the lesser trochanter and completed this with an osteotome.  I placed a corkscrew guide in the femoral head and removed the femoral head in its entirety and found a wide area devoid of cartilage.   I then placed a bent Hohmann over the medial acetabular rim and removed remnants of the acetabular labrum and other debris.  I then began reaming in several increments going from a size 44 reamer, going all the way to a size 55 with all reamers under  direct visualization, the last reamer  under direct fluoroscopy, so we could obtain our depth of reaming, our inclination and anteversion.  I then placed the real DePuy Sector Gription acetabular component size 56 without difficulty and I did place a  single screw.  I then placed a 36+0 neutral polyethylene liner for that size acetabular component.  Given  some slight overhang, I do not think he needed a +4.  Attention was then turned to the femur.  With the leg externally rotated to 120 degrees,  extended and adducted, we were able to use a Mueller retractor medially and Hohman retractor behind the greater trochanter, released lateral joint capsule and used a box-cutting osteotome to enter the femoral canal and a rongeur to lateralize.  I then  began broaching using Actis as broaching system going from a size zero up to a size 5.  With the 5 in place, we trialed a standard offset femoral neck and a 36+1.5 hip ball, reduced this in the acetabulum and I appreciated the stability, but I felt like  he needed just a little bit more offset and leg length.  We dislocated the hip and removed the trial components.  I then placed the real Actis femoral component with standard offset size 5 with a 36+5 hip ball, reduced this in the acetabulum and I just  did not feel that I liked his stability and felt like he just needed a little bit more tightening, so I dislocated the hip and actually removed the real +5 ceramic hip ball and I went with a +8.5 metal hip ball and reduced this in the acetabulum and  definitely felt like that increased his leg length and offset as well and equal to the opposite side and it felt stable on exam putting him through mechanical motion and stressing the hip.  We then irrigated the soft tissue with normal saline solution  using pulsatile lavage.  I closed the joint capsule with interrupted #1 Ethibond suture.  We closed tensor fascia with a running #1 Vicryl followed by closing the deep tissue was 0 Vicryl, 2-0 Vicryl was used to close subcutaneous tissue, and 4-0  Monocryl was used to close the skin.  Steri-Strips and an Aquacel dressing applied.    He was taken off the Hana table and taken to recovery room in stable condition.    All final counts were correct.    There were no complications noted.    Of note, Benita Stabile, PA-C, assisted  the entire case.  His assistance was crucial for facilitating all aspects of this case.  AN/NUANCE  D:04/13/2019 T:04/13/2019 JOB:[id number]

## 2019-04-13 NOTE — Anesthesia Procedure Notes (Signed)
Procedure Name: MAC Date/Time: 04/13/2019 10:00 AM Performed by: Lollie Sails, CRNA Pre-anesthesia Checklist: Patient identified, Emergency Drugs available, Suction available and Patient being monitored Oxygen Delivery Method: Simple face mask

## 2019-04-14 LAB — CBC
HCT: 40.5 % (ref 39.0–52.0)
Hemoglobin: 13.5 g/dL (ref 13.0–17.0)
MCH: 31.6 pg (ref 26.0–34.0)
MCHC: 33.3 g/dL (ref 30.0–36.0)
MCV: 94.8 fL (ref 80.0–100.0)
Platelets: 212 10*3/uL (ref 150–400)
RBC: 4.27 MIL/uL (ref 4.22–5.81)
RDW: 11.9 % (ref 11.5–15.5)
WBC: 13.2 10*3/uL — ABNORMAL HIGH (ref 4.0–10.5)
nRBC: 0 % (ref 0.0–0.2)

## 2019-04-14 LAB — BASIC METABOLIC PANEL
Anion gap: 7 (ref 5–15)
BUN: 11 mg/dL (ref 6–20)
CO2: 27 mmol/L (ref 22–32)
Calcium: 8.6 mg/dL — ABNORMAL LOW (ref 8.9–10.3)
Chloride: 104 mmol/L (ref 98–111)
Creatinine, Ser: 0.73 mg/dL (ref 0.61–1.24)
GFR calc Af Amer: >60 mL/min (ref >60)
GFR calc non Af Amer: >60 mL/min (ref >60)
Glucose, Bld: 134 mg/dL — ABNORMAL HIGH (ref 70–99)
Potassium: 3.9 mmol/L (ref 3.5–5.1)
Sodium: 138 mmol/L (ref 135–145)

## 2019-04-14 MED ORDER — ASPIRIN 81 MG PO CHEW: 81.0000 mg | CHEWABLE_TABLET | Freq: Two times a day (BID) | ORAL | 0 refills | Status: DC

## 2019-04-14 MED ORDER — METHOCARBAMOL 500 MG PO TABS: 500.0000 mg | ORAL_TABLET | Freq: Four times a day (QID) | ORAL | 0 refills | Status: DC | PRN

## 2019-04-14 MED ORDER — OXYCODONE HCL 5 MG PO TABS: 5.0000 mg | ORAL_TABLET | Freq: Four times a day (QID) | ORAL | 0 refills | Status: DC | PRN

## 2019-04-14 MED FILL — METHOCARBAMOL 500 MG TABLET: 500 | 10 days supply | Qty: 40 | Fill #0

## 2019-04-14 MED FILL — oxyCODONE HCL 5 MG TABS: 5 | 5 days supply | Qty: 40 | Fill #0

## 2019-04-14 MED FILL — ASPIRIN LOW DOSE 81 MG CHEW: 81 | 15 days supply | Qty: 30 | Fill #0

## 2019-04-14 NOTE — Progress Notes (Signed)
Physical Therapy Treatment Patient Details Name: Rickey Cochran MRN: [id number] DOB: 17-Jul-1961 Today's Date: 04/14/2019    History of Present Illness 58 yo male s/p R DA-THA on 04/13/19. PMH includes OA, pes planus, depression, hypothyroidism s/p partial thyroidectomy.    PT Comments    Progressing well with mobility. Reviewed/practiced exercises, gait training, and stair training. Issued HEP at pt's request. All education completed. Okay to d/c from PT standpoint.    Follow Up Recommendations  Follow surgeon's recommendation for DC plan and follow-up therapies     Equipment Recommendations  Rolling walker with 5" wheels    Recommendations for Other Services       Precautions / Restrictions Precautions Precautions: Fall Restrictions Weight Bearing Restrictions: No Other Position/Activity Restrictions: WBAT    Mobility  Bed Mobility Overal bed mobility: Needs Assistance Bed Mobility: Supine to Sit;Sit to Supine     Supine to sit: Supervision;HOB elevated Sit to supine: Supervision;HOB elevated   General bed mobility comments: for safety.   Transfers Overall transfer level: Needs assistance Equipment used: Rolling walker (2 wheeled) Transfers: Sit to/from Stand Sit to Stand: Supervision         General transfer comment: for safety. VCs hand placement.   Ambulation/Gait Ambulation/Gait assistance: Supervision Gait Distance (Feet): 170 Feet Assistive device: Rolling walker (2 wheeled) Gait Pattern/deviations: Step-through pattern;Decreased stride length     General Gait Details: for safety. slow gait speed. Pt reported R posterior hip/glute pain with ambulation.    Stairs Stairs: Yes Stairs assistance: Min guard Stair Management: Forwards;Step to pattern Number of Stairs: 2 General stair comments: up and over portable steps. Pt will have his wife and a post to hold on to. VCs safety, sequence. Close guard for safety.    Wheelchair Mobility     Modified Rankin (Stroke Patients Only)       Balance Overall balance assessment: Mild deficits observed, not formally tested                                          Cognition Arousal/Alertness: Awake/alert Behavior During Therapy: WFL for tasks assessed/performed Overall Cognitive Status: Within Functional Limits for tasks assessed                                        Exercises Total Joint Exercises Ankle Circles/Pumps: AROM;Both;10 reps;Supine Quad Sets: AROM;Both;10 reps;Supine Heel Slides: AROM;Right;10 reps;Supine Hip ABduction/ADduction: AROM;Right;10 reps;Supine Long Arc Quad: AROM;Right;10 reps Knee Flexion: AROM;Right;10 reps;Standing Marching in Standing: AROM;Right;Left;10 reps;Standing General Exercises - Lower Extremity Heel Raises: AROM;Both;10 reps;Standing    General Comments        Pertinent Vitals/Pain Pain Assessment: 0-10 Pain Score: 5  Pain Location: R hip  Pain Descriptors / Indicators: Sore;Aching Pain Intervention(s): Monitored during session;Ice applied    Home Living                      Prior Function            PT Goals (current goals can now be found in the care plan section) Progress towards PT goals: Progressing toward goals    Frequency    7X/week      PT Plan Current plan remains appropriate    Co-evaluation  AM-PAC PT "6 Clicks" Mobility   Outcome Measure  Help needed turning from your back to your side while in a flat bed without using bedrails?: A Little Help needed moving from lying on your back to sitting on the side of a flat bed without using bedrails?: A Little Help needed moving to and from a bed to a chair (including a wheelchair)?: A Little Help needed standing up from a chair using your arms (e.g., wheelchair or bedside chair)?: A Little Help needed to walk in hospital room?: A Little Help needed climbing 3-5 steps with a railing? : A  Little 6 Click Score: 18    End of Session   Activity Tolerance: Patient tolerated treatment well Patient left: in bed;with call bell/phone within reach   PT Visit Diagnosis: Other abnormalities of gait and mobility (R26.89);Difficulty in walking, not elsewhere classified (R26.2)     Time: 1448-1856 PT Time Calculation (min) (ACUTE ONLY): 27 min  Charges:  $Gait Training: 8-22 mins $Therapeutic Exercise: 8-22 mins                        Weston Anna, PT Acute Rehabilitation Services Pager: [phone] Office: [phone]

## 2019-04-14 NOTE — Discharge Summary (Signed)
Patient ID: Rickey Cochran MRN: [id number] DOB/AGE: July 27, 1961 58 y.o.  Admit date: 04/13/2019 Discharge date: 04/14/2019  Admission Diagnoses:  Principal Problem:   Unilateral primary osteoarthritis, right hip Active Problems:   Status post total replacement of right hip   Discharge Diagnoses:  Same  Past Medical History:  Diagnosis Date  . Arthritis   . Cancer [facility])    possible thyroid cancer, now s/p partial thyroidectomy   . Depression   . Hypertension    several years ago was on lisiopril but lost weight and no longer needs  . Hypothyroidism    mgd on levothyroxine   . Left-sided tinnitus   . Premature atrial contraction    was seen on EKG at his PCP approx 8 years ago, denies any symptoms, reports he was a runner before onset of hip pain in feb 2020    Surgeries: Procedure(s): RIGHT TOTAL HIP ARTHROPLASTY ANTERIOR APPROACH on 04/13/2019   Consultants:   Discharged Condition: Improved  Hospital Course: Rickey Cochran is an 58 y.o. male who was admitted 04/13/2019 for operative treatment ofUnilateral primary osteoarthritis, right hip. Patient has severe unremitting pain that affects sleep, daily activities, and work/hobbies. After pre-op clearance the patient was taken to the operating room on 04/13/2019 and underwent  Procedure(s): RIGHT TOTAL HIP ARTHROPLASTY ANTERIOR APPROACH.    Patient was given perioperative antibiotics:  Anti-infectives (From admission, onward)   Start     Dose/Rate Route Frequency Ordered Stop   04/13/19 1600  ceFAZolin (ANCEF) IVPB 1 g/50 mL premix     1 g 100 mL/hr over 30 Minutes Intravenous Every 6 hours 04/13/19 1349 04/13/19 2236   04/13/19 0715  ceFAZolin (ANCEF) IVPB 2g/100 mL premix     2 g 200 mL/hr over 30 Minutes Intravenous On call to O.R. 04/13/19 0700 04/13/19 1007   04/13/19 0707  ceFAZolin (ANCEF) 2-4 GM/100ML-% IVPB    Note to Pharmacy:  Kyra Leyland   : cabinet override      04/13/19 0707 04/13/19 1007        Patient was given sequential compression devices, early ambulation, and chemoprophylaxis to prevent DVT.  Patient benefited maximally from hospital stay and there were no complications.    Recent vital signs:  Patient Vitals for the past 24 hrs:  BP Temp Temp src Pulse Resp SpO2 Height Weight  04/14/19 1030 134/74 98.7 F (37.1 C) Oral 71 16 97 % - -  04/14/19 0526 118/73 98.7 F (37.1 C) Oral 74 20 100 % - -  04/14/19 0148 112/69 98.6 F (37 C) Oral 76 20 99 % - -  04/13/19 2120 122/71 98.8 F (37.1 C) Oral 73 16 99 % - -  04/13/19 1638 128/84 (!) 97.5 F (36.4 C) Oral (!) 42 16 99 % - -  04/13/19 1544 119/70 97.8 F (36.6 C) Oral (!) 42 16 99 % - -  04/13/19 1437 132/64 98 F (36.7 C) Oral (!) 41 16 100 % - -  04/13/19 1330 118/72 97.9 F (36.6 C) - - 12 - 6\' 2"  (1.88 m) 98.5 kg  04/13/19 1315 118/73 - - 71 12 100 % - -  04/13/19 1300 95/74 - - 67 14 100 % - -  04/13/19 1245 120/79 - - 65 19 100 % - -  04/13/19 1230 116/67 - - 74 (!) 23 100 % - -  04/13/19 1215 110/70 - - 71 12 100 % - -     Recent laboratory studies:  Recent Labs  04/14/19 0347  WBC 13.2*  HGB 13.5  HCT 40.5  PLT 212  NA 138  K 3.9  CL 104  CO2 27  BUN 11  CREATININE 0.73  GLUCOSE 134*  CALCIUM 8.6*     Discharge Medications:   Allergies as of 04/14/2019   No Known Allergies     Medication List    TAKE these medications   aspirin 81 MG chewable tablet Chew 1 tablet (81 mg total) by mouth 2 (two) times daily.   ibuprofen 200 MG tablet Commonly known as:  ADVIL Take 400-600 mg by mouth every 8 (eight) hours as needed (for pain.).   levothyroxine 100 MCG tablet Commonly known as:  SYNTHROID Take 100 mcg by mouth See admin instructions. Taking every other day.   levothyroxine 75 MCG tablet Commonly known as:  SYNTHROID Take 75 mcg by mouth See admin instructions. taking every other day   methocarbamol 500 MG tablet Commonly known as:  ROBAXIN Take 1 tablet (500 mg total)  by mouth every 6 (six) hours as needed for muscle spasms.   multivitamin with minerals Tabs tablet Take 1 tablet by mouth 3 (three) times daily.   oxyCODONE 5 MG immediate release tablet Commonly known as:  Oxy IR/ROXICODONE Take 1-2 tablets (5-10 mg total) by mouth every 6 (six) hours as needed for moderate pain (pain score 4-6).   sertraline 100 MG tablet Commonly known as:  ZOLOFT Take 100 mg by mouth at bedtime.   Turmeric 500 MG Caps Take 1,000 mg by mouth daily.            Durable Medical Equipment  (From admission, onward)         Start     Ordered   04/13/19 1349  DME 3 n 1  Once     04/13/19 1349   04/13/19 1349  DME Walker rolling  Once    Question:  Patient needs a walker to treat with the following condition  Answer:  Status post total replacement of right hip   04/13/19 1349          Diagnostic Studies: Dg Pelvis Portable  Result Date: 04/13/2019 CLINICAL DATA:  Status post right hip arthroplasty EXAM: PORTABLE PELVIS 1-2 VIEWS COMPARISON:  None. FINDINGS: Interval right total hip arthroplasty without failure complication. Postsurgical changes in the soft tissues surrounding the right hip. No acute fracture or dislocation. IMPRESSION: Interval right total hip arthroplasty. Electronically Signed   By: Kathreen Devoid   On: 04/13/2019 12:46   Dg C-arm 1-60 Min-no Report  Result Date: 04/13/2019 Fluoroscopy was utilized by the requesting physician.  No radiographic interpretation.   Dg Hip Operative Unilat W Or W/o Pelvis Right  Result Date: 04/13/2019 CLINICAL DATA:  Intraoperative fluoroscopic localization for right total hip replacement. EXAM: OPERATIVE RIGHT HIP (WITH PELVIS IF PERFORMED) 2 VIEWS TECHNIQUE: Fluoroscopic spot image(s) were submitted for interpretation post-operatively. COMPARISON:  Prior radiograph from 01/23/2019. FINDINGS: Two spot intraoperative AP views of the right hip demonstrate interval placement of a right total hip arthroplasty. The  acetabular and femoral components appear well seated and aligned. No periprosthetic fracture or other complication. Osteoarthritic changes noted about the partially visualized left hip. IMPRESSION: Intraoperative fluoroscopic localization for right total hip arthroplasty. Electronically Signed   By: Jeannine Boga M.D.   On: 04/13/2019 14:00    Disposition: Discharge disposition: 01-Home or [place], [facility]  Follow up.   Why:  new name for [place] information: [place] [address] [phone]        Mcarthur Rossetti, MD. Schedule an appointment as soon as possible for a visit in 2 week(s).   Specialty:  Orthopedic Surgery Contact information: [place] [address] [phone]            Signed: Mcarthur Rossetti 04/14/2019, 12:01 PM

## 2019-04-14 NOTE — Progress Notes (Signed)
Subjective: 1 Day Post-Op Procedure(s) (LRB): RIGHT TOTAL HIP ARTHROPLASTY ANTERIOR APPROACH (Right) Patient reports pain as moderate.    Objective: Vital signs in last 24 hours: Temp:  [97.5 F (36.4 C)-98.8 F (37.1 C)] 98.7 F (37.1 C) (05/23 1030) Pulse Rate:  [41-76] 71 (05/23 1030) Resp:  [12-23] 16 (05/23 1030) BP: (95-134)/(59-84) 134/74 (05/23 1030) SpO2:  [97 %-100 %] 97 % (05/23 1030) Weight:  [98.5 kg] 98.5 kg (05/22 1330)  Intake/Output from previous day: 05/22 0701 - 05/23 0700 In: 3859.7 [P.O.:950; I.V.:2659.7; IV Piggyback:250] Out: 5208 [Urine:3725; Blood:200] Intake/Output this shift: Total I/O In: 360 [P.O.:360] Out: 0   Recent Labs    04/14/19 0347  HGB 13.5   Recent Labs    04/14/19 0347  WBC 13.2*  RBC 4.27  HCT 40.5  PLT 212   Recent Labs    04/14/19 0347  NA 138  K 3.9  CL 104  CO2 27  BUN 11  CREATININE 0.73  GLUCOSE 134*  CALCIUM 8.6*   No results for input(s): LABPT, INR in the last 72 hours.  Sensation intact distally Intact pulses distally Dorsiflexion/Plantar flexion intact Incision: dressing C/D/I   Assessment/Plan: 1 Day Post-Op Procedure(s) (LRB): RIGHT TOTAL HIP ARTHROPLASTY ANTERIOR APPROACH (Right) Up with therapy Discharge home with home health this afternoon.    Patient's anticipated LOS is less than 2 midnights, meeting these requirements: - Younger than 26 - Lives within 1 hour of care - Has a competent adult at home to recover with post-op recover - NO history of  - Chronic pain requiring opiods  - Diabetes  - Coronary Artery Disease  - Heart failure  - Heart attack  - Stroke  - DVT/VTE  - Cardiac arrhythmia  - Respiratory Failure/COPD  - Renal failure  - Anemia  - Advanced Liver disease       Mcarthur Rossetti 04/14/2019, 11:35 AM

## 2019-04-14 NOTE — Discharge Instructions (Signed)

## 2019-04-14 NOTE — Care Management (Signed)
Pt for home health with [facility] RW and 3in1 orders called to [facility] rep for delivery to room. Marney Doctor RN,BSN [phone]

## 2019-04-17 ENCOUNTER — Encounter: Payer: Self-pay | Admitting: *Deleted

## 2019-04-17 ENCOUNTER — Other Ambulatory Visit: Payer: Self-pay | Admitting: *Deleted

## 2019-04-17 NOTE — Patient Outreach (Signed)
[facility]) Care Management  04/17/2019  WINDELL MUSSON [birth year] [id number]  Transition of care call   Referral received: 04/02/19 Initial outreach: 04/12/19 for preoperative call Insurance: [employer]   Subjective: Initial successful telephone call to patient's preferred number in order to complete transition of care assessment; 2 HIPAA identifiers verified. Explained purpose of call and completed transition of care assessment.  [name] states he is doing well, denies post-operative problems, states surgical pain well managed with over the counter medications, tolerating diet, denies bowel or bladder problems. He says his surgical dressing is dry and intact.  Spouse is assisting with his recovery.  He says he received a call from the home health agency today and a physical therapist is to call after 5 pm today to arrange the first home health physical therapy session. He says he has been doing his home exercise program and walking with rolling walker without difficulty.    Objective:   [name] was hospitalized at [facility] from 5/22-5/23/2020 for right total hip arthroplasty. Comorbidities include: partial thyroidectomy He was discharged to home on 04/14/19 with a rolling walker and 3 in 1 from [facility] and home health physical therapy to be provided by [facility] agency.    Assessment:  Patient voices good understanding of all discharge instructions.  See transition of care flowsheet for assessment details.   Plan:  Reviewed hospital discharge diagnosis of right total hip arthroplasty and treatment plan using hospital discharge instructions, assessing medication adherence and discussing the importance of follow up with primary care provider and specialists. No ongoing care management needs identified so will close case to [facility] care management services and route successful outreach letter with [facility] pamphlet and 24 Hour Nurse Line Magnet to [facility] clinical pool to be mailed to patient's home.  Barrington Ellison RN,CCM,CDE [place] [facility] Coordinator Office Phone [phone] Office Fax [phone]

## 2019-04-18 ENCOUNTER — Encounter (HOSPITAL_COMMUNITY): Payer: Self-pay | Admitting: Orthopaedic Surgery

## 2019-04-19 ENCOUNTER — Telehealth: Payer: Self-pay | Admitting: Radiology

## 2019-04-19 NOTE — Telephone Encounter (Signed)
Verbal order given  

## 2019-04-19 NOTE — Telephone Encounter (Signed)
Merry Proud is calling to get verbal orders for 1 week 2 for HHPT. Please called him to advise

## 2019-04-26 ENCOUNTER — Other Ambulatory Visit: Payer: Self-pay

## 2019-04-26 ENCOUNTER — Ambulatory Visit (INDEPENDENT_AMBULATORY_CARE_PROVIDER_SITE_OTHER): Payer: No Typology Code available for payment source | Admitting: Orthopaedic Surgery

## 2019-04-26 ENCOUNTER — Encounter: Payer: Self-pay | Admitting: Orthopaedic Surgery

## 2019-04-26 DIAGNOSIS — Z96641 Presence of right artificial hip joint: Secondary | ICD-10-CM

## 2019-04-26 NOTE — Progress Notes (Signed)
The patient is now 2 weeks tomorrow status post a right total hip arthroplasty.  He is walking without an assistive device doing well overall.  On exam his leg lengths are equal.  His right hip incision looks good.  I remove the old Steri-Strips in place new ones.  He tells me putting his hip through range of motion.  All question concerns were answered and addressed.  We talked about his mobility and things he should avoid and not avoid.  I would like to see him back in 4 weeks from now to make sure he is doing well overall.  No x-rays are needed.

## 2019-04-28 MED FILL — LEVOTHYROXINE 75 MCG TABLET: 75 | 30 days supply | Qty: 15 | Fill #5

## 2019-04-28 MED FILL — LEVOTHYROXINE 100 MCG TAB: 100 | 30 days supply | Qty: 15 | Fill #5

## 2019-05-15 MED FILL — LEVOTHYROXINE 88 MCG TABLET: 88 | 90 days supply | Qty: 90 | Fill #0

## 2019-05-23 ENCOUNTER — Ambulatory Visit (INDEPENDENT_AMBULATORY_CARE_PROVIDER_SITE_OTHER): Payer: No Typology Code available for payment source | Admitting: Internal Medicine

## 2019-05-23 ENCOUNTER — Other Ambulatory Visit: Payer: Self-pay

## 2019-05-23 ENCOUNTER — Encounter: Payer: Self-pay | Admitting: Internal Medicine

## 2019-05-23 ENCOUNTER — Other Ambulatory Visit (INDEPENDENT_AMBULATORY_CARE_PROVIDER_SITE_OTHER): Payer: No Typology Code available for payment source

## 2019-05-23 VITALS — BP 126/82 | HR 60 | Temp 98.2°F | Resp 16 | Ht 74.0 in | Wt 216.8 lb

## 2019-05-23 DIAGNOSIS — R739 Hyperglycemia, unspecified: Secondary | ICD-10-CM

## 2019-05-23 DIAGNOSIS — R351 Nocturia: Secondary | ICD-10-CM

## 2019-05-23 DIAGNOSIS — N401 Enlarged prostate with lower urinary tract symptoms: Secondary | ICD-10-CM

## 2019-05-23 DIAGNOSIS — F418 Other specified anxiety disorders: Secondary | ICD-10-CM

## 2019-05-23 DIAGNOSIS — Z23 Encounter for immunization: Secondary | ICD-10-CM

## 2019-05-23 DIAGNOSIS — E890 Postprocedural hypothyroidism: Secondary | ICD-10-CM | POA: Insufficient documentation

## 2019-05-23 DIAGNOSIS — H9313 Tinnitus, bilateral: Secondary | ICD-10-CM

## 2019-05-23 LAB — BASIC METABOLIC PANEL
BUN: 8 mg/dL (ref 6–23)
CO2: 28 mEq/L (ref 19–32)
Calcium: 9.1 mg/dL (ref 8.4–10.5)
Chloride: 106 mEq/L (ref 96–112)
Creatinine, Ser: 0.89 mg/dL (ref 0.40–1.50)
GFR: 87.66 mL/min (ref >60.00)
Glucose, Bld: 93 mg/dL (ref 70–99)
Potassium: 4.4 mEq/L (ref 3.5–5.1)
Sodium: 142 mEq/L (ref 135–145)

## 2019-05-23 LAB — HEMOGLOBIN A1C: Hgb A1c MFr Bld: 5.3 % (ref 4.6–6.5)

## 2019-05-23 LAB — PSA: PSA: 1.39 ng/mL (ref 0.10–4.00)

## 2019-05-23 MED ORDER — ALFUZOSIN HCL ER 10 MG PO TB24: 10.0000 mg | ORAL_TABLET | Freq: Every day | ORAL | 1 refills | Status: DC

## 2019-05-23 MED ORDER — SHINGRIX 50 MCG/0.5ML IM SUSR: 0.5000 mL | Freq: Once | INTRAMUSCULAR | 1 refills | Status: AC

## 2019-05-23 MED FILL — SHINGRIX 50 MCG SUS: 50 | 1 days supply | Qty: 1 | Fill #0

## 2019-05-23 MED FILL — ALFUZOSIN HCL ER 10 MG TAB: 10 | 90 days supply | Qty: 90 | Fill #0

## 2019-05-23 NOTE — Patient Instructions (Signed)

## 2019-05-23 NOTE — Progress Notes (Signed)
Subjective:  Patient ID: Rickey Cochran, male    DOB: [birth year]  Age: 58 y.o. MRN: [id number]  CC: Hypothyroidism  NEW TO ME  HPI COSIMO SCHERTZER presents for establishing as a new patient.  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  He tells me he saw his endocrinologist just a few weeks ago and his TSH was in the normal range.  He also complains of chronic tinnitus but says it has gotten worse over the last few months.  It is a little more prominent on the left than the right.  He does not complain of decreased level of hearing.  He complains of nocturia up to 3-4 times a night.  He recently underwent a pelvic MRI and was told that he had an enlarged prostate gland.  He is a Geophysical data processor and denies any recent episodes of CP, DOE, palpitations, edema, or fatigue.  He recently had hip surgery and was found to have mild hyperglycemia perioperatively.  History Tarius has a past medical history of Arthritis, Cancer ([facility]), Depression, Hypertension, Hypothyroidism, Left-sided tinnitus, and Premature atrial contraction.   He has a past surgical history that includes Thyroid lobectomy (Right, 05/09/2018); Thyroid lobectomy (Right, 05/09/2018); Colonoscopy; Joint replacement (Right, 04/13/2019); and Total hip arthroplasty (Right, 04/13/2019).   His family history is not on file.He reports that he has quit smoking. He has never used smokeless tobacco. He reports previous alcohol use. He reports that he does not use drugs.  Outpatient Medications Prior to Visit  Medication Sig Dispense Refill  . aspirin 81 MG chewable tablet Chew 1 tablet (81 mg total) by mouth 2 (two) times daily. 30 tablet 0  . ibuprofen (ADVIL,MOTRIN) 200 MG tablet Take 400-600 mg by mouth every 8 (eight) hours as needed (for pain.).    Marland Kitchen Multiple Vitamin (MULTIVITAMIN WITH MINERALS) TABS tablet Take 1 tablet by mouth 3 (three) times daily.    .  sertraline (ZOLOFT) 100 MG tablet Take 100 mg by mouth at bedtime.     . Turmeric 500 MG CAPS Take 1,000 mg by mouth daily.    Marland Kitchen levothyroxine (SYNTHROID, LEVOTHROID) 100 MCG tablet Take 100 mcg by mouth See admin instructions. Taking every other day.    . levothyroxine (SYNTHROID, LEVOTHROID) 75 MCG tablet Take 75 mcg by mouth See admin instructions. taking every other day    . levothyroxine (SYNTHROID) 88 MCG tablet     . methocarbamol (ROBAXIN) 500 MG tablet Take 1 tablet (500 mg total) by mouth every 6 (six) hours as needed for muscle spasms. (Patient not taking: Reported on 04/17/2019) 40 tablet 0  . oxyCODONE (OXY IR/ROXICODONE) 5 MG immediate release tablet Take 1-2 tablets (5-10 mg total) by mouth every 6 (six) hours as needed for moderate pain (pain score 4-6). (Patient not taking: Reported on 04/17/2019) 40 tablet 0   No facility-administered medications prior to visit.     ROS Review of Systems  Constitutional: Positive for unexpected weight change (wt gain). Negative for appetite change, diaphoresis and fatigue.  HENT: Positive for tinnitus. Negative for ear discharge, ear pain and trouble swallowing.   Eyes: Negative.  Negative for visual disturbance.  Respiratory: Negative.  Negative for cough, chest tightness and wheezing.   Cardiovascular: Negative for chest pain and leg swelling.  Gastrointestinal: Negative for abdominal pain, constipation, diarrhea, nausea and vomiting.  Endocrine: Negative.   Genitourinary: Positive for difficulty urinating. Negative for decreased urine volume, dysuria, hematuria  and urgency.  Musculoskeletal: Positive for arthralgias. Negative for back pain, myalgias and neck pain.  Skin: Negative.  Negative for color change and pallor.  Neurological: Negative.  Negative for dizziness, weakness and light-headedness.  Hematological: Negative for adenopathy. Does not bruise/bleed easily.  Psychiatric/Behavioral: Negative.     Objective:  BP 126/82 (BP  Location: Left Arm, Patient Position: Sitting, Cuff Size: Normal)   Pulse 60   Temp 98.2 F (36.8 C) (Oral)   Resp 16   Ht 6\' 2"  (1.88 m)   Wt 216 lb 12 oz (98.3 kg)   SpO2 98%   BMI 27.83 kg/m   Physical Exam Constitutional:      Appearance: He is not ill-appearing or diaphoretic.  HENT:     Right Ear: Hearing, tympanic membrane, ear canal and external ear normal.     Left Ear: Hearing, tympanic membrane, ear canal and external ear normal.     Nose: Nose normal.     Mouth/Throat:     Mouth: Mucous membranes are moist.     Pharynx: No posterior oropharyngeal erythema.  Eyes:     General: No scleral icterus.    Conjunctiva/sclera: Conjunctivae normal.  Neck:     Musculoskeletal: Normal range of motion and neck supple. No neck rigidity.     Thyroid: No thyroid mass, thyromegaly or thyroid tenderness.  Cardiovascular:     Rate and Rhythm: Normal rate and regular rhythm.     Heart sounds: No murmur. No gallop.   Pulmonary:     Effort: Pulmonary effort is normal.     Breath sounds: No stridor. No wheezing, rhonchi or rales.  Abdominal:     General: Abdomen is flat. Bowel sounds are normal. There is no distension.     Palpations: There is no hepatomegaly, splenomegaly or mass.     Tenderness: There is no abdominal tenderness.  Musculoskeletal: Normal range of motion.        General: No swelling.     Right lower leg: No edema.     Left lower leg: No edema.  Lymphadenopathy:     Cervical: No cervical adenopathy.  Skin:    General: Skin is warm and dry.  Neurological:     General: No focal deficit present.     Mental Status: He is alert. Mental status is at baseline.  Psychiatric:        Mood and Affect: Mood normal.        Behavior: Behavior normal.      Lab Results  Component Value Date   WBC 13.2 (H) 04/14/2019   HGB 13.5 04/14/2019   HCT 40.5 04/14/2019   PLT 212 04/14/2019   GLUCOSE 93 05/23/2019   CHOL 206 (A) 09/22/2018   TRIG 159 09/22/2018   HDL 48  09/22/2018   LDLCALC 126 09/22/2018   NA 142 05/23/2019   K 4.4 05/23/2019   CL 106 05/23/2019   CREATININE 0.89 05/23/2019   BUN 8 05/23/2019   CO2 28 05/23/2019   PSA 1.39 05/23/2019   HGBA1C 5.3 05/23/2019      Assessment & Plan:   Athel was seen today for hypothyroidism.  Diagnoses and all orders for this visit:  BPH associated with nocturia- His PSA is low which is reassuring that he does not have prostate cancer.  I have asked him to start taking a peripheral alpha-blocker for symptom relief. -     PSA; Future -     alfuzosin (UROXATRAL) 10 MG 24 hr tablet; Take  1 tablet (10 mg total) by mouth daily with breakfast.  Hyperglycemia- His blood sugars are normal now. -     Basic metabolic panel; Future -     Hemoglobin A1c; Future  Depression with anxiety- He is doing well on the current dose of sertraline.  Need for shingles vaccine -     Zoster Vaccine Adjuvanted [facility]) injection; Inject 0.5 mLs into the muscle once for 1 dose.  Need for Tdap vaccination -     Tdap vaccine greater than or equal to 7yo IM  Postoperative hypothyroidism- He appears euthyroid and tells me that his recent TSH was normal.  I do not have access to that TSH level.  Tinnitus aurium, bilateral- I have asked him to undergo audiological evaluation. -     Ambulatory referral to Audiology   I have discontinued Enos C. Macbride "Steve"'s oxyCODONE and methocarbamol. I am also having him start on Shingrix and alfuzosin. Additionally, I am having him maintain his sertraline, multivitamin with minerals, ibuprofen, Turmeric, aspirin, and levothyroxine.  Meds ordered this encounter  Medications  . Zoster Vaccine Adjuvanted [facility]) injection    Sig: Inject 0.5 mLs into the muscle once for 1 dose.    Dispense:  0.5 mL    Refill:  1  . alfuzosin (UROXATRAL) 10 MG 24 hr tablet    Sig: Take 1 tablet (10 mg total) by mouth daily with breakfast.    Dispense:  90 tablet    Refill:  1      Follow-up: Return in about 6 months (around 11/23/2019).  Scarlette Calico, MD

## 2019-05-24 ENCOUNTER — Ambulatory Visit (INDEPENDENT_AMBULATORY_CARE_PROVIDER_SITE_OTHER): Payer: No Typology Code available for payment source | Admitting: Orthopaedic Surgery

## 2019-05-24 ENCOUNTER — Encounter: Payer: Self-pay | Admitting: Orthopaedic Surgery

## 2019-05-24 ENCOUNTER — Other Ambulatory Visit: Payer: Self-pay

## 2019-05-24 ENCOUNTER — Encounter: Payer: Self-pay | Admitting: Internal Medicine

## 2019-05-24 DIAGNOSIS — Z96641 Presence of right artificial hip joint: Secondary | ICD-10-CM

## 2019-05-24 NOTE — Progress Notes (Signed)
Rickey Cochran is now 6-week status post a right total hip arthroplasty.  Rickey Cochran actually [place].  Rickey Cochran is doing well overall with no issues.  Rickey Cochran says his hip feels way better than what Rickey Cochran did preoperatively.  On exam Rickey Cochran tolerates including his right hip to internal and external rotation.  His leg lengths are equal.  Rickey Cochran has just some slight numbness of his incision but overall is doing great.  We will continue increase his activities as comfort allows.  I do not need to see him back for 6 months.  At that visit I would like a standing low AP pelvis and lateral of his right operative hip.  All question concerns were otherwise answered and addressed.

## 2019-05-25 ENCOUNTER — Encounter: Payer: Self-pay | Admitting: Internal Medicine

## 2019-05-25 DIAGNOSIS — H9313 Tinnitus, bilateral: Secondary | ICD-10-CM | POA: Insufficient documentation

## 2019-05-28 MED FILL — LEVOTHYROXINE 100 MCG TAB: 100 | 30 days supply | Qty: 15 | Fill #6

## 2019-05-30 ENCOUNTER — Other Ambulatory Visit: Payer: Self-pay | Admitting: Internal Medicine

## 2019-05-30 DIAGNOSIS — Z9009 Acquired absence of other part of head and neck: Secondary | ICD-10-CM

## 2019-05-30 DIAGNOSIS — E890 Postprocedural hypothyroidism: Secondary | ICD-10-CM

## 2019-05-30 DIAGNOSIS — C73 Malignant neoplasm of thyroid gland: Secondary | ICD-10-CM

## 2019-06-06 MED FILL — CIPROFLOXACIN HCL 500 MG TA: 500 | 7 days supply | Qty: 14 | Fill #0

## 2019-06-27 ENCOUNTER — Ambulatory Visit
Admission: RE | Admit: 2019-06-27 | Discharge: 2019-06-27 | Disposition: A | Discharge status: Home / Self Care | Payer: No Typology Code available for payment source | Source: Ambulatory Visit | Attending: Internal Medicine | Admitting: Internal Medicine

## 2019-06-27 DIAGNOSIS — Z9009 Acquired absence of other part of head and neck: Secondary | ICD-10-CM

## 2019-06-27 DIAGNOSIS — E890 Postprocedural hypothyroidism: Secondary | ICD-10-CM

## 2019-06-27 DIAGNOSIS — C73 Malignant neoplasm of thyroid gland: Secondary | ICD-10-CM

## 2019-06-27 MED FILL — SERTRALINE HCL 100 MG TAB: 100 | 90 days supply | Qty: 90 | Fill #3

## 2019-07-25 MED FILL — SHINGRIX 50 MCG SUS: 50 | 1 days supply | Qty: 1 | Fill #1

## 2019-09-03 MED FILL — ALFUZOSIN HCL ER 10 MG TB24: 10 | 90 days supply | Qty: 90 | Fill #1

## 2019-09-03 MED FILL — LEVOTHYROXINE 88 MCG TABLET: 88 | 90 days supply | Qty: 90 | Fill #1

## 2019-09-06 ENCOUNTER — Ambulatory Visit (INDEPENDENT_AMBULATORY_CARE_PROVIDER_SITE_OTHER): Payer: No Typology Code available for payment source

## 2019-09-06 ENCOUNTER — Other Ambulatory Visit: Payer: Self-pay

## 2019-09-06 DIAGNOSIS — Z23 Encounter for immunization: Secondary | ICD-10-CM

## 2019-09-25 MED FILL — SERTRALINE HCL 100 MG TAB: 100 | 90 days supply | Qty: 90 | Fill #0

## 2019-11-06 LAB — TSH: TSH: 1.19 (ref <=5.90)

## 2019-11-26 ENCOUNTER — Ambulatory Visit (INDEPENDENT_AMBULATORY_CARE_PROVIDER_SITE_OTHER): Payer: No Typology Code available for payment source

## 2019-11-26 ENCOUNTER — Other Ambulatory Visit: Payer: Self-pay

## 2019-11-26 ENCOUNTER — Encounter: Payer: Self-pay | Admitting: Orthopaedic Surgery

## 2019-11-26 ENCOUNTER — Ambulatory Visit: Payer: No Typology Code available for payment source | Admitting: Orthopaedic Surgery

## 2019-11-26 DIAGNOSIS — Z96641 Presence of right artificial hip joint: Secondary | ICD-10-CM | POA: Diagnosis not present

## 2019-11-26 MED FILL — LEVOTHYROXINE 88 MCG TABLET: 88 | 84 days supply | Qty: 90 | Fill #0

## 2019-11-26 NOTE — Progress Notes (Signed)
The patient is now 7 months status post a right total hip arthroplasty through direct anterior approach.  He says he is doing well overall.  He is now hiking about 10 miles at a time.  He does report some low back pain and he does get left knee pain and stiffness when he first gets up in the morning but is able to walk it off.  He has no issues as a relates to his right total hip.  On exam the right hip and left hip moves smoothly.  His left knee does show some slight stiffness.  He does have stiffness in his lumbar spine as well.  An AP pelvis and lateral of the right hip shows a well-seated total hip arthroplasty with no complicating features.  The AP view of his pelvis does show the lower lumbar spine where there is significant degenerative disc disease.  If his spine or left knee becomes an issue he needs to come see [place].  Otherwise follow-up can be as needed for his hip.  I talked him at length about the things he would need to bring him back for the right hip replacement if he develops any type of dull aching pain or other issues he will let us know.  All question concerns were answered addressed.  Follow-up is otherwise as needed.

## 2019-12-05 ENCOUNTER — Other Ambulatory Visit: Payer: Self-pay | Admitting: Internal Medicine

## 2019-12-05 DIAGNOSIS — N401 Enlarged prostate with lower urinary tract symptoms: Secondary | ICD-10-CM

## 2019-12-05 MED FILL — ALFUZOSIN HCL ER 10 MG TB24: 10 | 90 days supply | Qty: 90 | Fill #0

## 2020-01-10 MED FILL — SERTRALINE HCL 100 MG TAB: 100 | 90 days supply | Qty: 90 | Fill #0

## 2020-01-29 ENCOUNTER — Encounter: Payer: Self-pay | Admitting: Internal Medicine

## 2020-01-29 ENCOUNTER — Ambulatory Visit (INDEPENDENT_AMBULATORY_CARE_PROVIDER_SITE_OTHER): Payer: No Typology Code available for payment source | Admitting: Internal Medicine

## 2020-01-29 ENCOUNTER — Other Ambulatory Visit: Payer: Self-pay

## 2020-01-29 VITALS — BP 130/78 | HR 49 | Temp 98.2°F | Ht 74.0 in | Wt 218.0 lb

## 2020-01-29 DIAGNOSIS — E890 Postprocedural hypothyroidism: Secondary | ICD-10-CM

## 2020-01-29 DIAGNOSIS — Z0000 Encounter for general adult medical examination without abnormal findings: Secondary | ICD-10-CM

## 2020-01-29 DIAGNOSIS — F418 Other specified anxiety disorders: Secondary | ICD-10-CM | POA: Diagnosis not present

## 2020-01-29 DIAGNOSIS — I491 Atrial premature depolarization: Secondary | ICD-10-CM | POA: Diagnosis not present

## 2020-01-29 DIAGNOSIS — R351 Nocturia: Secondary | ICD-10-CM

## 2020-01-29 DIAGNOSIS — N401 Enlarged prostate with lower urinary tract symptoms: Secondary | ICD-10-CM | POA: Diagnosis not present

## 2020-01-29 DIAGNOSIS — Z1159 Encounter for screening for other viral diseases: Secondary | ICD-10-CM | POA: Insufficient documentation

## 2020-01-29 DIAGNOSIS — H9313 Tinnitus, bilateral: Secondary | ICD-10-CM

## 2020-01-29 LAB — CBC WITH DIFFERENTIAL/PLATELET
Basophils Absolute: 0 10*3/uL (ref 0.0–0.1)
Basophils Relative: 0.4 % (ref 0.0–3.0)
Eosinophils Absolute: 0.1 10*3/uL (ref 0.0–0.7)
Eosinophils Relative: 1.4 % (ref 0.0–5.0)
HCT: 44.3 % (ref 39.0–52.0)
Hemoglobin: 15.1 g/dL (ref 13.0–17.0)
Lymphocytes Relative: 26.5 % (ref 12.0–46.0)
Lymphs Abs: 1.5 10*3/uL (ref 0.7–4.0)
MCHC: 34.1 g/dL (ref 30.0–36.0)
MCV: 93.6 fl (ref 78.0–100.0)
Monocytes Absolute: 0.4 10*3/uL (ref 0.1–1.0)
Monocytes Relative: 6.9 % (ref 3.0–12.0)
Neutro Abs: 3.7 10*3/uL (ref 1.4–7.7)
Neutrophils Relative %: 64.8 % (ref 43.0–77.0)
Platelets: 223 10*3/uL (ref 150.0–400.0)
RBC: 4.74 Mil/uL (ref 4.22–5.81)
RDW: 12.6 % (ref 11.5–15.5)
WBC: 5.8 10*3/uL (ref 4.0–10.5)

## 2020-01-29 LAB — HEPATIC FUNCTION PANEL
ALT: 23 U/L (ref 0–53)
AST: 21 U/L (ref 0–37)
Albumin: 4.1 g/dL (ref 3.5–5.2)
Alkaline Phosphatase: 50 U/L (ref 39–117)
Bilirubin, Direct: 0.2 mg/dL (ref 0.0–0.3)
Total Bilirubin: 1.4 mg/dL — ABNORMAL HIGH (ref 0.2–1.2)
Total Protein: 6.6 g/dL (ref 6.0–8.3)

## 2020-01-29 LAB — TSH: TSH: 1 u[IU]/mL (ref 0.35–4.50)

## 2020-01-29 LAB — BASIC METABOLIC PANEL
BUN: 11 mg/dL (ref 6–23)
CO2: 27 mEq/L (ref 19–32)
Calcium: 9 mg/dL (ref 8.4–10.5)
Chloride: 106 mEq/L (ref 96–112)
Creatinine, Ser: 0.77 mg/dL (ref 0.40–1.50)
GFR: 103.36 mL/min (ref >60.00)
Glucose, Bld: 90 mg/dL (ref 70–99)
Potassium: 4 mEq/L (ref 3.5–5.1)
Sodium: 139 mEq/L (ref 135–145)

## 2020-01-29 LAB — LIPID PANEL
Cholesterol: 208 mg/dL — ABNORMAL HIGH (ref 0–200)
HDL: 52.7 mg/dL (ref >39.00)
LDL Cholesterol: 136 mg/dL — ABNORMAL HIGH (ref 0–99)
NonHDL: 155.05
Total CHOL/HDL Ratio: 4
Triglycerides: 93 mg/dL (ref 0.0–149.0)
VLDL: 18.6 mg/dL (ref 0.0–40.0)

## 2020-01-29 LAB — PSA: PSA: 1.41 ng/mL (ref 0.10–4.00)

## 2020-01-29 MED ORDER — SERTRALINE HCL 100 MG PO TABS: 100.0000 mg | ORAL_TABLET | Freq: Every day | ORAL | 1 refills | Status: DC

## 2020-01-29 NOTE — Progress Notes (Signed)
Subjective:  Patient ID: Rickey Cochran, male    DOB: 1961-10-03  Age: 59 y.o. MRN: [id number]  CC: Annual Exam and Hypothyroidism   HPI Rickey Cochran presents for a CPX.  He has a longstanding history of bradycardia and PACs.  He denies palpitations.  He goes on 8 mile hikes and does steep inclines up hills and mountains and does not experience CP, DOE, edema, palpitations, diaphoresis, dizziness, lightheadedness, near syncope, or fatigue.  He has chronic ringing in his ears and wants to see an ENT doctor.  He is doing well on the current dose of sertraline and wants a refill.  Outpatient Medications Prior to Visit  Medication Sig Dispense Refill   alfuzosin (UROXATRAL) 10 MG 24 hr tablet TAKE 1 TABLET BY MOUTH ONCE A DAY WITH BREAKFAST 90 tablet 1   aspirin 81 MG chewable tablet Chew 1 tablet (81 mg total) by mouth 2 (two) times daily. 30 tablet 0   levothyroxine (SYNTHROID) 88 MCG tablet      Multiple Vitamin (MULTIVITAMIN WITH MINERALS) TABS tablet Take 1 tablet by mouth 3 (three) times daily.     ibuprofen (ADVIL,MOTRIN) 200 MG tablet Take 400-600 mg by mouth every 8 (eight) hours as needed (for pain.).     sertraline (ZOLOFT) 100 MG tablet Take 100 mg by mouth at bedtime.      Turmeric 500 MG CAPS Take 1,000 mg by mouth daily.     No facility-administered medications prior to visit.    ROS Review of Systems  Constitutional: Negative for diaphoresis and fatigue.  HENT: Positive for tinnitus. Negative for hearing loss.   Respiratory: Negative for cough, chest tightness, shortness of breath and wheezing.   Cardiovascular: Negative for chest pain, palpitations and leg swelling.  Gastrointestinal: Negative for abdominal pain, constipation, diarrhea, nausea and vomiting.  Endocrine: Negative for cold intolerance and heat intolerance.  Genitourinary: Positive for difficulty urinating. Negative for penile swelling, scrotal swelling, testicular pain and urgency.       He  has a weak urine stream but tells me the peripheral alpha-blocker helps quite a bit.  Musculoskeletal: Negative.  Negative for arthralgias and myalgias.  Skin: Negative for color change and pallor.  Neurological: Negative.  Negative for dizziness, weakness and light-headedness.  Hematological: Negative for adenopathy. Does not bruise/bleed easily.  Psychiatric/Behavioral: Negative.     Objective:  BP 130/78 (BP Location: Left Arm, Patient Position: Sitting, Cuff Size: Normal)    Pulse (!) 49    Temp 98.2 F (36.8 C) (Oral)    Ht 6\' 2"  (1.88 m)    Wt 218 lb (98.9 kg)    SpO2 98%    BMI 27.99 kg/m   BP Readings from Last 3 Encounters:  01/29/20 130/78  05/23/19 126/82  04/14/19 132/74    Wt Readings from Last 3 Encounters:  01/29/20 218 lb (98.9 kg)  05/23/19 216 lb 12 oz (98.3 kg)  04/13/19 217 lb 4 oz (98.5 kg)    Physical Exam Vitals reviewed.  Constitutional:      Appearance: Normal appearance.  HENT:     Right Ear: Hearing, tympanic membrane, ear canal and external ear normal.     Left Ear: Hearing, tympanic membrane, ear canal and external ear normal.     Nose: Nose normal.     Mouth/Throat:     Mouth: Mucous membranes are moist.  Eyes:     General: No scleral icterus.    Conjunctiva/sclera: Conjunctivae normal.  Neck:  Thyroid: No thyroid mass, thyromegaly or thyroid tenderness.  Cardiovascular:     Rate and Rhythm: Bradycardia present. Occasional extrasystoles are present.    Heart sounds: Normal heart sounds, S1 normal and S2 normal. No systolic murmur. No diastolic murmur. No gallop.      Comments: EKG -  Sinus bradycardia, 58 bpm 1 PAC w aberrant conduction No LVH No Q waves Pulmonary:     Effort: Pulmonary effort is normal.     Breath sounds: No stridor. No wheezing, rhonchi or rales.  Abdominal:     General: Abdomen is flat. Bowel sounds are normal. There is no distension.     Palpations: Abdomen is soft. There is no hepatomegaly, splenomegaly or  mass.     Tenderness: There is no abdominal tenderness. There is no guarding.     Hernia: No hernia is present. There is no hernia in the left inguinal area or right inguinal area.  Genitourinary:    Pubic Area: No rash.      Penis: Normal and circumcised. No lesions.      Testes: Normal.        Right: Mass or tenderness not present.        Left: Mass or tenderness not present.     Epididymis:     Right: Normal. Not inflamed or enlarged.     Left: Normal. Not inflamed or enlarged. No mass.     Prostate: Enlarged (2+ smooth symm BPH). Not tender and no nodules present.     Rectum: Normal. Guaiac result negative. No mass, tenderness, anal fissure, external hemorrhoid or internal hemorrhoid. Normal anal tone.  Musculoskeletal:     Cervical back: Neck supple.     Right lower leg: No edema.     Left lower leg: No edema.  Lymphadenopathy:     Lower Body: No right inguinal adenopathy. No left inguinal adenopathy.  Skin:    General: Skin is warm and dry.     Coloration: Skin is not pale.  Neurological:     General: No focal deficit present.     Mental Status: He is alert and oriented to person, place, and time. Mental status is at baseline.  Psychiatric:        Mood and Affect: Mood normal.        Behavior: Behavior normal.        Thought Content: Thought content normal.        Judgment: Judgment normal.     Lab Results  Component Value Date   WBC 5.8 01/29/2020   HGB 15.1 01/29/2020   HCT 44.3 01/29/2020   PLT 223.0 01/29/2020   GLUCOSE 90 01/29/2020   CHOL 208 (H) 01/29/2020   TRIG 93.0 01/29/2020   HDL 52.70 01/29/2020   LDLCALC 136 (H) 01/29/2020   ALT 23 01/29/2020   AST 21 01/29/2020   NA 139 01/29/2020   K 4.0 01/29/2020   CL 106 01/29/2020   CREATININE 0.77 01/29/2020   BUN 11 01/29/2020   CO2 27 01/29/2020   TSH 1.00 01/29/2020   PSA 1.41 01/29/2020   HGBA1C 5.3 05/23/2019    US THYROID  Result Date: 06/28/2019 CLINICAL DATA:  History of thyroid cancer and  right hemithyroidectomy. Prior biopsy of soft tissue at the right surgical bed on 09/12/2018. Biopsy demonstrated scant follicular epithelium. EXAM: THYROID ULTRASOUND TECHNIQUE: Ultrasound examination of the thyroid gland and adjacent soft tissues was performed. COMPARISON:  08/25/2018 FINDINGS: Parenchymal Echotexture: Mildly heterogenous Isthmus: 0.3 cm, previously 0.4 cm Right  lobe: Surgically removed Left lobe: 5.4 x 1.8 x 1.8 cm, previously 5.5 x 1.9 x 1.6 cm _________________________________________________________ Estimated total number of nodules >/= 1 cm: 0 Number of spongiform nodules >/=  2 cm not described below (TR1): 0 Number of mixed cystic and solid nodules >/= 1.5 cm not described below (Edna): 0 _________________________________________________________ Again noted is heterogeneous tissue at the right hemithyroidectomy surgical bed. This area has been previously biopsied. This soft tissue is slightly hypoechoic and measures 1.7 x 0.7 x 1.1 cm and previously measured 1.6 x 0.6 x 1.0 cm. Soft tissue at the surgical bed is essentially stable. No enlarged lymph nodes along the right side of the neck. Nodule # 1: Prior biopsy: No Location: Left; Inferior Maximum size: 0.9 cm; Other 2 dimensions: 0.6 x 0.8 cm, previously, 0.8 x 0.6 x 0.7 cm Composition: solid/almost completely solid (2) Echogenicity: hypoechoic (2) Shape: not taller-than-wide (0) Margins: ill-defined (0) Echogenic foci: none (0) ACR TI-RADS total points: 4. ACR TI-RADS risk category:  TR4 (4-6 points). Significant change in size (>/= 20% in two dimensions and minimal increase of 2 mm): No Change in features: No Change in ACR TI-RADS risk category: No ACR TI-RADS recommendations: Given size (<0.9 cm) and appearance, this nodule does NOT meet TI-RADS criteria for biopsy or dedicated follow-up. _________________________________________________________ Additional small hypoechoic nodules in left thyroid lobe, largest measuring 0.5 cm. These  small hypoechoic nodules do not meet criteria for biopsy or dedicated follow-up. IMPRESSION: 1. Stable soft tissue at the right surgical bed. 2. Stable hypoechoic nodule in the inferior left thyroid lobe. No new suspicious thyroid nodules. The above is in keeping with the ACR TI-RADS recommendations - J Am Coll Radiol 2017;14:587-595. Electronically Signed   By: Markus Daft M.D.   On: 06/28/2019 07:53    Assessment & Plan:   Jahan was seen today for annual exam and hypothyroidism.  Diagnoses and all orders for this visit:  BPH associated with nocturia- His PSA is normal which is a reassuring sign that he does not have prostate cancer.  His symptoms are adequately well controlled with the peripheral alpha-blocker.  Postoperative hypothyroidism- His TSH is in the normal range.  He will stay on the current dose of levothyroxine. -     CBC with Differential/Platelet -     Basic metabolic panel -     TSH -     Hepatic function panel  Routine general medical examination at health care facility- Exam completed, labs reviewed, vaccines reviewed and updated, colon cancer screening is up-to-date, patient education was given. -     Lipid panel -     PSA -     HIV Antibody (routine testing w rflx)  Need for hepatitis C screening test -     Hepatitis C antibody  PAC (premature atrial contraction)- This is stable and asymptomatic.  No intervention is needed at this time. -     EKG 12-Lead  Premature atrial contraction  Depression with anxiety -     sertraline (ZOLOFT) 100 MG tablet; Take 1 tablet (100 mg total) by mouth at bedtime.  Tinnitus aurium, bilateral -     Ambulatory referral to ENT   I have discontinued Antonnio C. Shenefield "Steve"'s ibuprofen and Turmeric. I have also changed his sertraline. Additionally, I am having him maintain his multivitamin with minerals, aspirin, levothyroxine, and alfuzosin.  Meds ordered this encounter  Medications   sertraline (ZOLOFT) 100 MG tablet     Sig: Take 1 tablet (100 mg  total) by mouth at bedtime.    Dispense:  90 tablet    Refill:  1     Follow-up: Return in about 6 months (around 07/31/2020).  Scarlette Calico, MD

## 2020-01-29 NOTE — Patient Instructions (Signed)
Bradycardia, Adult Bradycardia is a slower-than-normal heartbeat. A normal resting heart rate for an adult ranges from 60 to 100 beats per minute. With bradycardia, the resting heart rate is less than 60 beats per minute. Bradycardia can prevent enough oxygen from reaching certain areas of your body when you are active. It can be serious if it keeps enough oxygen from reaching your brain and other parts of your body. Bradycardia is not a problem for everyone. For some healthy adults, a slow resting heart rate is normal. What are the causes? This condition may be caused by:  A problem with the heart, including: ? A problem with the heart's electrical system, such as a heart block. With a heart block, electrical signals between the chambers of the heart are partially or completely blocked, so they are not able to work as they should. ? A problem with the heart's natural pacemaker (sinus node). ? Heart disease. ? A heart attack. ? Heart damage. ? Lyme disease. ? A heart infection. ? A heart condition that is present at birth (congenital heart defect).  Certain medicines that treat heart conditions.  Certain conditions, such as hypothyroidism and obstructive sleep apnea.  Problems with the balance of chemicals and other substances, like potassium, in the blood.  Trauma.  Radiation therapy. What increases the risk? You are more likely to develop this condition if you:  Are age 65 or older.  Have high blood pressure (hypertension), high cholesterol (hyperlipidemia), or diabetes.  Drink heavily, use tobacco or nicotine products, or use drugs. What are the signs or symptoms? Symptoms of this condition include:  Light-headedness.  Feeling faint or fainting.  Fatigue and weakness.  Trouble with activity or exercise.  Shortness of breath.  Chest pain (angina).  Drowsiness.  Confusion.  Dizziness. How is this diagnosed? This condition may be diagnosed based on:  Your  symptoms.  Your medical history.  A physical exam. During the exam, your health care provider will listen to your heartbeat and check your pulse. To confirm the diagnosis, your health care provider may order tests, such as:  Blood tests.  An electrocardiogram (ECG). This test records the heart's electrical activity. The test can show how fast your heart is beating and whether the heartbeat is steady.  A test in which you wear a portable device (event recorder or Holter monitor) to record your heart's electrical activity while you go about your day.  Anexercise test. How is this treated? Treatment for this condition depends on the cause of the condition and how severe your symptoms are. Treatment may involve:  Treatment of the underlying condition.  Changing your medicines or how much medicine you take.  Having a small, battery-operated device called a pacemaker implanted under the skin. When bradycardia occurs, this device can be used to increase your heart rate and help your heart beat in a regular rhythm. Follow these instructions at home: Lifestyle   Manage any health conditions that contribute to bradycardia as told by your health care provider.  Follow a heart-healthy diet. A nutrition specialist (dietitian) can help educate you about healthy food options and changes.  Follow an exercise program that is approved by your health care provider.  Maintain a healthy weight.  Try to reduce or manage your stress, such as with yoga or meditation. If you need help reducing stress, ask your health care provider.  Do not use any products that contain nicotine or tobacco, such as cigarettes, e-cigarettes, and chewing tobacco. If you need help   quitting, ask your health care provider.  Do not use illegal drugs.  Limit alcohol intake to no more than 1 drink a day for nonpregnant women and 2 drinks a day for men. Be aware of how much alcohol is in your drink. In the [place] one drink  equals one 12 oz bottle of beer (355 mL), one 5 oz glass of wine (148 mL), or one 1 oz glass of hard liquor (44 mL). General instructions  Take over-the-counter and prescription medicines only as told by your health care provider.  Keep all follow-up visits as told by your health care provider. This is important. How is this prevented? In some cases, bradycardia may be prevented by:  Treating underlying medical problems.  Stopping behaviors or medicines that can trigger the condition. Contact a health care provider if you:  Feel light-headed or dizzy.  Almost faint.  Feel weak or are easily fatigued during physical activity.  Experience confusion or have memory problems. Get help right away if:  You faint.  You have: ? An irregular heartbeat (palpitations). ? Chest pain. ? Trouble breathing. Summary  Bradycardia is a slower-than-normal heartbeat. With bradycardia, the resting heart rate is less than 60 beats per minute.  Treatment for this condition depends on the cause.  Manage any health conditions that contribute to bradycardia as told by your health care provider.  Do not use any products that contain nicotine or tobacco, such as cigarettes, e-cigarettes, and chewing tobacco, and limit alcohol intake.  Keep all follow-up visits as told by your health care provider. This is important. This information is not intended to replace advice given to you by your health care provider. Make sure you discuss any questions you have with your health care provider. Document Revised: 05/22/2018 Document Reviewed: 04/19/2018 Elsevier Patient Education  2020 [employer]  

## 2020-01-30 ENCOUNTER — Encounter: Payer: Self-pay | Admitting: Internal Medicine

## 2020-01-30 LAB — HEPATITIS C ANTIBODY
Hepatitis C Ab: NONREACTIVE
SIGNAL TO CUT-OFF: 0.01 (ref <1.00)

## 2020-01-30 LAB — HIV ANTIBODY (ROUTINE TESTING W REFLEX): HIV 1&2 Ab, 4th Generation: NONREACTIVE

## 2020-01-31 ENCOUNTER — Telehealth: Payer: Self-pay

## 2020-01-31 DIAGNOSIS — H9313 Tinnitus, bilateral: Secondary | ICD-10-CM

## 2020-01-31 NOTE — Telephone Encounter (Signed)
Please. It looks like he went ahead and scheduled with Dr. Lucia Gaskins earlier this morning and the referral is attached to it.

## 2020-01-31 NOTE — Telephone Encounter (Signed)
New message    Can referral be change to [facility]?  due to insurance reason   The audiology is not on the plan at Dr. Pollie Friar office.

## 2020-02-12 MED FILL — LEVOTHYROXINE 88 MCG TABLET: 88 | 90 days supply | Qty: 97 | Fill #0

## 2020-02-14 ENCOUNTER — Ambulatory Visit (INDEPENDENT_AMBULATORY_CARE_PROVIDER_SITE_OTHER): Payer: No Typology Code available for payment source | Admitting: Otolaryngology

## 2020-02-25 ENCOUNTER — Encounter: Payer: Self-pay | Admitting: Internal Medicine

## 2020-02-25 NOTE — Addendum Note (Signed)
Addended by: Karle Barr on: 02/25/2020 09:15 PM   Modules accepted: Orders

## 2020-02-26 NOTE — Telephone Encounter (Signed)
Referral faxed to Dr. Deeann Saint office

## 2020-03-12 MED FILL — ALFUZOSIN HCL ER 10 MG TB24: 10 | 90 days supply | Qty: 90 | Fill #1

## 2020-04-01 ENCOUNTER — Other Ambulatory Visit: Payer: Self-pay | Admitting: Internal Medicine

## 2020-04-01 DIAGNOSIS — E041 Nontoxic single thyroid nodule: Secondary | ICD-10-CM

## 2020-04-03 ENCOUNTER — Ambulatory Visit
Admission: RE | Admit: 2020-04-03 | Discharge: 2020-04-03 | Disposition: A | Discharge status: Home / Self Care | Payer: No Typology Code available for payment source | Source: Ambulatory Visit | Attending: Internal Medicine | Admitting: Internal Medicine

## 2020-04-03 DIAGNOSIS — E041 Nontoxic single thyroid nodule: Secondary | ICD-10-CM

## 2020-04-07 ENCOUNTER — Other Ambulatory Visit: Payer: Self-pay | Admitting: Internal Medicine

## 2020-04-07 DIAGNOSIS — R351 Nocturia: Secondary | ICD-10-CM

## 2020-04-07 DIAGNOSIS — N401 Enlarged prostate with lower urinary tract symptoms: Secondary | ICD-10-CM

## 2020-04-14 MED FILL — SERTRALINE HCL 100 MG TAB: 100 | 90 days supply | Qty: 90 | Fill #0

## 2020-05-20 MED FILL — LEVOTHYROXINE 88 MCG TABLET: 88 | 90 days supply | Qty: 97 | Fill #1

## 2020-06-02 MED FILL — PENICILLIN VK 500 MG TABLET: 500 | 7 days supply | Qty: 28 | Fill #0

## 2020-06-02 MED FILL — HYDROCODON-APAP 5-325: 5-325 | 4 days supply | Qty: 14 | Fill #0

## 2020-06-23 MED FILL — ALFUZOSIN HCL ER 10 MG TB24: 10 | 90 days supply | Qty: 90 | Fill #0

## 2020-07-14 MED FILL — SERTRALINE HCL 100 MG TABS: 100 | 90 days supply | Qty: 90 | Fill #1

## 2020-07-15 ENCOUNTER — Other Ambulatory Visit: Payer: Self-pay | Admitting: Internal Medicine

## 2020-07-15 DIAGNOSIS — E041 Nontoxic single thyroid nodule: Secondary | ICD-10-CM

## 2020-08-14 ENCOUNTER — Other Ambulatory Visit (HOSPITAL_COMMUNITY): Payer: Self-pay | Admitting: Internal Medicine

## 2020-08-14 MED FILL — LEVOTHYROXINE 88 MCG TABLET: 88 | 90 days supply | Qty: 90 | Fill #0

## 2020-09-23 ENCOUNTER — Ambulatory Visit (INDEPENDENT_AMBULATORY_CARE_PROVIDER_SITE_OTHER): Payer: No Typology Code available for payment source | Admitting: Internal Medicine

## 2020-09-23 ENCOUNTER — Encounter: Payer: Self-pay | Admitting: Internal Medicine

## 2020-09-23 ENCOUNTER — Other Ambulatory Visit: Payer: Self-pay

## 2020-09-23 VITALS — BP 116/62 | HR 54 | Temp 98.0°F | Resp 16 | Ht 74.0 in | Wt 218.0 lb

## 2020-09-23 DIAGNOSIS — M1712 Unilateral primary osteoarthritis, left knee: Secondary | ICD-10-CM | POA: Diagnosis not present

## 2020-09-23 DIAGNOSIS — Z23 Encounter for immunization: Secondary | ICD-10-CM

## 2020-09-23 MED ORDER — METHYLPREDNISOLONE ACETATE 40 MG/ML IJ SUSP
40.0000 mg | Freq: Once | INTRAMUSCULAR | Status: AC
  Administered 2020-09-23: 40 mg via INTRA_ARTICULAR

## 2020-09-23 NOTE — Progress Notes (Signed)
Subjective:  Patient ID: Rickey Cochran, male    DOB: June 08, 1961  Age: 59 y.o. MRN: [id number]  CC: Osteoarthritis  This visit occurred during the SARS-CoV-2 public health emergency.  Safety protocols were in place, including screening questions prior to the visit, additional usage of staff PPE, and extensive cleaning of exam room while observing appropriate contact time as indicated for disinfecting solutions.    HPI Rickey Cochran presents for concerns about his left knee - He complains of a several week hx of non-traumatic pain in the left knee.   Outpatient Medications Prior to Visit  Medication Sig Dispense Refill  . alfuzosin (UROXATRAL) 10 MG 24 hr tablet TAKE 1 TABLET BY MOUTH ONCE A DAY WITH BREAKFAST 90 tablet 1  . levothyroxine (SYNTHROID) 88 MCG tablet     . Multiple Vitamin (MULTIVITAMIN WITH MINERALS) TABS tablet Take 1 tablet by mouth 3 (three) times daily.    . sertraline (ZOLOFT) 100 MG tablet Take 1 tablet (100 mg total) by mouth at bedtime. 90 tablet 1  . aspirin 81 MG chewable tablet Chew 1 tablet (81 mg total) by mouth 2 (two) times daily. 30 tablet 0   No facility-administered medications prior to visit.    ROS Review of Systems  Constitutional: Negative for diaphoresis, fatigue and fever.  HENT: Negative.   Respiratory: Negative for cough and chest tightness.   Gastrointestinal: Negative for abdominal pain.  Endocrine: Negative.   Genitourinary: Negative.  Negative for difficulty urinating.  Musculoskeletal: Positive for arthralgias. Negative for myalgias and neck pain.  Skin: Negative.   Neurological: Negative.  Negative for dizziness and weakness.  Hematological: Negative for adenopathy. Does not bruise/bleed easily.  Psychiatric/Behavioral: Negative.     Objective:  BP 116/62   Pulse (!) 54   Temp 98 F (36.7 C) (Oral)   Resp 16   Ht 6\' 2"  (1.88 m)   Wt 218 lb (98.9 kg)   SpO2 98%   BMI 27.99 kg/m   BP Readings from Last 3 Encounters:   09/23/20 116/62  01/29/20 130/78  05/23/19 126/82    Wt Readings from Last 3 Encounters:  09/23/20 218 lb (98.9 kg)  01/29/20 218 lb (98.9 kg)  05/23/19 216 lb 12 oz (98.3 kg)    Physical Exam Musculoskeletal:     Right knee: Normal.     Left knee: Deformity (DJD) and bony tenderness present. No swelling or crepitus. Normal range of motion. No tenderness. Normal alignment.     Comments: There is TTP over the medial aspect of the patella     Lab Results  Component Value Date   WBC 5.8 01/29/2020   HGB 15.1 01/29/2020   HCT 44.3 01/29/2020   PLT 223.0 01/29/2020   GLUCOSE 90 01/29/2020   CHOL 208 (H) 01/29/2020   TRIG 93.0 01/29/2020   HDL 52.70 01/29/2020   LDLCALC 136 (H) 01/29/2020   ALT 23 01/29/2020   AST 21 01/29/2020   NA 139 01/29/2020   K 4.0 01/29/2020   CL 106 01/29/2020   CREATININE 0.77 01/29/2020   BUN 11 01/29/2020   CO2 27 01/29/2020   TSH 1.00 01/29/2020   PSA 1.41 01/29/2020   HGBA1C 5.3 05/23/2019    US THYROID  Result Date: 04/03/2020 CLINICAL DATA:  History of thyroid cancer and right hemithyroidectomy. Previous biopsy of soft tissue at the right surgical bed on 09/12/2018. EXAM: THYROID ULTRASOUND TECHNIQUE: Ultrasound examination of the thyroid gland and adjacent soft tissues was performed. COMPARISON:  06/27/2019 and 03/29/2018 FINDINGS: Parenchymal Echotexture: Mildly heterogenous Isthmus: 0.3 cm, previously 0.3 cm Right lobe: Surgically removed Left lobe: 5.3 x 1.8 x 2.2 cm, previously 5.4 x 1.8 x 1.8 cm _________________________________________________________ Estimated total number of nodules >/= 1 cm: 2 Number of spongiform nodules >/=  2 cm not described below (TR1): 0 Number of mixed cystic and solid nodules >/= 1.5 cm not described below ([place]): 0 _________________________________________________________ Soft tissue at the right surgical thyroidectomy bed is again noted. Soft tissue is mildly heterogeneous and measures 1.8 x 1.0 x 1.0 cm.  This soft tissue previously measured 1.7 x 0.7 x 1.1 cm. This soft tissue is very vascular. Left thyroid lobe is mildly heterogeneous. Nodule # 1: Prior biopsy: No Location: Left; Inferior Maximum size: 1.0 cm; Other 2 dimensions: 0.7 x 1.1 cm, previously, 0.9 x 0.6 x 0.8 cm Composition: solid/almost completely solid (2) Echogenicity: hypoechoic (2) Shape: not taller-than-wide (0) Margins: ill-defined (0) Echogenic foci: none (0) ACR TI-RADS total points: 4. ACR TI-RADS risk category:  TR4 (4-6 points). Significant change in size (>/= 20% in two dimensions and minimal increase of 2 mm): No Change in features: No Change in ACR TI-RADS risk category: No ACR TI-RADS recommendations: *Given size (>/= 1 - 1.4 cm) and appearance, a follow-up ultrasound in 1 year should be considered based on TI-RADS criteria. This nodule measured up to 1.0 cm in 2019 and has minimally changed. _________________________________________________________ IMPRESSION: 1. Stable soft tissue in the right hemithyroidectomy surgical bed. No significant change from the exam in 2020. 2. Stable nodule in the inferior left thyroid lobe. This nodule has not significantly changed since 2019. This is a TR 4 nodule that meets criteria for annual follow-up until there is at least 5 years of stability. 3. No new suspicious thyroid nodules. The above is in keeping with the ACR TI-RADS recommendations - J Am Coll Radiol 2017;14:587-595. Electronically Signed   By: Markus Daft M.D.   On: 04/03/2020 16:25    After verbal consent was obtained, using sterile technique the left knee was prepped with betadine and plain Lidocaine 1% w epi was used as local anesthetic. The joint was entered thru the anterior/medial approach and no fluid was withdrawn.  Steroid (40mg  depo-medrol) and 1 ml plain Lidocaine was then injected and the needle withdrawn.  The procedure was well tolerated.  The patient is asked to continue to rest the joint for a few more days before  resuming regular activities.  It may be more painful for the first 1-2 days.    Assessment & Plan:   Rickey Cochran was seen today for osteoarthritis.  Diagnoses and all orders for this visit:  Primary osteoarthritis of left knee- Will treat with an inj of depo-medrol. He will rest/ice for 1-2 days. -     methylPREDNISolone acetate (DEPO-MEDROL) injection 40 mg  Other orders -     Flu Vaccine QUAD 6+ mos PF IM (Fluarix Quad PF)   I have discontinued Annie Main C. Chuang "Steve"'s aspirin. I am also having him maintain his multivitamin with minerals, levothyroxine, sertraline, and alfuzosin. We administered methylPREDNISolone acetate.  Meds ordered this encounter  Medications  . methylPREDNISolone acetate (DEPO-MEDROL) injection 40 mg     Follow-up: No follow-ups on file.  Scarlette Calico, MD

## 2020-09-26 ENCOUNTER — Encounter: Payer: Self-pay | Admitting: Internal Medicine

## 2020-09-27 MED FILL — ALFUZOSIN HCL ER 10 MG TAB: 10 | 90 days supply | Qty: 90 | Fill #1

## 2020-10-17 ENCOUNTER — Other Ambulatory Visit: Payer: Self-pay | Admitting: Internal Medicine

## 2020-10-17 DIAGNOSIS — F418 Other specified anxiety disorders: Secondary | ICD-10-CM

## 2020-10-17 MED FILL — SERTRALINE HCL 100 MG TABS: 100 | 90 days supply | Qty: 90 | Fill #0

## 2020-11-25 MED FILL — LEVOTHYROXINE 88 MCG TABLET: 88 | 90 days supply | Qty: 90 | Fill #1

## 2020-12-03 ENCOUNTER — Other Ambulatory Visit: Payer: No Typology Code available for payment source

## 2020-12-03 ENCOUNTER — Other Ambulatory Visit: Payer: Self-pay

## 2020-12-03 DIAGNOSIS — Z20822 Contact with and (suspected) exposure to covid-19: Secondary | ICD-10-CM

## 2020-12-05 LAB — SARS-COV-2, NAA 2 DAY TAT

## 2020-12-05 LAB — NOVEL CORONAVIRUS, NAA: SARS-CoV-2, NAA: DETECTED — AB

## 2020-12-06 ENCOUNTER — Other Ambulatory Visit: Payer: Self-pay | Admitting: Adult Health

## 2020-12-06 ENCOUNTER — Encounter: Payer: Self-pay | Admitting: Adult Health

## 2020-12-06 DIAGNOSIS — U071 COVID-19: Secondary | ICD-10-CM

## 2020-12-06 MED ORDER — MOLNUPIRAVIR EUA 200MG CAPSULE: 4.0000 | ORAL_CAPSULE | Freq: Two times a day (BID) | ORAL | 0 refills | Status: AC

## 2020-12-06 MED FILL — MOLNUPIRAVIR 200 MG CAPS: 200 | 5 days supply | Qty: 40 | Fill #0

## 2020-12-06 NOTE — Progress Notes (Signed)
Outpatient Oral COVID Treatment Note  I connected with Rickey Cochran on 12/06/2020/2:07 PM by telephone and verified that I am speaking with the correct person using two identifiers.  I discussed the limitations, risks, security, and privacy concerns of performing an evaluation and management service by telephone and the availability of in person appointments. I also discussed with the patient that there may be a patient responsible charge related to this service. The patient expressed understanding and agreed to proceed.  Patient location: home Provider location: home  Diagnosis: COVID-19 infection  Purpose of visit: Discussion of potential use of Molnupiravir or Paxlovid, a new treatment for mild to moderate COVID-19 viral infection in non-hospitalized patients.   Subjective: Patient is a 60 y.o. male who has been diagnosed with COVID 19 viral infection.  Their symptoms began on 4 days ago.  He has sore throat, achiness, and weakness.    Past Medical History:  Diagnosis Date  . Arthritis   . Cancer [facility])    possible thyroid cancer, now s/p partial thyroidectomy   . Depression   . Hypertension    several years ago was on lisiopril but lost weight and no longer needs  . Hypothyroidism    mgd on levothyroxine   . Left-sided tinnitus   . Premature atrial contraction    was seen on EKG at his PCP approx 8 years ago, denies any symptoms, reports he was a runner before onset of hip pain in feb 2020    No Known Allergies   Current Outpatient Medications:  .  alfuzosin (UROXATRAL) 10 MG 24 hr tablet, TAKE 1 TABLET BY MOUTH ONCE A DAY WITH BREAKFAST, Disp: 90 tablet, Rfl: 1 .  levothyroxine (SYNTHROID) 88 MCG tablet, , Disp: , Rfl:  .  Multiple Vitamin (MULTIVITAMIN WITH MINERALS) TABS tablet, Take 1 tablet by mouth 3 (three) times daily., Disp: , Rfl:  .  sertraline (ZOLOFT) 100 MG tablet, TAKE 1 TABLET BY MOUTH EVERY NIGHT AT BEDTIME, Disp: 90 tablet, Rfl: 1  Objective: Patient  appears/sounds like he doesn't feel well.  They are in no apparent distress.  Breathing is non labored.  Mood and behavior are normal.  Laboratory Data:  Recent Results (from the past 2160 hour(s))  Novel Coronavirus, NAA ([facility])     Status: Abnormal   Collection Time: 12/03/20 11:08 AM   Specimen: Nasopharyngeal(NP) swabs in vial transport medium   Nasopharynge  Result Value Ref Range   SARS-CoV-2, NAA Detected (A) Not Detected    Comment: Patients who have a positive COVID-19 test result may now have treatment options. Treatment options are available for patients with mild to moderate symptoms and for hospitalized patients. Visit our website at [email or web address] for resources and information. This nucleic acid amplification test was developed and its performance characteristics determined by [employer]. Nucleic acid amplification tests include RT-PCR and TMA. This test has not been FDA cleared or approved. This test has been authorized by FDA under an Emergency Use Authorization (EUA). This test is only authorized for the duration of time the declaration that circumstances exist justifying the authorization of the emergency use of in vitro diagnostic tests for detection of SARS-CoV-2 virus and/or diagnosis of COVID-19 infection under section 564(b)(1) of the Act, 21 U.S.C. 563OVF-6(E) (1), unless the authorization is terminated or revoked sooner. When diagnostic testing is negativ e, the possibility of a false negative result should be considered in the context of a patient's recent exposures and the presence of clinical signs and symptoms consistent  with COVID-19. An individual without symptoms of COVID-19 and who is not shedding SARS-CoV-2 virus would expect to have a negative (not detected) result in this assay.   SARS-COV-2, NAA 2 DAY TAT     Status: None   Collection Time: 12/03/20 11:08 AM   Nasopharynge  Result Value Ref Range   SARS-CoV-2, NAA  2 DAY TAT Performed      Assessment: 60 y.o. male with mild/moderate COVID 19 viral infection diagnosed on 12/04/2020 at high risk for progression to severe COVID 19.  Plan:  This patient is a 60 y.o. male that meets the following criteria for Emergency Use Authorization of: Molnupiravir  1. Age >18 yr 2. SARS-COV-2 positive test 3. Symptom onset < 5 days 4. Mild-to-moderate COVID disease with high risk for severe progression to hospitalization or death   I have spoken and communicated the following to the patient or parent/caregiver regarding: 1. Molnupiravir is an unapproved drug that is authorized for use under an [employer] production planner.  2. There are no adequate, approved, available products for the treatment of COVID-19 in adults who have mild-to-moderate COVID-19 and are at high risk for progressing to severe COVID-19, including hospitalization or death. 3. Other therapeutics are currently authorized. For additional information on all products authorized for treatment or prevention of COVID-19, please see [facility].  4. There are benefits and risks of taking this treatment as outlined in the "Fact Sheet for Patients and Caregivers."  5. "Fact Sheet for Patients and Caregivers" was reviewed with patient. A hard copy will be provided to patient from pharmacy prior to the patient receiving treatment. 6. Patients should continue to self-isolate and use infection control measures (e.g., wear mask, isolate, social distance, avoid sharing personal items, clean and disinfect "high touch" surfaces, and frequent handwashing) according to CDC guidelines.  7. The patient or parent/caregiver has the option to accept or refuse treatment. 8. [place] has established a pregnancy surveillance program. 9. Females of childbearing potential should use a reliable method of  contraception correctly and consistently, as applicable, for the duration of treatment and for 4 days after the last dose of Molnupiravir. 62. Males of reproductive potential who are sexually active with females of childbearing potential should use a reliable method of contraception correctly and consistently during treatment and for at least 3 months after the last dose. 11. Pregnancy status and risk was assessed. Patient verbalized understanding of precautions.   After reviewing above information with the patient, the patient agrees to receive molnupiravir.  Follow up instructions:    . Take prescription BID x 5 days as directed . Reach out to pharmacist for counseling on medication if desired . For concerns regarding further COVID symptoms please follow up with your PCP or urgent care . For urgent or life-threatening issues, seek care at your local emergency department  The patient was provided an opportunity to ask questions, and all were answered. The patient agreed with the plan and demonstrated an understanding of the instructions.   Script sent to [facility] and opted to pick up RX.  The patient was advised to call their PCP or seek an in-person evaluation if the symptoms worsen or if the condition fails to improve as anticipated.   I provided 15 minutes of non face-to-face telephone visit time during this encounter, and > 50% was spent counseling as documented under my assessment & plan.  Scot Dock, NP 12/06/2020 /2:07 PM

## 2021-01-19 ENCOUNTER — Other Ambulatory Visit: Payer: Self-pay | Admitting: Internal Medicine

## 2021-01-19 DIAGNOSIS — R351 Nocturia: Secondary | ICD-10-CM

## 2021-01-19 DIAGNOSIS — N401 Enlarged prostate with lower urinary tract symptoms: Secondary | ICD-10-CM

## 2021-01-19 MED FILL — SERTRALINE HCL 100 MG TABS: 100 | 90 days supply | Qty: 90 | Fill #1

## 2021-01-19 MED FILL — ALFUZOSIN HCL ER 10 MG TAB: 10 | 90 days supply | Qty: 90 | Fill #0

## 2021-02-12 ENCOUNTER — Other Ambulatory Visit (HOSPITAL_COMMUNITY): Payer: Self-pay | Admitting: Internal Medicine

## 2021-02-12 MED FILL — LEVOTHYROXINE 88 MCG TABLET: 88 | 90 days supply | Qty: 90 | Fill #0

## 2021-02-13 ENCOUNTER — Other Ambulatory Visit (HOSPITAL_BASED_OUTPATIENT_CLINIC_OR_DEPARTMENT_OTHER): Payer: Self-pay

## 2021-02-17 ENCOUNTER — Other Ambulatory Visit: Payer: Self-pay | Admitting: Internal Medicine

## 2021-02-17 ENCOUNTER — Encounter: Payer: Self-pay | Admitting: Internal Medicine

## 2021-02-17 ENCOUNTER — Other Ambulatory Visit: Payer: Self-pay

## 2021-02-17 ENCOUNTER — Ambulatory Visit (INDEPENDENT_AMBULATORY_CARE_PROVIDER_SITE_OTHER): Payer: No Typology Code available for payment source | Admitting: Internal Medicine

## 2021-02-17 VITALS — BP 116/70 | HR 50 | Temp 98.2°F | Ht 74.0 in | Wt 220.0 lb

## 2021-02-17 DIAGNOSIS — N401 Enlarged prostate with lower urinary tract symptoms: Secondary | ICD-10-CM

## 2021-02-17 DIAGNOSIS — R001 Bradycardia, unspecified: Secondary | ICD-10-CM | POA: Insufficient documentation

## 2021-02-17 DIAGNOSIS — R5382 Chronic fatigue, unspecified: Secondary | ICD-10-CM | POA: Diagnosis not present

## 2021-02-17 DIAGNOSIS — E890 Postprocedural hypothyroidism: Secondary | ICD-10-CM | POA: Diagnosis not present

## 2021-02-17 DIAGNOSIS — M19071 Primary osteoarthritis, right ankle and foot: Secondary | ICD-10-CM | POA: Insufficient documentation

## 2021-02-17 DIAGNOSIS — Z0000 Encounter for general adult medical examination without abnormal findings: Secondary | ICD-10-CM

## 2021-02-17 DIAGNOSIS — Z1211 Encounter for screening for malignant neoplasm of colon: Secondary | ICD-10-CM

## 2021-02-17 DIAGNOSIS — R351 Nocturia: Secondary | ICD-10-CM | POA: Diagnosis not present

## 2021-02-17 DIAGNOSIS — R0683 Snoring: Secondary | ICD-10-CM | POA: Insufficient documentation

## 2021-02-17 DIAGNOSIS — M170 Bilateral primary osteoarthritis of knee: Secondary | ICD-10-CM

## 2021-02-17 DIAGNOSIS — M19072 Primary osteoarthritis, left ankle and foot: Secondary | ICD-10-CM

## 2021-02-17 DIAGNOSIS — I491 Atrial premature depolarization: Secondary | ICD-10-CM

## 2021-02-17 LAB — BASIC METABOLIC PANEL
BUN: 13 mg/dL (ref 6–23)
CO2: 28 mEq/L (ref 19–32)
Calcium: 9.2 mg/dL (ref 8.4–10.5)
Chloride: 104 mEq/L (ref 96–112)
Creatinine, Ser: 0.84 mg/dL (ref 0.40–1.50)
GFR: 95 mL/min (ref >60.00)
Glucose, Bld: 104 mg/dL — ABNORMAL HIGH (ref 70–99)
Potassium: 3.9 mEq/L (ref 3.5–5.1)
Sodium: 140 mEq/L (ref 135–145)

## 2021-02-17 LAB — CBC WITH DIFFERENTIAL/PLATELET
Basophils Absolute: 0 10*3/uL (ref 0.0–0.1)
Basophils Relative: 0.4 % (ref 0.0–3.0)
Eosinophils Absolute: 0.1 10*3/uL (ref 0.0–0.7)
Eosinophils Relative: 1.6 % (ref 0.0–5.0)
HCT: 45.3 % (ref 39.0–52.0)
Hemoglobin: 15.7 g/dL (ref 13.0–17.0)
Lymphocytes Relative: 23.9 % (ref 12.0–46.0)
Lymphs Abs: 1.8 10*3/uL (ref 0.7–4.0)
MCHC: 34.6 g/dL (ref 30.0–36.0)
MCV: 92.7 fl (ref 78.0–100.0)
Monocytes Absolute: 0.4 10*3/uL (ref 0.1–1.0)
Monocytes Relative: 5.3 % (ref 3.0–12.0)
Neutro Abs: 5 10*3/uL (ref 1.4–7.7)
Neutrophils Relative %: 68.8 % (ref 43.0–77.0)
Platelets: 217 10*3/uL (ref 150.0–400.0)
RBC: 4.89 Mil/uL (ref 4.22–5.81)
RDW: 12.8 % (ref 11.5–15.5)
WBC: 7.3 10*3/uL (ref 4.0–10.5)

## 2021-02-17 LAB — LIPID PANEL
Cholesterol: 243 mg/dL — ABNORMAL HIGH (ref 0–200)
HDL: 48 mg/dL (ref >39.00)
NonHDL: 194.87
Total CHOL/HDL Ratio: 5
Triglycerides: 287 mg/dL — ABNORMAL HIGH (ref 0.0–149.0)
VLDL: 57.4 mg/dL — ABNORMAL HIGH (ref 0.0–40.0)

## 2021-02-17 LAB — HEPATIC FUNCTION PANEL
ALT: 21 U/L (ref 0–53)
AST: 19 U/L (ref 0–37)
Albumin: 4.3 g/dL (ref 3.5–5.2)
Alkaline Phosphatase: 48 U/L (ref 39–117)
Bilirubin, Direct: 0.1 mg/dL (ref 0.0–0.3)
Total Bilirubin: 0.9 mg/dL (ref 0.2–1.2)
Total Protein: 6.5 g/dL (ref 6.0–8.3)

## 2021-02-17 LAB — PSA: PSA: 1.57 ng/mL (ref 0.10–4.00)

## 2021-02-17 LAB — TSH: TSH: 1.52 u[IU]/mL (ref 0.35–4.50)

## 2021-02-17 MED ORDER — MELOXICAM 15 MG PO TABS
15.0000 mg | ORAL_TABLET | Freq: Every day | ORAL | 1 refills | Status: DC
Start: 2021-02-17 — End: 2021-02-17

## 2021-02-17 MED FILL — MELOXICAM 15 MG TABLET: 15 | 90 days supply | Qty: 90 | Fill #0

## 2021-02-17 NOTE — Progress Notes (Signed)
Subjective:  Patient ID: Rickey Cochran, male    DOB: 11/05/1961  Age: 60 y.o. MRN: [id number]  CC: Annual Exam  This visit occurred during the SARS-CoV-2 public health emergency.  Safety protocols were in place, including screening questions prior to the visit, additional usage of staff PPE, and extensive cleaning of exam room while observing appropriate contact time as indicated for disinfecting solutions.    HPI Rickey Cochran presents for a CPX and f/up -   He complains of a several month history of worsening pain in his feet over the MTP joints.  He denies trauma or injury.  He has chronic knee pain from osteoarthritis.  He takes Motrin a couple times a day to control the pain.  His wife complains about his snoring and she thinks he has sleep apnea.  He complains of fatigue.  He is also concerned about his low heart rate and says he cannot exert like he used to.  He does not experience any ischemic symptoms with exertion like CP, DOE, diaphoresis, dizziness, or lightheadedness.  Outpatient Medications Prior to Visit  Medication Sig Dispense Refill  . alfuzosin (UROXATRAL) 10 MG 24 hr tablet TAKE 1 TABLET BY MOUTH ONCE DAILY WITH BREAKFAST 90 tablet 1  . levothyroxine (SYNTHROID) 88 MCG tablet     . Multiple Vitamin (MULTIVITAMIN WITH MINERALS) TABS tablet Take 1 tablet by mouth 3 (three) times daily.    . sertraline (ZOLOFT) 100 MG tablet TAKE 1 TABLET BY MOUTH EVERY NIGHT AT BEDTIME 90 tablet 1   No facility-administered medications prior to visit.    ROS Review of Systems  Constitutional: Positive for fatigue. Negative for diaphoresis.  HENT: Negative.   Eyes: Negative for visual disturbance.  Respiratory: Positive for apnea. Negative for cough, chest tightness, shortness of breath and wheezing.   Cardiovascular: Negative for chest pain, palpitations and leg swelling.  Gastrointestinal: Negative for abdominal pain, blood in stool, constipation, diarrhea and nausea.   Endocrine: Negative for cold intolerance and heat intolerance.  Genitourinary: Negative.  Negative for difficulty urinating and dysuria.  Musculoskeletal: Positive for arthralgias. Negative for back pain and myalgias.  Skin: Negative for color change, pallor and rash.  Neurological: Negative.  Negative for dizziness, weakness, light-headedness, numbness and headaches.  Hematological: Negative for adenopathy. Does not bruise/bleed easily.  Psychiatric/Behavioral: Negative.     Objective:  BP 116/70   Pulse (!) 50   Temp 98.2 F (36.8 C) (Oral)   Ht 6\' 2"  (1.88 m)   Wt 220 lb (99.8 kg)   SpO2 98%   BMI 28.25 kg/m   BP Readings from Last 3 Encounters:  02/17/21 116/70  09/23/20 116/62  01/29/20 130/78    Wt Readings from Last 3 Encounters:  02/17/21 220 lb (99.8 kg)  09/23/20 218 lb (98.9 kg)  01/29/20 218 lb (98.9 kg)    Physical Exam Vitals reviewed.  HENT:     Nose: Nose normal.     Mouth/Throat:     Mouth: Mucous membranes are moist.  Eyes:     General: No scleral icterus.    Conjunctiva/sclera: Conjunctivae normal.  Cardiovascular:     Rate and Rhythm: Regular rhythm. Bradycardia present.     Heart sounds: No murmur heard.   Pulmonary:     Effort: Pulmonary effort is normal.     Breath sounds: No stridor. No wheezing, rhonchi or rales.  Abdominal:     General: Abdomen is flat. Bowel sounds are normal. There is no distension.  Palpations: Abdomen is soft. There is no hepatomegaly, splenomegaly or mass.     Tenderness: There is no abdominal tenderness.  Genitourinary:    Comments: GU/rectal exam deferred at pt's request Musculoskeletal:        General: Normal range of motion.     Cervical back: Neck supple.     Right lower leg: No edema.     Left lower leg: No edema.  Skin:    General: Skin is warm and dry.  Neurological:     General: No focal deficit present.  Psychiatric:        Mood and Affect: Mood normal.        Behavior: Behavior normal.      Lab Results  Component Value Date   WBC 7.3 02/17/2021   HGB 15.7 02/17/2021   HCT 45.3 02/17/2021   PLT 217.0 02/17/2021   GLUCOSE 104 (H) 02/17/2021   CHOL 243 (H) 02/17/2021   TRIG 287.0 (H) 02/17/2021   HDL 48.00 02/17/2021   LDLDIRECT 151.0 02/17/2021   LDLCALC 136 (H) 01/29/2020   ALT 21 02/17/2021   AST 19 02/17/2021   NA 140 02/17/2021   K 3.9 02/17/2021   CL 104 02/17/2021   CREATININE 0.84 02/17/2021   BUN 13 02/17/2021   CO2 28 02/17/2021   TSH 1.52 02/17/2021   PSA 1.57 02/17/2021   HGBA1C 5.3 05/23/2019    US THYROID  Result Date: 04/03/2020 CLINICAL DATA:  History of thyroid cancer and right hemithyroidectomy. Previous biopsy of soft tissue at the right surgical bed on 09/12/2018. EXAM: THYROID ULTRASOUND TECHNIQUE: Ultrasound examination of the thyroid gland and adjacent soft tissues was performed. COMPARISON:  06/27/2019 and 03/29/2018 FINDINGS: Parenchymal Echotexture: Mildly heterogenous Isthmus: 0.3 cm, previously 0.3 cm Right lobe: Surgically removed Left lobe: 5.3 x 1.8 x 2.2 cm, previously 5.4 x 1.8 x 1.8 cm _________________________________________________________ Estimated total number of nodules >/= 1 cm: 2 Number of spongiform nodules >/=  2 cm not described below (TR1): 0 Number of mixed cystic and solid nodules >/= 1.5 cm not described below ([place]): 0 _________________________________________________________ Soft tissue at the right surgical thyroidectomy bed is again noted. Soft tissue is mildly heterogeneous and measures 1.8 x 1.0 x 1.0 cm. This soft tissue previously measured 1.7 x 0.7 x 1.1 cm. This soft tissue is very vascular. Left thyroid lobe is mildly heterogeneous. Nodule # 1: Prior biopsy: No Location: Left; Inferior Maximum size: 1.0 cm; Other 2 dimensions: 0.7 x 1.1 cm, previously, 0.9 x 0.6 x 0.8 cm Composition: solid/almost completely solid (2) Echogenicity: hypoechoic (2) Shape: not taller-than-wide (0) Margins: ill-defined (0) Echogenic  foci: none (0) ACR TI-RADS total points: 4. ACR TI-RADS risk category:  TR4 (4-6 points). Significant change in size (>/= 20% in two dimensions and minimal increase of 2 mm): No Change in features: No Change in ACR TI-RADS risk category: No ACR TI-RADS recommendations: *Given size (>/= 1 - 1.4 cm) and appearance, a follow-up ultrasound in 1 year should be considered based on TI-RADS criteria. This nodule measured up to 1.0 cm in 2019 and has minimally changed. _________________________________________________________ IMPRESSION: 1. Stable soft tissue in the right hemithyroidectomy surgical bed. No significant change from the exam in 2020. 2. Stable nodule in the inferior left thyroid lobe. This nodule has not significantly changed since 2019. This is a TR 4 nodule that meets criteria for annual follow-up until there is at least 5 years of stability. 3. No new suspicious thyroid nodules. The above is in keeping  with the ACR TI-RADS recommendations - J Am Coll Radiol 2017;14:587-595. Electronically Signed   By: Markus Daft M.D.   On: 04/03/2020 16:25    Assessment & Plan:   Kamerin was seen today for annual exam.  Diagnoses and all orders for this visit:  Postoperative hypothyroidism- His TSH is in the normal range.  He will stay on the current dose of levothyroxine. -     TSH; Future -     Hepatic function panel; Future -     Ambulatory referral to Endocrinology -     Hepatic function panel -     TSH  BPH associated with nocturia- His PSA is low which is a reassuring sign that he does not have prostate cancer.  He has no symptoms that need to be treated. -     Urinalysis, Routine w reflex microscopic; Future -     Urinalysis, Routine w reflex microscopic  Routine general medical examination at health care facility- Exam completed, labs reviewed, vaccines reviewed and updated, cancer screenings addressed, patient education was given. -     Lipid panel; Future -     PSA; Future -     PSA -      Lipid panel  Premature atrial contraction  Chronic sinus bradycardia- I recommended that he undergo a cardiac event monitor to see if chronotropic incompetence is contributing to his symptoms. -     Cancel: CARDIAC EVENT MONITOR; Future -     CARDIAC EVENT MONITOR; Future  Chronic fatigue- I am concerned this is related to mild hypogonadism.  He tells me he does not want to start a testosterone replacement therapy. -     CBC with Differential/Platelet; Future -     Basic metabolic panel; Future -     Hepatic function panel; Future -     Testosterone Total,Free,Bio, Males; Future -     Cancel: CARDIAC EVENT MONITOR; Future -     Testosterone Total,Free,Bio, Males -     Hepatic function panel -     Basic metabolic panel -     CBC with Differential/Platelet -     CARDIAC EVENT MONITOR; Future  Loud snoring -     Ambulatory referral to Sleep Studies  Primary osteoarthritis of both knees -     meloxicam (MOBIC) 15 MG tablet; Take 1 tablet (15 mg total) by mouth daily.  Primary osteoarthritis of both feet -     Ambulatory referral to Podiatry  Other orders -     LDL cholesterol, direct   I am having Rickey Cochran "[name]" start on meloxicam. I am also having him maintain his multivitamin with minerals, levothyroxine, sertraline, and alfuzosin.  Meds ordered this encounter  Medications  . meloxicam (MOBIC) 15 MG tablet    Sig: Take 1 tablet (15 mg total) by mouth daily.    Dispense:  90 tablet    Refill:  1     Follow-up: Return in about 6 months (around 08/20/2021).  Scarlette Calico, MD

## 2021-02-17 NOTE — Patient Instructions (Signed)

## 2021-02-18 LAB — LDL CHOLESTEROL, DIRECT: Direct LDL: 151 mg/dL

## 2021-02-18 LAB — URINALYSIS, ROUTINE W REFLEX MICROSCOPIC
Bilirubin Urine: NEGATIVE
Hgb urine dipstick: NEGATIVE
Ketones, ur: NEGATIVE
Leukocytes,Ua: NEGATIVE
Nitrite: NEGATIVE
RBC / HPF: NONE SEEN (ref 0–?)
Specific Gravity, Urine: 1.015 (ref 1.000–1.030)
Total Protein, Urine: NEGATIVE
Urine Glucose: NEGATIVE
Urobilinogen, UA: 0.2 (ref 0.0–1.0)
WBC, UA: NONE SEEN (ref 0–?)
pH: 6 (ref 5.0–8.0)

## 2021-02-18 LAB — TESTOSTERONE TOTAL,FREE,BIO, MALES
Albumin: 4 g/dL (ref 3.6–5.1)
Sex Hormone Binding: 35 nmol/L (ref 22–77)
Testosterone, Bioavailable: 76.5 ng/dL — ABNORMAL LOW (ref >=110.0)
Testosterone, Free: 41.6 pg/mL — ABNORMAL LOW (ref 46.0–224.0)
Testosterone: 331 ng/dL (ref 250–827)

## 2021-02-20 DIAGNOSIS — Z1211 Encounter for screening for malignant neoplasm of colon: Secondary | ICD-10-CM | POA: Insufficient documentation

## 2021-02-20 NOTE — Addendum Note (Signed)
Addended by: Hinda Kehr on: 02/20/2021 01:11 PM   Modules accepted: Orders

## 2021-02-25 ENCOUNTER — Ambulatory Visit (INDEPENDENT_AMBULATORY_CARE_PROVIDER_SITE_OTHER): Payer: No Typology Code available for payment source | Admitting: Podiatry

## 2021-02-25 ENCOUNTER — Other Ambulatory Visit (HOSPITAL_COMMUNITY): Payer: Self-pay

## 2021-02-25 ENCOUNTER — Ambulatory Visit: Payer: No Typology Code available for payment source

## 2021-02-25 ENCOUNTER — Ambulatory Visit (INDEPENDENT_AMBULATORY_CARE_PROVIDER_SITE_OTHER): Payer: No Typology Code available for payment source

## 2021-02-25 ENCOUNTER — Other Ambulatory Visit: Payer: Self-pay

## 2021-02-25 DIAGNOSIS — E039 Hypothyroidism, unspecified: Secondary | ICD-10-CM | POA: Insufficient documentation

## 2021-02-25 DIAGNOSIS — M79671 Pain in right foot: Secondary | ICD-10-CM

## 2021-02-25 DIAGNOSIS — C73 Malignant neoplasm of thyroid gland: Secondary | ICD-10-CM | POA: Insufficient documentation

## 2021-02-25 DIAGNOSIS — M21622 Bunionette of left foot: Secondary | ICD-10-CM

## 2021-02-25 DIAGNOSIS — S9030XA Contusion of unspecified foot, initial encounter: Secondary | ICD-10-CM

## 2021-02-25 DIAGNOSIS — M79672 Pain in left foot: Secondary | ICD-10-CM | POA: Diagnosis not present

## 2021-02-25 DIAGNOSIS — M21621 Bunionette of right foot: Secondary | ICD-10-CM | POA: Diagnosis not present

## 2021-02-25 DIAGNOSIS — Z85850 Personal history of malignant neoplasm of thyroid: Secondary | ICD-10-CM | POA: Insufficient documentation

## 2021-02-25 DIAGNOSIS — M7751 Other enthesopathy of right foot: Secondary | ICD-10-CM | POA: Diagnosis not present

## 2021-02-25 DIAGNOSIS — E042 Nontoxic multinodular goiter: Secondary | ICD-10-CM | POA: Insufficient documentation

## 2021-02-25 DIAGNOSIS — E041 Nontoxic single thyroid nodule: Secondary | ICD-10-CM | POA: Insufficient documentation

## 2021-02-25 MED ORDER — METHYLPREDNISOLONE 4 MG PO TBPK
ORAL_TABLET | ORAL | 0 refills | Status: DC
  Filled 2021-02-25: qty 21, 6d supply, fill #0

## 2021-03-02 ENCOUNTER — Encounter: Payer: Self-pay | Admitting: Interventional Cardiology

## 2021-03-02 ENCOUNTER — Ambulatory Visit (INDEPENDENT_AMBULATORY_CARE_PROVIDER_SITE_OTHER): Payer: No Typology Code available for payment source

## 2021-03-02 DIAGNOSIS — R001 Bradycardia, unspecified: Secondary | ICD-10-CM | POA: Diagnosis not present

## 2021-03-02 DIAGNOSIS — R5382 Chronic fatigue, unspecified: Secondary | ICD-10-CM

## 2021-03-03 ENCOUNTER — Telehealth: Payer: Self-pay | Admitting: *Deleted

## 2021-03-03 NOTE — Telephone Encounter (Signed)
Received faxed strip from [facility] ordered by Dr Scarlette Calico.   Dr Curt Bears reviewed and advised no changes, continue to monitor.

## 2021-03-04 ENCOUNTER — Telehealth: Payer: Self-pay | Admitting: *Deleted

## 2021-03-04 MED ORDER — BETAMETHASONE SOD PHOS & ACET 6 (3-3) MG/ML IJ SUSP: 12.0000 mg | Freq: Once | INTRAMUSCULAR | Status: DC

## 2021-03-04 MED ORDER — BETAMETHASONE SOD PHOS & ACET 6 (3-3) MG/ML IJ SUSP
3.0000 mg | Freq: Once | INTRAMUSCULAR | Status: DC
Start: 2021-03-04 — End: 2021-11-13

## 2021-03-04 NOTE — Progress Notes (Signed)
   HPI: 60 y.o. male presenting today as a new patient for evaluation of bilateral foot pain is been going on for approximately 2-3 months now.  He has constant pain to the lateral aspect of the foot.  He mentioned that has been going on since early February.  Patient likes to hike and wore a new pair of hiking boots which may have elicited the pain.  He presents for further treatment and evaluation.  He currently has not done anything for treatment  Past Medical History:  Diagnosis Date  . Arthritis   . Cancer [facility]    possible thyroid cancer, now s/p partial thyroidectomy   . Depression   . Hypertension    several years ago was on lisiopril but lost weight and no longer needs  . Hypothyroidism    mgd on levothyroxine   . Left-sided tinnitus   . Premature atrial contraction    was seen on EKG at his PCP approx 8 years ago, denies any symptoms, reports he was a runner before onset of hip pain in feb 2020     Physical Exam: General: The patient is alert and oriented x3 in no acute distress.  Dermatology: Skin is warm, dry and supple bilateral lower extremities. Negative for open lesions or macerations.  Vascular: Palpable pedal pulses bilaterally. No edema or erythema noted. Capillary refill within normal limits.  Neurological: Epicritic and protective threshold grossly intact bilaterally.   Musculoskeletal Exam: Range of motion within normal limits to all pedal and ankle joints bilateral. Muscle strength 5/5 in all groups bilateral.  Pain on palpation range of motion to the fifth MTPJ of the right foot.  Clinical evidence of tailor's bunion deformity bilateral  Radiographic Exam:  Normal osseous mineralization. Joint spaces preserved. No fracture/dislocation/boney destruction.  Increased intermetatarsal angle between the fourth and fifth metatarsals with lateral deviation of the fifth metatarsal head noted consistent with a tailor's bunionette deformity  Assessment: 1.  Tailor's  bunion bilateral 2.  Fifth MTPJ capsulitis right   Plan of Care:  1. Patient evaluated. X-Rays reviewed.  2.  Injection of 0.5 cc Celestone Soluspan injected into the fifth MTPJ right foot 3.  Prescription for Medrol Dosepak 4.  Recommend wide fitting shoes and hiking boots that do not irritate the tailor's bunion areas 5.  Return to clinic in 4 weeks      Edrick Kins, DPM [facility]  Dr. Edrick Kins, DPM    [address], [place] [address]                Office [phone]  Fax [phone]

## 2021-03-04 NOTE — Telephone Encounter (Signed)
Received faxed strip from [facility] - had Dr Sherren Mocha (DOD) review.  Sinus, PVCs and PACs - continue to monitor.

## 2021-03-14 ENCOUNTER — Other Ambulatory Visit (HOSPITAL_COMMUNITY): Payer: Self-pay

## 2021-03-14 ENCOUNTER — Emergency Department (HOSPITAL_BASED_OUTPATIENT_CLINIC_OR_DEPARTMENT_OTHER): Payer: No Typology Code available for payment source | Admitting: Radiology

## 2021-03-14 ENCOUNTER — Encounter (HOSPITAL_BASED_OUTPATIENT_CLINIC_OR_DEPARTMENT_OTHER): Payer: Self-pay | Admitting: *Deleted

## 2021-03-14 ENCOUNTER — Emergency Department (HOSPITAL_BASED_OUTPATIENT_CLINIC_OR_DEPARTMENT_OTHER)
Admission: EM | Admit: 2021-03-14 | Discharge: 2021-03-14 | Disposition: A | Discharge status: Home / Self Care | Payer: No Typology Code available for payment source | Attending: Emergency Medicine | Admitting: Emergency Medicine

## 2021-03-14 ENCOUNTER — Other Ambulatory Visit: Payer: Self-pay

## 2021-03-14 DIAGNOSIS — Z96641 Presence of right artificial hip joint: Secondary | ICD-10-CM | POA: Insufficient documentation

## 2021-03-14 DIAGNOSIS — M546 Pain in thoracic spine: Secondary | ICD-10-CM | POA: Diagnosis not present

## 2021-03-14 DIAGNOSIS — Z79899 Other long term (current) drug therapy: Secondary | ICD-10-CM | POA: Diagnosis not present

## 2021-03-14 DIAGNOSIS — E039 Hypothyroidism, unspecified: Secondary | ICD-10-CM | POA: Insufficient documentation

## 2021-03-14 DIAGNOSIS — I10 Essential (primary) hypertension: Secondary | ICD-10-CM | POA: Diagnosis not present

## 2021-03-14 DIAGNOSIS — M25511 Pain in right shoulder: Secondary | ICD-10-CM | POA: Insufficient documentation

## 2021-03-14 DIAGNOSIS — S2241XA Multiple fractures of ribs, right side, initial encounter for closed fracture: Secondary | ICD-10-CM | POA: Insufficient documentation

## 2021-03-14 DIAGNOSIS — Z87891 Personal history of nicotine dependence: Secondary | ICD-10-CM | POA: Insufficient documentation

## 2021-03-14 DIAGNOSIS — S299XXA Unspecified injury of thorax, initial encounter: Secondary | ICD-10-CM | POA: Diagnosis present

## 2021-03-14 DIAGNOSIS — Y9355 Activity, bike riding: Secondary | ICD-10-CM | POA: Insufficient documentation

## 2021-03-14 MED ORDER — OXYCODONE HCL 5 MG PO TABS
5.0000 mg | ORAL_TABLET | Freq: Four times a day (QID) | ORAL | 0 refills | Status: DC | PRN
  Filled 2021-03-14: qty 10, 3d supply, fill #0

## 2021-03-14 MED ORDER — CYCLOBENZAPRINE HCL 10 MG PO TABS
10.0000 mg | ORAL_TABLET | Freq: Two times a day (BID) | ORAL | 0 refills | Status: DC | PRN
  Filled 2021-03-14: qty 20, 10d supply, fill #0

## 2021-03-14 MED ORDER — ACETAMINOPHEN 325 MG PO TABS
650.0000 mg | ORAL_TABLET | Freq: Once | ORAL | Status: AC
  Administered 2021-03-14: 650 mg via ORAL
  Filled 2021-03-14: qty 2

## 2021-03-14 MED ORDER — IBUPROFEN 800 MG PO TABS
800.0000 mg | ORAL_TABLET | Freq: Once | ORAL | Status: AC
  Administered 2021-03-14: 800 mg via ORAL
  Filled 2021-03-14: qty 1

## 2021-03-14 NOTE — ED Notes (Signed)
Patient instructed on use of IS prior to D/C.
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
## 2021-03-14 NOTE — Discharge Instructions (Addendum)
Use incentive spirometry every several hours.  Recommend 600 mg ibuprofen every 8 hours for the next 5 days.  Stop taking your meloxicam if you decide to take ibuprofen.  Recommend 1000 mg of Tylenol every 6 hours for pain as well.  Recommend buying over-the-counter lidocaine patches.  Use Flexeril as well for pain.  This can be sedating so be careful with any driving or dangerous activities.  Roxicodone has also been prescribed to you for breakthrough pain.  This is a narcotic.  Also be careful with sedation with this medication as well.  Please return if you develop any fever or worsening pain.  Follow-up with your primary care doctor.

## 2021-03-14 NOTE — ED Notes (Signed)
Patient transported to CT 

## 2021-03-18 ENCOUNTER — Encounter: Payer: Self-pay | Admitting: Internal Medicine

## 2021-03-18 ENCOUNTER — Other Ambulatory Visit: Payer: Self-pay

## 2021-03-18 ENCOUNTER — Other Ambulatory Visit (HOSPITAL_COMMUNITY): Payer: Self-pay

## 2021-03-18 ENCOUNTER — Ambulatory Visit (INDEPENDENT_AMBULATORY_CARE_PROVIDER_SITE_OTHER): Payer: No Typology Code available for payment source | Admitting: Internal Medicine

## 2021-03-18 ENCOUNTER — Ambulatory Visit (INDEPENDENT_AMBULATORY_CARE_PROVIDER_SITE_OTHER): Payer: No Typology Code available for payment source

## 2021-03-18 VITALS — BP 122/70 | HR 51 | Temp 97.7°F | Ht 74.0 in | Wt 221.0 lb

## 2021-03-18 DIAGNOSIS — S2241XG Multiple fractures of ribs, right side, subsequent encounter for fracture with delayed healing: Secondary | ICD-10-CM

## 2021-03-18 DIAGNOSIS — J939 Pneumothorax, unspecified: Secondary | ICD-10-CM | POA: Diagnosis not present

## 2021-03-18 MED ORDER — TRAMADOL HCL 50 MG PO TABS
50.0000 mg | ORAL_TABLET | Freq: Four times a day (QID) | ORAL | 0 refills | Status: AC | PRN
  Filled 2021-03-18: qty 65, 17d supply, fill #0
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
## 2021-03-20 ENCOUNTER — Emergency Department (HOSPITAL_COMMUNITY): Payer: No Typology Code available for payment source

## 2021-03-20 ENCOUNTER — Encounter: Payer: Self-pay | Admitting: Internal Medicine

## 2021-03-20 ENCOUNTER — Encounter (HOSPITAL_COMMUNITY): Payer: Self-pay | Admitting: *Deleted

## 2021-03-20 ENCOUNTER — Ambulatory Visit (INDEPENDENT_AMBULATORY_CARE_PROVIDER_SITE_OTHER)
Admission: RE | Admit: 2021-03-20 | Discharge: 2021-03-20 | Disposition: A | Discharge status: Home / Self Care | Payer: No Typology Code available for payment source | Source: Ambulatory Visit | Attending: Internal Medicine | Admitting: Internal Medicine

## 2021-03-20 ENCOUNTER — Emergency Department (HOSPITAL_COMMUNITY)
Admission: EM | Admit: 2021-03-20 | Discharge: 2021-03-20 | Disposition: A | Discharge status: Home / Self Care | Payer: No Typology Code available for payment source | Attending: Emergency Medicine | Admitting: Emergency Medicine

## 2021-03-20 ENCOUNTER — Ambulatory Visit (INDEPENDENT_AMBULATORY_CARE_PROVIDER_SITE_OTHER): Payer: No Typology Code available for payment source

## 2021-03-20 ENCOUNTER — Other Ambulatory Visit: Payer: Self-pay

## 2021-03-20 ENCOUNTER — Ambulatory Visit (INDEPENDENT_AMBULATORY_CARE_PROVIDER_SITE_OTHER): Payer: No Typology Code available for payment source | Admitting: Internal Medicine

## 2021-03-20 VITALS — BP 142/86 | HR 78 | Temp 98.2°F | Ht 74.0 in | Wt 222.0 lb

## 2021-03-20 DIAGNOSIS — Z87891 Personal history of nicotine dependence: Secondary | ICD-10-CM | POA: Diagnosis not present

## 2021-03-20 DIAGNOSIS — Z20822 Contact with and (suspected) exposure to covid-19: Secondary | ICD-10-CM | POA: Diagnosis not present

## 2021-03-20 DIAGNOSIS — Z9889 Other specified postprocedural states: Secondary | ICD-10-CM

## 2021-03-20 DIAGNOSIS — E890 Postprocedural hypothyroidism: Secondary | ICD-10-CM | POA: Diagnosis not present

## 2021-03-20 DIAGNOSIS — S2241XG Multiple fractures of ribs, right side, subsequent encounter for fracture with delayed healing: Secondary | ICD-10-CM | POA: Diagnosis not present

## 2021-03-20 DIAGNOSIS — I491 Atrial premature depolarization: Secondary | ICD-10-CM | POA: Insufficient documentation

## 2021-03-20 DIAGNOSIS — R030 Elevated blood-pressure reading, without diagnosis of hypertension: Secondary | ICD-10-CM

## 2021-03-20 DIAGNOSIS — Z79899 Other long term (current) drug therapy: Secondary | ICD-10-CM | POA: Diagnosis not present

## 2021-03-20 DIAGNOSIS — Y9355 Activity, bike riding: Secondary | ICD-10-CM | POA: Diagnosis not present

## 2021-03-20 DIAGNOSIS — Z96641 Presence of right artificial hip joint: Secondary | ICD-10-CM | POA: Diagnosis not present

## 2021-03-20 DIAGNOSIS — S299XXA Unspecified injury of thorax, initial encounter: Secondary | ICD-10-CM | POA: Diagnosis present

## 2021-03-20 DIAGNOSIS — S270XXD Traumatic pneumothorax, subsequent encounter: Secondary | ICD-10-CM | POA: Diagnosis not present

## 2021-03-20 DIAGNOSIS — Z79890 Hormone replacement therapy (postmenopausal): Secondary | ICD-10-CM | POA: Diagnosis not present

## 2021-03-20 DIAGNOSIS — Z85850 Personal history of malignant neoplasm of thyroid: Secondary | ICD-10-CM | POA: Insufficient documentation

## 2021-03-20 DIAGNOSIS — I10 Essential (primary) hypertension: Secondary | ICD-10-CM | POA: Diagnosis not present

## 2021-03-20 DIAGNOSIS — J939 Pneumothorax, unspecified: Secondary | ICD-10-CM

## 2021-03-20 DIAGNOSIS — Z791 Long term (current) use of non-steroidal anti-inflammatories (NSAID): Secondary | ICD-10-CM | POA: Insufficient documentation

## 2021-03-20 DIAGNOSIS — J90 Pleural effusion, not elsewhere classified: Secondary | ICD-10-CM | POA: Insufficient documentation

## 2021-03-20 DIAGNOSIS — Y92410 Unspecified street and highway as the place of occurrence of the external cause: Secondary | ICD-10-CM | POA: Insufficient documentation

## 2021-03-20 HISTORY — PX: IR THORACENTESIS RIGHT ASP PLEURAL SPACE W/IMG GUIDE: IMG5380

## 2021-03-20 LAB — HEPATIC FUNCTION PANEL
ALT: 18 U/L (ref 0–53)
AST: 17 U/L (ref 0–37)
Albumin: 3.9 g/dL (ref 3.5–5.2)
Alkaline Phosphatase: 42 U/L (ref 39–117)
Bilirubin, Direct: 0.2 mg/dL (ref 0.0–0.3)
Total Bilirubin: 1.2 mg/dL (ref 0.2–1.2)
Total Protein: 7 g/dL (ref 6.0–8.3)

## 2021-03-20 LAB — CBC WITH DIFFERENTIAL/PLATELET
Basophils Absolute: 0.1 10*3/uL (ref 0.0–0.1)
Basophils Relative: 0.7 % (ref 0.0–3.0)
Eosinophils Absolute: 0.4 10*3/uL (ref 0.0–0.7)
Eosinophils Relative: 4.6 % (ref 0.0–5.0)
HCT: 44.3 % (ref 39.0–52.0)
Hemoglobin: 15.5 g/dL (ref 13.0–17.0)
Lymphocytes Relative: 17.1 % (ref 12.0–46.0)
Lymphs Abs: 1.5 10*3/uL (ref 0.7–4.0)
MCHC: 35 g/dL (ref 30.0–36.0)
MCV: 93.3 fl (ref 78.0–100.0)
Monocytes Absolute: 1 10*3/uL (ref 0.1–1.0)
Monocytes Relative: 11.5 % (ref 3.0–12.0)
Neutro Abs: 5.6 10*3/uL (ref 1.4–7.7)
Neutrophils Relative %: 66.1 % (ref 43.0–77.0)
Platelets: 270 10*3/uL (ref 150.0–400.0)
RBC: 4.74 Mil/uL (ref 4.22–5.81)
RDW: 12.7 % (ref 11.5–15.5)
WBC: 8.5 10*3/uL (ref 4.0–10.5)

## 2021-03-20 LAB — COMPREHENSIVE METABOLIC PANEL
ALT: 22 U/L (ref 0–44)
AST: 22 U/L (ref 15–41)
Albumin: 3.4 g/dL — ABNORMAL LOW (ref 3.5–5.0)
Alkaline Phosphatase: 41 U/L (ref 38–126)
Anion gap: 10 (ref 5–15)
BUN: 12 mg/dL (ref 6–20)
CO2: 26 mmol/L (ref 22–32)
Calcium: 8.8 mg/dL — ABNORMAL LOW (ref 8.9–10.3)
Chloride: 98 mmol/L (ref 98–111)
Creatinine, Ser: 0.82 mg/dL (ref 0.61–1.24)
GFR, Estimated: >60 mL/min (ref >60)
Glucose, Bld: 102 mg/dL — ABNORMAL HIGH (ref 70–99)
Potassium: 4.3 mmol/L (ref 3.5–5.1)
Sodium: 134 mmol/L — ABNORMAL LOW (ref 135–145)
Total Bilirubin: 1.5 mg/dL — ABNORMAL HIGH (ref 0.3–1.2)
Total Protein: 6.6 g/dL (ref 6.5–8.1)

## 2021-03-20 LAB — CBC
HCT: 45.5 % (ref 39.0–52.0)
Hemoglobin: 15.6 g/dL (ref 13.0–17.0)
MCH: 32.1 pg (ref 26.0–34.0)
MCHC: 34.3 g/dL (ref 30.0–36.0)
MCV: 93.6 fL (ref 80.0–100.0)
Platelets: 275 10*3/uL (ref 150–400)
RBC: 4.86 MIL/uL (ref 4.22–5.81)
RDW: 11.8 % (ref 11.5–15.5)
WBC: 8 10*3/uL (ref 4.0–10.5)
nRBC: 0 % (ref 0.0–0.2)

## 2021-03-20 LAB — BASIC METABOLIC PANEL
BUN: 13 mg/dL (ref 6–23)
CO2: 30 mEq/L (ref 19–32)
Calcium: 9.3 mg/dL (ref 8.4–10.5)
Chloride: 98 mEq/L (ref 96–112)
Creatinine, Ser: 0.81 mg/dL (ref 0.40–1.50)
GFR: 95.99 mL/min (ref >60.00)
Glucose, Bld: 96 mg/dL (ref 70–99)
Potassium: 4.2 mEq/L (ref 3.5–5.1)
Sodium: 135 mEq/L (ref 135–145)

## 2021-03-20 LAB — RESP PANEL BY RT-PCR (FLU A&B, COVID) ARPGX2
Influenza A by PCR: NEGATIVE
Influenza B by PCR: NEGATIVE
SARS Coronavirus 2 by RT PCR: NEGATIVE

## 2021-03-20 MED ORDER — IOHEXOL 300 MG/ML  SOLN
80.0000 mL | Freq: Once | INTRAMUSCULAR | Status: AC | PRN
  Administered 2021-03-20: 80 mL via INTRAVENOUS

## 2021-03-20 MED ORDER — LIDOCAINE HCL 1 % IJ SOLN
INTRAMUSCULAR | Status: AC | PRN
  Administered 2021-03-20: 10 mL

## 2021-03-20 MED ORDER — HYDROMORPHONE HCL 1 MG/ML IJ SOLN
1.0000 mg | Freq: Once | INTRAMUSCULAR | Status: AC
Start: 2021-03-20 — End: 2021-03-20
  Administered 2021-03-20: 1 mg via INTRAVENOUS
  Filled 2021-03-20: qty 1

## 2021-03-20 MED ORDER — SODIUM CHLORIDE 0.9 % IV BOLUS
1000.0000 mL | Freq: Once | INTRAVENOUS | Status: AC
  Administered 2021-03-20: 1000 mL via INTRAVENOUS

## 2021-03-20 MED ORDER — ONDANSETRON HCL 4 MG/2ML IJ SOLN
4.0000 mg | Freq: Once | INTRAMUSCULAR | Status: AC
  Administered 2021-03-20: 4 mg via INTRAVENOUS
  Filled 2021-03-20: qty 2

## 2021-03-20 NOTE — Procedures (Signed)
PROCEDURE SUMMARY:  Successful US guided right thoracentesis. Yielded 500 mL of amber fluid. Patient tolerated procedure well. No immediate complications. EBL = trace  Post procedure chest X-ray pending  Yarel Rushlow S Zamauri Nez PA-C 03/20/2021 4:13 PM
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
## 2021-03-20 NOTE — Discharge Instructions (Signed)
Rib Fracture  A rib fracture is a break or crack in one of the bones of the ribs. The ribs are like a cage that goes around your upper chest. A broken or cracked rib is often painful, but most do not cause other problems. Most rib fractures usually heal on their own in 1-3 months. What are the causes?  Doing movements over and over again with a lot of force, such as pitching a baseball or having a very bad cough.  A direct hit to the chest.  Cancer that has spread to the bones. What are the signs or symptoms?  Pain when you breathe in or cough.  Pain when someone presses on the injured area.  Feeling short of breath. How is this treated? Treatment depends on how bad the fracture is. In general:  Most rib fractures usually heal on their own in 1-3 months.  Healing may take longer if you have a cough or are doing activities that make the injury worse.  While you heal, you may be given medicines to control pain.  You will also be taught deep breathing exercises.  Very bad injuries may require a stay at the hospital or surgery. Follow these instructions at home: Managing pain, stiffness, and swelling  If told, put ice on the injured area. To do this: ? Put ice in a plastic bag. ? Place a towel between your skin and the bag. ? Leave the ice on for 20 minutes, 2-3 times a day. ? Take off the ice if your skin turns bright red. This is very important. If you cannot feel pain, heat, or cold, you have a greater risk of damage to the area.  Take over-the-counter and prescription medicines only as told by your doctor. Activity  Avoid activities that cause pain to the injured area. Protect your injured area.  Slowly increase activity as told by your doctor. General instructions  Do deep breathing exercises as told by your doctor. You may be told to: ? Take deep breaths many times a day. ? Cough several times a day while hugging a pillow. ? Use a device (incentive spirometer) to do  deep breathing many times a day.  Drink enough fluid to keep your pee (urine) clear or pale yellow.  Do not wear a rib belt or binder.  Keep all follow-up visits. Contact a doctor if:  You have a fever. Get help right away if:  You have trouble breathing.  You are short of breath.  You cannot stop coughing.  You cough up thick or bloody spit.  You feel like you may vomit (nauseous), vomit, or have belly (abdominal) pain.  Your pain gets worse and medicine does not help. These symptoms may be an emergency. Get help right away. Call your local emergency services (911 in the [place]  Do not wait to see if the symptoms will go away.  Do not drive yourself to the hospital. Summary  A rib fracture is a break or crack in one of the bones of the ribs.  Apply ice to the injured area and take medicines for pain as told by your doctor.  Take deep breaths and cough several times a day. Hug a pillow every time you cough. This information is not intended to replace advice given to you by your health care provider. Make sure you discuss any questions you have with your health care provider. Document Revised: 02/29/2020 Document Reviewed: 02/29/2020 Elsevier Patient Education  2021 [employer].  Thoracentesis  A thoracentesis is a procedure to remove excess fluid that has built up in the space between the linings of the chest wall and the lungs (pleural space). It is normal to have a small amount of fluid in the pleural space. Some medical conditions, such as heart failure, pneumonia, kidney problems, or cancer, can create too much fluid. This extra fluid is removed using a needle that is inserted through the skin and tissue and into the pleural space. A thoracentesis may be done to:  Understand why there is extra fluid in the pleural space and to create a treatment plan that is right for you.  Help get rid of shortness of breath, discomfort, or pain that is caused by the extra  fluid. Tell a health care provider about:  Any allergies you have.  All medicines you are taking, including vitamins, herbs, eye drops, creams, and over-the-counter medicines. This includes any use of steroids, either by mouth or as a cream.  Any problems you or family members have had with anesthetic medicines.  Any blood disorders you have, including any history of blood clots.  Any surgeries you have had.  Medical conditions you have, including a frequent cough or coughing episodes.  Whether you are pregnant or may be pregnant. What are the risks? Generally, this is a safe procedure. However, problems may occur, including:  Infection.  Bleeding.  Injury to the lung.  Injury to surrounding tissues or organs.  Collapse of the lung. What happens before the procedure?  Follow instructions from your health care provider about hydration, which may include: ? Up to 2 hours before the procedure - you may continue to drink clear liquids, such as water, clear fruit juice, black coffee, and plain tea. Eating and drinking restrictions Follow instructions from your health care provider about eating and drinking, which may include:  8 hours before the procedure - stop eating heavy meals or foods such as meat, fried foods, or fatty foods.  6 hours before the procedure - stop eating light meals or foods, such as toast or cereal.  6 hours before the procedure - stop drinking milk or drinks that contain milk.  2 hours before the procedure - stop drinking clear liquids. Medicines Ask your health care provider about:  Changing or stopping your regular medicines. This is especially important if you are taking diabetes medicines or blood thinners.  Taking medicines such as aspirin and ibuprofen. These medicines can thin your blood. Do not take these medicines unless your health care provider tells you to take them.  Taking over-the-counter medicines, vitamins, herbs, and  supplements.  Taking a cough suppressant if you have a frequent cough or coughing episodes. General instructions  You may have a chest X-ray or another imaging test, such as a CT scan or ultrasound, to determine the location and amount of fluid in your pleural space.  Plan to have someone take you home from the hospital or clinic.  Plan to have a responsible adult care for you for at least 24 hours after you leave the hospital or clinic. This is important.  Ask your health care provider what steps will be taken to help prevent infection. These may include: ? Removing hair at the needle-insertion site. ? Washing skin with a germ-killing soap. What happens during the procedure?  You will be asked to sit upright and lean slightly forward for the procedure.  An IV will be inserted into one of your veins.  You will be given  one or both of the following: ? A medicine to help you relax (sedative). ? A medicine to numb the area (local anesthetic).  The health care provider will insert a needle into your back so that it goes between the ribs and into the pleural space. You may feel pressure or slight pain as the needle is positioned into the pleural space.  Fluid will be removed from the pleural space through the needle. You may feel pressure as the fluid is removed.  The health care provider will take the needle out after the excess fluid has been removed. A sample of the fluid may be sent to the lab for testing.  The needle insertion site (puncture site) will be covered with a bandage (dressing). The procedure may vary among health care providers and hospitals. What happens after the procedure?  Your blood pressure, heart rate, breathing rate, and blood oxygen level will be monitored until you leave the hospital or clinic.  A chest X-ray may be done to check the amount of fluid that remains in your pleural space.  If a sample of fluid was sent for testing, ask your health care  provider, or the department that did the procedure, when your results will be ready. It is up to you to get your test results.  Do not drive for 24 hours if you were given a sedative during your procedure. Summary  A thoracentesis is a procedure to remove excess fluid that has built up in the space between the linings of the chest wall and the lungs (pleural space).  Some medical conditions, such as heart failure, pneumonia, kidney problems, or cancer, can create too much fluid.  For the procedure, a needle will be inserted between your ribs and into the pleural space. Fluid will be removed from the pleural space through the needle. The fluid may be sent to a lab for testing.  After the procedure, you may have a chest X-ray to check the amount of fluid still in your pleural space. This information is not intended to replace advice given to you by your health care provider. Make sure you discuss any questions you have with your health care provider. Document Revised: 07/10/2020 Document Reviewed: 07/10/2020 Elsevier Patient Education  2021 [employer].
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
## 2021-03-20 NOTE — ED Provider Notes (Signed)
Care transferred to me.  Patient's post procedure x-ray is unremarkable besides decreased fluid.  No pneumothorax.  He is feeling better though he is having some increased pain so I will give him 1 more dose of Dilaudid and he states he would feel well enough for discharge home to rest and continue his home meds.  Follow-up with PCP   Sherwood Gambler, MD 03/20/21 [phone]
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
## 2021-03-20 NOTE — ED Notes (Signed)
Incentive spirometer given and instruction given.

## 2021-03-20 NOTE — ED Notes (Signed)
Patient transported to IR 
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
## 2021-03-20 NOTE — ED Notes (Signed)
ED Provider at bedside. 

## 2021-03-23 ENCOUNTER — Ambulatory Visit (INDEPENDENT_AMBULATORY_CARE_PROVIDER_SITE_OTHER): Payer: No Typology Code available for payment source

## 2021-03-23 ENCOUNTER — Ambulatory Visit (INDEPENDENT_AMBULATORY_CARE_PROVIDER_SITE_OTHER)
Admission: RE | Admit: 2021-03-23 | Discharge: 2021-03-23 | Disposition: A | Discharge status: Home / Self Care | Payer: No Typology Code available for payment source | Source: Ambulatory Visit | Attending: Internal Medicine | Admitting: Internal Medicine

## 2021-03-23 ENCOUNTER — Other Ambulatory Visit: Payer: Self-pay

## 2021-03-23 ENCOUNTER — Ambulatory Visit (INDEPENDENT_AMBULATORY_CARE_PROVIDER_SITE_OTHER): Payer: No Typology Code available for payment source | Admitting: Internal Medicine

## 2021-03-23 ENCOUNTER — Encounter: Payer: Self-pay | Admitting: Internal Medicine

## 2021-03-23 VITALS — BP 128/68 | HR 50 | Temp 98.2°F | Resp 16

## 2021-03-23 DIAGNOSIS — R7989 Other specified abnormal findings of blood chemistry: Secondary | ICD-10-CM | POA: Insufficient documentation

## 2021-03-23 DIAGNOSIS — R0781 Pleurodynia: Secondary | ICD-10-CM | POA: Diagnosis not present

## 2021-03-23 DIAGNOSIS — S2241XG Multiple fractures of ribs, right side, subsequent encounter for fracture with delayed healing: Secondary | ICD-10-CM

## 2021-03-23 DIAGNOSIS — J939 Pneumothorax, unspecified: Secondary | ICD-10-CM | POA: Diagnosis not present

## 2021-03-23 LAB — CBC WITH DIFFERENTIAL/PLATELET
Basophils Absolute: 0.1 10*3/uL (ref 0.0–0.1)
Basophils Relative: 0.8 % (ref 0.0–3.0)
Eosinophils Absolute: 0.4 10*3/uL (ref 0.0–0.7)
Eosinophils Relative: 5.4 % — ABNORMAL HIGH (ref 0.0–5.0)
HCT: 44 % (ref 39.0–52.0)
Hemoglobin: 15.2 g/dL (ref 13.0–17.0)
Lymphocytes Relative: 20 % (ref 12.0–46.0)
Lymphs Abs: 1.6 10*3/uL (ref 0.7–4.0)
MCHC: 34.6 g/dL (ref 30.0–36.0)
MCV: 92.3 fl (ref 78.0–100.0)
Monocytes Absolute: 0.7 10*3/uL (ref 0.1–1.0)
Monocytes Relative: 9.1 % (ref 3.0–12.0)
Neutro Abs: 5.2 10*3/uL (ref 1.4–7.7)
Neutrophils Relative %: 64.7 % (ref 43.0–77.0)
Platelets: 321 10*3/uL (ref 150.0–400.0)
RBC: 4.77 Mil/uL (ref 4.22–5.81)
RDW: 12.4 % (ref 11.5–15.5)
WBC: 8 10*3/uL (ref 4.0–10.5)

## 2021-03-23 LAB — D-DIMER, QUANTITATIVE: D-Dimer, Quant: 4.86 mcg/mL FEU — ABNORMAL HIGH (ref <0.50)

## 2021-03-23 MED ORDER — IOHEXOL 350 MG/ML SOLN
80.0000 mL | Freq: Once | INTRAVENOUS | Status: AC | PRN
  Administered 2021-03-23: 80 mL via INTRAVENOUS
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
## 2021-03-27 ENCOUNTER — Telehealth: Payer: Self-pay | Admitting: Internal Medicine

## 2021-03-27 ENCOUNTER — Telehealth: Payer: Self-pay | Admitting: *Deleted

## 2021-03-27 NOTE — Telephone Encounter (Signed)
Strip reviewed by Dr Angelena Form (DOD).  Shows afib wit 7 beats VT.  Patient should see EP next week. I spoke with patient.  He was not having any symptoms at time of event.  Patient scheduled to see Dr Lovena Le on Mar 31, 2021 at 2:15.

## 2021-03-27 NOTE — Telephone Encounter (Signed)
Patients wife calling, states they need a referral to heart care because they called and asked him to make an appointment based off what his heart monitor is showing. They need a referral before they go because insurance will not cover without referral. Appt is 05.10.22

## 2021-03-27 NOTE — Telephone Encounter (Signed)
Monitor report received from [employer]. Strips from 5/5 at 8:09 PM (central time).  I placed call to patient and left message to call office

## 2021-03-28 ENCOUNTER — Other Ambulatory Visit: Payer: Self-pay | Admitting: Internal Medicine

## 2021-03-28 DIAGNOSIS — I491 Atrial premature depolarization: Secondary | ICD-10-CM

## 2021-03-28 DIAGNOSIS — R001 Bradycardia, unspecified: Secondary | ICD-10-CM

## 2021-03-30 ENCOUNTER — Other Ambulatory Visit: Payer: Self-pay

## 2021-03-30 ENCOUNTER — Ambulatory Visit (INDEPENDENT_AMBULATORY_CARE_PROVIDER_SITE_OTHER): Payer: No Typology Code available for payment source | Admitting: Podiatry

## 2021-03-30 ENCOUNTER — Institutional Professional Consult (permissible substitution): Payer: No Typology Code available for payment source | Admitting: Neurology

## 2021-03-30 DIAGNOSIS — M21621 Bunionette of right foot: Secondary | ICD-10-CM

## 2021-03-30 DIAGNOSIS — M21622 Bunionette of left foot: Secondary | ICD-10-CM | POA: Diagnosis not present

## 2021-03-30 DIAGNOSIS — M7751 Other enthesopathy of right foot: Secondary | ICD-10-CM

## 2021-03-31 ENCOUNTER — Ambulatory Visit: Payer: No Typology Code available for payment source | Admitting: Internal Medicine

## 2021-03-31 ENCOUNTER — Encounter: Payer: Self-pay | Admitting: Internal Medicine

## 2021-03-31 ENCOUNTER — Other Ambulatory Visit (HOSPITAL_COMMUNITY): Payer: Self-pay

## 2021-03-31 VITALS — BP 146/70 | HR 100 | Ht 74.0 in | Wt 222.0 lb

## 2021-03-31 DIAGNOSIS — R5383 Other fatigue: Secondary | ICD-10-CM

## 2021-03-31 DIAGNOSIS — R001 Bradycardia, unspecified: Secondary | ICD-10-CM | POA: Diagnosis not present

## 2021-03-31 MED ORDER — METOPROLOL SUCCINATE ER 25 MG PO TB24
25.0000 mg | ORAL_TABLET | Freq: Every day | ORAL | 3 refills | Status: DC
  Filled 2021-03-31: qty 90, 90d supply, fill #0
  Filled 2021-07-01: qty 90, 90d supply, fill #1
  Filled 2021-10-02: qty 90, 90d supply, fill #2
  Filled 2021-12-31: qty 90, 90d supply, fill #3

## 2021-03-31 NOTE — Patient Instructions (Addendum)
Medication Instructions:  Your physician has recommended you make the following change in your medication:   1.  START taking metoprolol succinate 25 mg-  Take one tablet by mouth daily  Labwork: None ordered.  Testing/Procedures: Your physician has requested that you have an echocardiogram. Echocardiography is a painless test that uses sound waves to create images of your heart. It provides your doctor with information about the size and shape of your heart and how well your heart's chambers and valves are working. This procedure takes approximately one hour. There are no restrictions for this procedure.  Please schedule for ECHO  Follow-Up:  Your physician wants you to follow-up with Cristopher Peru, MD based on results of tests.  Any Other Special Instructions Will Be Listed Below (If Applicable).  If you need a refill on your cardiac medications before your next appointment, please call your pharmacy.   Metoprolol Extended-Release Tablets What is this medicine? METOPROLOL (me TOE proe lole) is a beta blocker. It decreases the amount of work your heart has to do and helps your heart beat regularly. It treats high blood pressure and/or prevent chest pain (also called angina). It also treats heart failure. This medicine may be used for other purposes; ask your health care provider or pharmacist if you have questions. COMMON BRAND NAME(S): toprol, Toprol XL What should I tell my health care provider before I take this medicine? They need to know if you have any of these conditions:  diabetes  heart or vessel disease like slow heart rate, worsening heart failure, heart block, sick sinus syndrome or Raynaud's disease  kidney disease  liver disease  lung or breathing disease, like asthma or emphysema  pheochromocytoma  thyroid disease  an unusual or allergic reaction to metoprolol, other beta-blockers, medicines, foods, dyes, or preservatives  pregnant or trying to get  pregnant  breast-feeding How should I use this medicine? Take this drug by mouth. Take it as directed on the prescription label at the same time every day. Take it with food. You may cut the tablet in half if it is scored (has a line in the middle of it). This may help you swallow the tablet if the whole tablet is too big. Be sure to take both halves. Do not take just one-half of the tablet. Keep taking it unless your health care provider tells you to stop. Talk to your health care provider about the use of this drug in children. While it may be prescribed for children as young as 6 for selected conditions, precautions do apply. Overdosage: If you think you have taken too much of this medicine contact a poison control center or emergency room at once. NOTE: This medicine is only for you. Do not share this medicine with others. What if I miss a dose? If you miss a dose, take it as soon as you can. If it is almost time for your next dose, take only that dose. Do not take double or extra doses. What may interact with this medicine? This medicine may interact with the following medications:  certain medicines for blood pressure, heart disease, irregular heart beat  certain medicines for depression, like monoamine oxidase (MAO) inhibitors, fluoxetine, or paroxetine  clonidine  dobutamine  epinephrine  isoproterenol  reserpine This list may not describe all possible interactions. Give your health care provider a list of all the medicines, herbs, non-prescription drugs, or dietary supplements you use. Also tell them if you smoke, drink alcohol, or use illegal drugs. Some  items may interact with your medicine. What should I watch for while using this medicine? Visit your doctor or health care professional for regular check ups. Contact your doctor right away if your symptoms worsen. Check your blood pressure and pulse rate regularly. Ask your health care professional what your blood pressure and  pulse rate should be, and when you should contact them. You may get drowsy or dizzy. Do not drive, use machinery, or do anything that needs mental alertness until you know how this medicine affects you. Do not sit or stand up quickly, especially if you are an older patient. This reduces the risk of dizzy or fainting spells. Contact your doctor if these symptoms continue. Alcohol may interfere with the effect of this medicine. Avoid alcoholic drinks. This medicine may increase blood sugar. Ask your healthcare provider if changes in diet or medicines are needed if you have diabetes. What side effects may I notice from receiving this medicine? Side effects that you should report to your doctor or health care professional as soon as possible:  allergic reactions like skin rash, itching or hives  cold or numb hands or feet  depression  difficulty breathing  faint  fever with sore throat  irregular heartbeat, chest pain  rapid weight gain  signs and symptoms of high blood sugar such as being more thirsty or hungry or having to urinate more than normal. You may also feel very tired or have blurry vision.  swollen legs or ankles Side effects that usually do not require medical attention (report to your doctor or health care professional if they continue or are bothersome):  anxiety or nervousness  change in sex drive or performance  dry skin  headache  nightmares or trouble sleeping  short term memory loss  stomach upset or diarrhea This list may not describe all possible side effects. Call your doctor for medical advice about side effects. You may report side effects to FDA at 1-800-FDA-1088. Where should I keep my medicine? Keep out of the reach of children and pets. Store at room temperature between 20 and 25 degrees C (68 and 77 degrees F). Throw away any unused drug after the expiration date. NOTE: This sheet is a summary. It may not cover all possible information. If you  have questions about this medicine, talk to your doctor, pharmacist, or health care provider.  2021 [employer]vier/Gold Standard (2019-06-21 18:23:00)

## 2021-03-31 NOTE — Progress Notes (Signed)
HPI [name] is referred by Dr. Ronnald Ramp for evaluation of multiple arrhythmias. 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 He likes to ride and hike and feel and fractured multiple ribs and had a PTX. He has residual pain. He has c/o feeling worse in the past few weeks with less energy and his HR has been both high and low. He has minimal palpitations. He has worn a cardiac monitor demonstrating NSR, PAC's, NSVT and PAF. He denies syncope. He has had some worsening dyspnea. His wife who is with him thinks that he may have sleep apnea although this was previously not documented. He snores at night.  No Known Allergies   Current Outpatient Medications  Medication Sig Dispense Refill  . alfuzosin (UROXATRAL) 10 MG 24 hr tablet TAKE 1 TABLET BY MOUTH ONCE DAILY WITH BREAKFAST (Patient taking differently: Take 10 mg by mouth daily with breakfast.) 90 tablet 1  . cyclobenzaprine (FLEXERIL) 10 MG tablet Take 1 tablet (10 mg total) by mouth 2 (two) times daily as needed for muscle spasms. 20 tablet 0  . levothyroxine (SYNTHROID) 88 MCG tablet TAKE 1 TABLET BY MOUTH IN THE MORNING ON AN EMPTY STOMACH (Patient taking differently: Take 88 mcg by mouth daily before breakfast.) 90 tablet 1  . levothyroxine (SYNTHROID) 88 MCG tablet TAKE 1 TABLET BY MOUTH ONCE DAILY IN THE MORNING ON AN EMPTY STOMACH (Patient taking differently: Take by mouth daily before breakfast. ON AN EMPTY STOMACH) 90 tablet 1  . meloxicam (MOBIC) 15 MG tablet TAKE 1 TABLET BY MOUTH DAILY. (Patient taking differently: Take 15 mg by mouth daily.) 90 tablet 1  . Multiple Vitamin (MULTIVITAMIN WITH MINERALS) TABS tablet Take 1 tablet by mouth 3 (three) times daily.    . sertraline (ZOLOFT) 100 MG tablet TAKE 1 TABLET BY MOUTH EVERY NIGHT AT BEDTIME (Patient taking differently: Take 100 mg by mouth at bedtime.) 90 tablet 1  . traMADol (ULTRAM) 50 MG tablet Take 1 tablet (50 mg total) by mouth every 6 (six)  hours as needed for up to 19 days. 65 tablet 0   Current Facility-Administered Medications  Medication Dose Route Frequency Provider Last Rate Last Admin  . betamethasone acetate-betamethasone sodium phosphate (CELESTONE) injection 3 mg  3 mg Intra-articular Once Rickey Cochran, DPM         Past Medical History:  Diagnosis Date  . Arthritis   . Cancer [facility])    possible thyroid cancer, now s/p partial thyroidectomy   . Depression   . Hypertension    several years ago was on lisiopril but lost weight and no longer needs  . Hypothyroidism    mgd on levothyroxine   . Left-sided tinnitus   . Premature atrial contraction    was seen on EKG at his PCP approx 8 years ago, denies any symptoms, reports he was a runner before onset of hip pain in feb 2020    ROS:   All systems reviewed and negative except as noted in the HPI.   Past Surgical History:  Procedure Laterality Date  . COLONOSCOPY    . IR THORACENTESIS ASP PLEURAL SPACE W/IMG GUIDE  03/20/2021  . JOINT REPLACEMENT Right 04/13/2019   right total hip arthroplasty  . THYROID LOBECTOMY Right 05/09/2018  . THYROID LOBECTOMY Right 05/09/2018   Procedure: RIGHT THYROID LOBECTOMY;  Surgeon: Rickey Ok, MD;  Location: [place];  Service: General;  Laterality: Right;  . TOTAL HIP ARTHROPLASTY Right  04/13/2019   Procedure: RIGHT TOTAL HIP ARTHROPLASTY ANTERIOR APPROACH;  Surgeon: Rickey Rossetti, MD;  Location[facility];  Service: Orthopedics;  Laterality: Right;     History reviewed. No pertinent family history.   Social History   Socioeconomic History  . Marital status: Married    Spouse name: Not on file  . Number of children: Not on file  . Years of education: Not on file  . Highest education level: Not on file  Occupational History    Comment: Self employed  Tobacco Use  . Smoking status: Former Research scientist (life sciences)  . Smokeless tobacco: Never Used  . Tobacco comment: QUIT IN 2004  Vaping Use  . Vaping Use: Never used   Substance and Sexual Activity  . Alcohol use: Not Currently    Alcohol/week: 0.0 standard drinks    Comment: quit  . Drug use: Never  . Sexual activity: Not on file  Other Topics Concern  . Not on file  Social History Narrative   He and his wife are vegan and very active in hiking, backpacking, cycling.   Social Determinants of Health   Financial Resource Strain: Not on file  Food Insecurity: Not on file  Transportation Needs: Not on file  Physical Activity: Not on file  Stress: Not on file  Social Connections: Not on file  Intimate Partner Violence: Not on file     BP (!) 146/70   Pulse 100   Ht 6\' 2"  (1.88 m)   Wt 222 lb (100.7 kg)   SpO2 96%   BMI 28.50 kg/m   Physical Exam:  Well appearing NAD HEENT: Unremarkable Neck:  No JVD, no thyromegally Lymphatics:  No adenopathy Back:  No CVA tenderness Lungs:  Clear with no wheezes HEART:  Regular rate rhythm, no murmurs, no rubs, no clicks Abd:  soft, positive bowel sounds, no organomegally, no rebound, no guarding Ext:  2 plus pulses, no edema, no cyanosis, no clubbing Skin:  No rashes no nodules Neuro:  CN II through XII intact, motor grossly intact  EKG - nsr   Assess/Plan: 1. PAF - his CHADSVASC score is 1. He does not currently need systemic anti-coagulation.  2. NSVT - minimally symptomatic. We will check a 2D echo. Additonal testing to follow if needed based on the echo. If his EF is down an ischemic eval warranted. If not we will discuss sleep study and AA drug therapy. 3. Recent fall - he denies syncope. We will follow.  Carleene Overlie Brick Ketcher,MD

## 2021-04-09 ENCOUNTER — Other Ambulatory Visit: Payer: Self-pay | Admitting: Internal Medicine

## 2021-04-09 DIAGNOSIS — E041 Nontoxic single thyroid nodule: Secondary | ICD-10-CM

## 2021-04-09 DIAGNOSIS — E890 Postprocedural hypothyroidism: Secondary | ICD-10-CM

## 2021-04-09 DIAGNOSIS — Z85850 Personal history of malignant neoplasm of thyroid: Secondary | ICD-10-CM

## 2021-04-13 NOTE — Progress Notes (Signed)
   HPI: 60 y.o. male presenting today for follow-up evaluation and treatment regarding tailor's bunionette deformities bilateral as well as capsulitis to the fifth toes.  Patient states that he is doing much better.  He recently did sustain some rib fractures due to a trip and fall injury and he is currently taking Motrin and Tylenol for the pain.  Past Medical History:  Diagnosis Date  . Arthritis   . Cancer [facility])    possible thyroid cancer, now s/p partial thyroidectomy   . Depression   . Hypertension    several years ago was on lisiopril but lost weight and no longer needs  . Hypothyroidism    mgd on levothyroxine   . Left-sided tinnitus   . Premature atrial contraction    was seen on EKG at his PCP approx 8 years ago, denies any symptoms, reports he was a runner before onset of hip pain in feb 2020     Physical Exam: General: The patient is alert and oriented x3 in no acute distress.  Dermatology: Skin is warm, dry and supple bilateral lower extremities. Negative for open lesions or macerations.  Vascular: Palpable pedal pulses bilaterally. No edema or erythema noted. Capillary refill within normal limits.  Neurological: Epicritic and protective threshold grossly intact bilaterally.   Musculoskeletal Exam: Range of motion within normal limits to all pedal and ankle joints bilateral. Muscle strength 5/5 in all groups bilateral.  Pain on palpation range of motion to the fifth MTPJ of the right foot.  Clinical evidence of tailor's bunion deformity bilateral  Radiographic Exam taken last visit:  Normal osseous mineralization. Joint spaces preserved. No fracture/dislocation/boney destruction.  Increased intermetatarsal angle between the fourth and fifth metatarsals with lateral deviation of the fifth metatarsal head noted consistent with a tailor's bunionette deformity  Assessment: 1.  Tailor's bunion bilateral 2.  Fifth MTPJ capsulitis right   Plan of Care:  1. Patient  evaluated. X-Rays reviewed.  2.  Continue Motrin 800 mg plus Tylenol as per the physician that is managing his rib fractures 3.  Continue Altra shoes 4.  Return to clinic as needed, if the symptoms recur then we may need to discuss possible surgery in the wintertime  *Patient does landscape design      Edrick Kins, DPM [facility]  Dr. Edrick Kins, DPM    [address], [place] [address]                Office [phone]  Fax [phone]

## 2021-04-23 ENCOUNTER — Other Ambulatory Visit: Payer: Self-pay | Admitting: Internal Medicine

## 2021-04-23 ENCOUNTER — Ambulatory Visit
Admission: RE | Admit: 2021-04-23 | Discharge: 2021-04-23 | Disposition: A | Discharge status: Home / Self Care | Payer: No Typology Code available for payment source | Source: Ambulatory Visit | Attending: Internal Medicine | Admitting: Internal Medicine

## 2021-04-23 ENCOUNTER — Other Ambulatory Visit (HOSPITAL_COMMUNITY): Payer: Self-pay

## 2021-04-23 DIAGNOSIS — Z85850 Personal history of malignant neoplasm of thyroid: Secondary | ICD-10-CM

## 2021-04-23 DIAGNOSIS — F418 Other specified anxiety disorders: Secondary | ICD-10-CM

## 2021-04-23 DIAGNOSIS — E890 Postprocedural hypothyroidism: Secondary | ICD-10-CM

## 2021-04-23 DIAGNOSIS — E041 Nontoxic single thyroid nodule: Secondary | ICD-10-CM

## 2021-04-23 MED ORDER — SERTRALINE HCL 100 MG PO TABS
ORAL_TABLET | Freq: Every day | ORAL | 1 refills | Status: DC
  Filled 2021-04-23: qty 90, 90d supply, fill #0
  Filled 2021-07-29: qty 90, 90d supply, fill #1

## 2021-04-28 ENCOUNTER — Ambulatory Visit (HOSPITAL_COMMUNITY): Payer: No Typology Code available for payment source | Attending: Internal Medicine

## 2021-04-28 ENCOUNTER — Other Ambulatory Visit: Payer: Self-pay

## 2021-04-28 ENCOUNTER — Other Ambulatory Visit (HOSPITAL_COMMUNITY): Payer: Self-pay

## 2021-04-28 DIAGNOSIS — R5383 Other fatigue: Secondary | ICD-10-CM

## 2021-04-28 LAB — ECHOCARDIOGRAM COMPLETE
Area-P 1/2: 2.28 cm2
S' Lateral: 3.5 cm

## 2021-04-28 MED FILL — Alfuzosin HCl Tab ER 24HR 10 MG: ORAL | 90 days supply | Qty: 90 | Fill #0 | Status: AC

## 2021-04-29 ENCOUNTER — Other Ambulatory Visit (HOSPITAL_COMMUNITY): Payer: Self-pay

## 2021-05-05 ENCOUNTER — Other Ambulatory Visit: Payer: Self-pay

## 2021-05-09 LAB — COLOGUARD
Cologuard: NEGATIVE
Cologuard: NEGATIVE

## 2021-05-13 ENCOUNTER — Telehealth: Payer: Self-pay

## 2021-05-13 NOTE — Telephone Encounter (Signed)
Pt scheduled for nurse visit 05/18/21  Pt already has consult for sleep apnea.

## 2021-05-13 NOTE — Telephone Encounter (Signed)
-----   Message from Evans Lance, MD sent at 05/13/2021  8:23 AM EDT ----- Schedule sleep study. Repeat 12 lead ECG with a nurse visit after starting the beta blocker. We will decide about whether to add flecainide or not based on how he responded to the beta blocker.

## 2021-05-18 ENCOUNTER — Encounter: Payer: Self-pay | Admitting: Neurology

## 2021-05-18 ENCOUNTER — Ambulatory Visit (INDEPENDENT_AMBULATORY_CARE_PROVIDER_SITE_OTHER): Payer: No Typology Code available for payment source | Admitting: Neurology

## 2021-05-18 ENCOUNTER — Ambulatory Visit (INDEPENDENT_AMBULATORY_CARE_PROVIDER_SITE_OTHER): Payer: No Typology Code available for payment source

## 2021-05-18 ENCOUNTER — Other Ambulatory Visit: Payer: Self-pay

## 2021-05-18 VITALS — BP 118/83 | HR 63 | Ht 73.5 in | Wt 219.0 lb

## 2021-05-18 DIAGNOSIS — I480 Paroxysmal atrial fibrillation: Secondary | ICD-10-CM

## 2021-05-18 DIAGNOSIS — E663 Overweight: Secondary | ICD-10-CM

## 2021-05-18 DIAGNOSIS — G4719 Other hypersomnia: Secondary | ICD-10-CM

## 2021-05-18 DIAGNOSIS — I472 Ventricular tachycardia: Secondary | ICD-10-CM

## 2021-05-18 DIAGNOSIS — R0683 Snoring: Secondary | ICD-10-CM

## 2021-05-18 DIAGNOSIS — R0681 Apnea, not elsewhere classified: Secondary | ICD-10-CM | POA: Diagnosis not present

## 2021-05-18 DIAGNOSIS — Z820 Family history of epilepsy and other diseases of the nervous system: Secondary | ICD-10-CM

## 2021-05-18 DIAGNOSIS — R351 Nocturia: Secondary | ICD-10-CM

## 2021-05-18 DIAGNOSIS — Z8249 Family history of ischemic heart disease and other diseases of the circulatory system: Secondary | ICD-10-CM

## 2021-05-18 DIAGNOSIS — I4729 Other ventricular tachycardia: Secondary | ICD-10-CM

## 2021-05-18 NOTE — Patient Instructions (Signed)

## 2021-05-18 NOTE — Progress Notes (Signed)
Subjective:    Patient ID: Rickey Cochran is a 60 y.o. male.  HPI    [name], MD, PhD [facility] [address] [place], [place] [address]  Dear Dr. Ronnald Ramp,    I saw your patient, Rickey Cochran, upon your kind request in my sleep clinic today for initial consultation of his sleep disorder, in particular, concern for underlying obstructive sleep apnea.  The patient is unaccompanied today.  As you know, Rickey Cochran is a 60 year old right-handed gentleman with an underlying medical history of sinus bradycardia, PACs, osteoarthritis, thyroid cancer, hypothyroidism, hypertension, depression, anxiety, BPH, recent rib fractures in April 8657 with complication of hemothorax, overweight state, and recent diagnosis of paroxysmal A. fib, who reports snoring and excessive daytime somnolence, as well as witnessed apneas per wife's feedback.  I reviewed your office note from 02/17/2021.  His Epworth sleepiness score is 11 out of 24, fatigue severity score is 42 out of 63.  His wife is a Marine scientist and has encouraged him to get checked for sleep apnea.  His father has sleep apnea.  His father also has a pacemaker.  His mom has A. fib.  Patient reports not being able to stay awake for long in the evening, falls asleep quickly.  He goes to bed generally between 9 and 10 PM and rise time is generally around 6 AM.  He has nocturia about once or twice per average night but denies recurrent morning headaches.  He lives with his wife, he has a grown daughter and his stepson recently moved out.  They have 2 dogs in the household.  He typically does not watch TV in the bedroom.  He works in Scientist, clinical (histocompatibility and immunogenetics).  He has reduced his caffeine intake and limit himself to 2 cups of coffee in the mornings.  He does not drink alcohol and quit smoking in 2007.  His Past Medical History Is Significant For: Past Medical History:  Diagnosis Date   Arthritis    Cancer ([facility])    possible  thyroid cancer, now s/p partial thyroidectomy    Depression    Hypertension    several years ago was on lisiopril but lost weight and no longer needs   Hypothyroidism    mgd on levothyroxine    Left-sided tinnitus    Premature atrial contraction    was seen on EKG at his PCP approx 8 years ago, denies any symptoms, reports he was a runner before onset of hip pain in feb 2020    His Past Surgical History Is Significant For: Past Surgical History:  Procedure Laterality Date   COLONOSCOPY     IR THORACENTESIS ASP PLEURAL SPACE W/IMG GUIDE  03/20/2021   JOINT REPLACEMENT Right 04/13/2019   right total hip arthroplasty   THYROID LOBECTOMY Right 05/09/2018   THYROID LOBECTOMY Right 05/09/2018   Procedure: RIGHT THYROID LOBECTOMY;  Surgeon: Ralene Ok, MD;  Location: [place];  Service: General;  Laterality: Right;   TOTAL HIP ARTHROPLASTY Right 04/13/2019   Procedure: RIGHT TOTAL HIP ARTHROPLASTY ANTERIOR APPROACH;  Surgeon: Mcarthur Rossetti, MD;  [facility];  Service: Orthopedics;  Laterality: Right;    His Family History Is Significant For: History reviewed. No pertinent family history.  His Social History Is Significant For: Social History   Socioeconomic History   Marital status: Married    Spouse name: Not on file   Number of children: Not on file   Years of education: Not on file  Highest education level: Not on file  Occupational History    Comment: Self employed  Tobacco Use   Smoking status: Former    Pack years: 0.00   Smokeless tobacco: Never   Tobacco comments:    QUIT IN 2004  Vaping Use   Vaping Use: Never used  Substance and Sexual Activity   Alcohol use: Not Currently    Alcohol/week: 0.0 standard drinks    Comment: quit   Drug use: Never   Sexual activity: Not on file  Other Topics Concern   Not on file  Social History Narrative   He and his wife are vegan and very active in hiking, backpacking, cycling.   Social Determinants of  Health   Financial Resource Strain: Not on file  Food Insecurity: Not on file  Transportation Needs: Not on file  Physical Activity: Not on file  Stress: Not on file  Social Connections: Not on file    His Allergies Are:  No Known Allergies:   His Current Medications Are:  Outpatient Encounter Medications as of 05/18/2021  Medication Sig   alfuzosin (UROXATRAL) 10 MG 24 hr tablet TAKE 1 TABLET BY MOUTH ONCE DAILY WITH BREAKFAST (Patient taking differently: Take 10 mg by mouth daily with breakfast.)   levothyroxine (SYNTHROID) 88 MCG tablet TAKE 1 TABLET BY MOUTH IN THE MORNING ON AN EMPTY STOMACH (Patient taking differently: Take 88 mcg by mouth daily before breakfast.)   meloxicam (MOBIC) 15 MG tablet TAKE 1 TABLET BY MOUTH DAILY. (Patient taking differently: Take 15 mg by mouth daily.)   metoprolol succinate (TOPROL XL) 25 MG 24 hr tablet Take 1 tablet (25 mg total) by mouth daily.   Multiple Vitamin (MULTIVITAMIN WITH MINERALS) TABS tablet Take 1 tablet by mouth 3 (three) times daily.   sertraline (ZOLOFT) 100 MG tablet TAKE 1 TABLET BY MOUTH EVERY NIGHT AT BEDTIME   cyclobenzaprine (FLEXERIL) 10 MG tablet Take 1 tablet (10 mg total) by mouth 2 (two) times daily as needed for muscle spasms.   levothyroxine (SYNTHROID) 88 MCG tablet TAKE 1 TABLET BY MOUTH ONCE DAILY IN THE MORNING ON AN EMPTY STOMACH (Patient taking differently: Take by mouth daily before breakfast. ON AN EMPTY STOMACH)   Facility-Administered Encounter Medications as of 05/18/2021  Medication   betamethasone acetate-betamethasone sodium phosphate (CELESTONE) injection 3 mg  :   Review of Systems:  Out of a complete 14 point review of systems, all are reviewed and negative with the exception of these symptoms as listed below:  Review of Systems  Neurological:        Here for sleep consult.  No prior sleep study. Reports daytime fatigue is present along with snoring.  Pt also had cardiac work up with HR monitor  and a-fib events were noted.  Epworth Sleepiness Scale 0= would never doze 1= slight chance of dozing 2= moderate chance of dozing 3= high chance of dozing  Sitting and reading:3 Watching TV:3 Sitting inactive in a public place (ex. Theater or meeting):1 As a passenger in a car for an hour without a break:1 Lying down to rest in the afternoon:2 Sitting and talking to someone:0 Sitting quietly after lunch (no alcohol):1 In a car, while stopped in traffic:0 Total:11    Objective:  Neurological Exam  Physical Exam Physical Examination:   Vitals:   05/18/21 1321  BP: 118/83  Pulse: 63    General Examination: The patient is a very pleasant 60 y.o. male in no acute distress. He appears well-developed and  well-nourished and well groomed.   HEENT: Normocephalic, atraumatic, pupils are equal, round and reactive to light, extraocular tracking is good without limitation to gaze excursion or nystagmus noted. Hearing is grossly intact. Face is symmetric with normal facial animation. Speech is clear with no dysarthria noted. There is no hypophonia. There is no lip, neck/head, jaw or voice tremor. Neck is supple with full range of passive and active motion. There are no carotid bruits on auscultation. Oropharynx exam reveals: mild mouth dryness, good dental hygiene and moderate airway crowding, due to small airway entry, thicker soft palate, prominent uvula and thicker tongue.  Tongue protrudes centrally and palate elevates symmetrically.  Mallampati class II.  Tonsils on the smaller side.  He has a mild to moderate overbite.   Chest: Clear to auscultation without wheezing, rhonchi or crackles noted.  Heart: S1+S2+0, regular and normal without murmurs, rubs or gallops noted.   Abdomen: Soft, non-tender and non-distended with normal bowel sounds appreciated on auscultation.  Extremities: There is no pitting edema in the distal lower extremities bilaterally.   Skin: Warm and dry without  trophic changes noted.   Musculoskeletal: exam reveals no obvious joint deformities, tenderness or joint swelling or erythema.   Neurologically:  Mental status: The patient is awake, alert and oriented in all 4 spheres. His immediate and remote memory, attention, language skills and fund of knowledge are appropriate. There is no evidence of aphasia, agnosia, apraxia or anomia. Speech is clear with normal prosody and enunciation. Thought process is linear. Mood is normal and affect is normal.  Cranial nerves II - XII are as described above under HEENT exam.  Motor exam: Normal bulk, strength and tone is noted. There is no tremor, Romberg is negative. Fine motor skills and coordination: grossly intact.  Cerebellar testing: No dysmetria or intention tremor. There is no truncal or gait ataxia.  Sensory exam: intact to light touch in the upper and lower extremities.  Gait, station and balance: He stands easily. No veering to one side is noted. No leaning to one side is noted. Posture is age-appropriate and stance is narrow based. Gait shows normal stride length and normal pace. No problems turning are noted. Tandem walk is unremarkable.                Assessment and Plan:  In summary, Rickey Cochran is a very pleasant 60 y.o.-year old male with an underlying medical history of sinus bradycardia, PACs, osteoarthritis, thyroid cancer, hypothyroidism, hypertension, depression, anxiety, BPH, recent rib fractures in April 6147 with complication of hemothorax, overweight state, and recent diagnosis of paroxysmal A. fib, whose history and physical exam are concerning for obstructive sleep apnea (OSA). I had a long chat with the patient about my findings and the diagnosis of OSA, its prognosis and treatment options. We talked about medical treatments, surgical interventions and non-pharmacological approaches. I explained in particular the risks and ramifications of untreated moderate to severe OSA, especially  with respect to developing cardiovascular disease down the Road, including congestive heart failure, difficult to treat hypertension, cardiac arrhythmias, or stroke. Even type 2 diabetes has, in part, been linked to untreated OSA. Symptoms of untreated OSA include daytime sleepiness, memory problems, mood irritability and mood disorder such as depression and anxiety, lack of energy, as well as recurrent headaches, especially morning headaches. We talked about trying to maintain a healthy lifestyle in general, as well as the importance of weight control. We also talked about the importance of good sleep hygiene. I recommended  the following at this time: sleep study.   I explained the sleep test procedure to the patient and also outlined the difference between a laboratory attended sleep study versus home sleep test.  We talked about treatment options and particularly focused on the first-line treatment in the form of positive airway pressure or CPAP versus AutoPap and similar machines.   I explained the importance of being compliant with PAP treatment, not only for insurance purposes but primarily to improve His symptoms, and for the patient's long term health benefit, including to reduce His cardiovascular risks.  We will pick up our discussion after test results are in.  We will call him soon to schedule his testing.  I answered all his questions today and he was in agreement with the plan.  We will plan a follow-up after testing as well, depending on the test results. Thank you very much for allowing me to participate in the care of this nice patient. If I can be of any further assistance to you please do not hesitate to call me at [phone].  Sincerely,   [name], MD, PhD

## 2021-05-20 DIAGNOSIS — I4729 Other ventricular tachycardia: Secondary | ICD-10-CM | POA: Insufficient documentation

## 2021-05-20 DIAGNOSIS — I480 Paroxysmal atrial fibrillation: Secondary | ICD-10-CM | POA: Insufficient documentation

## 2021-05-20 NOTE — Progress Notes (Signed)
Reason for visit: NSVT, paro afib  Name of MD requesting visit: Dr. Lovena Le  H&P: Pt with NSVT and paro afib.  Echo WNL.    ROS related to problem: Pt states he feels fine.  Has not noticed a change on metoprolol succinate.  Assessment and plan per MD: Per Dr. Lovena Le- EKG NSR.  Per Dr. Lovena Le have Pt follow up with him after sleep study.  Will schedule follow up.

## 2021-05-26 ENCOUNTER — Other Ambulatory Visit (HOSPITAL_COMMUNITY): Payer: Self-pay

## 2021-05-26 MED FILL — Levothyroxine Sodium Tab 88 MCG: ORAL | 90 days supply | Qty: 90 | Fill #0 | Status: AC

## 2021-06-10 ENCOUNTER — Ambulatory Visit (INDEPENDENT_AMBULATORY_CARE_PROVIDER_SITE_OTHER): Payer: No Typology Code available for payment source | Admitting: Neurology

## 2021-06-10 DIAGNOSIS — I480 Paroxysmal atrial fibrillation: Secondary | ICD-10-CM

## 2021-06-10 DIAGNOSIS — Z8249 Family history of ischemic heart disease and other diseases of the circulatory system: Secondary | ICD-10-CM

## 2021-06-10 DIAGNOSIS — R0683 Snoring: Secondary | ICD-10-CM

## 2021-06-10 DIAGNOSIS — E663 Overweight: Secondary | ICD-10-CM

## 2021-06-10 DIAGNOSIS — G4733 Obstructive sleep apnea (adult) (pediatric): Secondary | ICD-10-CM | POA: Diagnosis not present

## 2021-06-10 DIAGNOSIS — R0681 Apnea, not elsewhere classified: Secondary | ICD-10-CM

## 2021-06-10 DIAGNOSIS — Z820 Family history of epilepsy and other diseases of the nervous system: Secondary | ICD-10-CM

## 2021-06-10 DIAGNOSIS — G4719 Other hypersomnia: Secondary | ICD-10-CM

## 2021-06-10 DIAGNOSIS — R351 Nocturia: Secondary | ICD-10-CM

## 2021-06-17 NOTE — Progress Notes (Signed)
See procedure note.

## 2021-06-21 NOTE — Addendum Note (Signed)
Addended by: [name] on: 06/21/2021 04:27 PM   Modules accepted: Orders

## 2021-06-21 NOTE — Procedures (Signed)
   [facility]  HOME SLEEP TEST (Watch PAT) REPORT  STUDY DATE: 06/10/2021  DOB: 07-23-1961  MRN: [id number]  ORDERING CLINICIAN: [name], MD, PhD   REFERRING CLINICIAN: Janith Lima, MD   CLINICAL INFORMATION/HISTORY: 60 year old right-handed gentleman with an underlying medical history of sinus bradycardia, PACs, osteoarthritis, thyroid cancer, hypothyroidism, hypertension, depression, anxiety, BPH, recent rib fractures in April [id number] with complication of hemothorax, overweight state, and recent diagnosis of paroxysmal A. fib, who reports snoring and excessive daytime somnolence, as well as witnessed apneas.  Epworth sleepiness score: 11/24.  BMI: 28.9 kg/m  FINDINGS:   Sleep Summary:   Total Recording Time (hours, min): 8 hours, 51 minutes  Total Sleep Time (hours, min):  7 hours, 52 minutes   Percent REM (%):    22.5%   Respiratory Indices:   Calculated pAHI (per hour):  33.2/hour         REM pAHI:    14.4/hour       NREM pAHI: 38.8/hour  Oxygen Saturation Statistics:    Oxygen Saturation (%) Mean: 95%   Minimum oxygen saturation (%):                 84%   O2 Saturation Range (%): 84-99%    O2 Saturation (minutes) <=88%: 4.5 min  Pulse Rate Statistics:   Pulse Mean (bpm):    49/min    Pulse Range (49-105/min)   IMPRESSION: OSA (obstructive sleep apnea)   RECOMMENDATION:  This home sleep test demonstrates severe obstructive sleep apnea with a total AHI of 33.2/hour and O2 nadir of 84%.  Snoring was detected and appeared to range from mild to loud.  Treatment with positive airway pressure is recommended. The patient will be advised to proceed with an autoPAP titration/trial at home for now. A full night titration study may be considered to optimize treatment settings, if needed down the road. Please note that untreated obstructive sleep apnea may carry additional perioperative morbidity. Patients with significant obstructive sleep  apnea should receive perioperative PAP therapy and the surgeons and particularly the anesthesiologist should be informed of the diagnosis and the severity of the sleep disordered breathing. The patient should be cautioned not to drive, work at heights, or operate dangerous or heavy equipment when tired or sleepy. Review and reiteration of good sleep hygiene measures should be pursued with any patient. Other causes of the patient's symptoms, including circadian rhythm disturbances, an underlying mood disorder, medication effect and/or an underlying medical problem cannot be ruled out based on this test. Clinical correlation is recommended. The patient and his referring provider will be notified of the test results. The patient will be seen in follow up in sleep clinic at [facility].  I certify that I have reviewed the raw data recording prior to the issuance of this report in accordance with the standards of the American Academy of Sleep Medicine (AASM).  INTERPRETING PHYSICIAN:   [name], MD, PhD  Board Certified in Neurology and Sleep Medicine  [facility] [address], [place], [place] [address] [phone]

## 2021-06-23 ENCOUNTER — Telehealth: Payer: Self-pay

## 2021-06-23 NOTE — Telephone Encounter (Signed)
-----   Message from [name], MD sent at 06/21/2021  4:27 PM EDT ----- Patient referred by Dr. Ronnald Ramp, seen by me on 05/18/2021, patient had a HST on 06/10/2021.    Please call and notify the patient that the recent home sleep test showed obstructive sleep apnea in the severe range. I recommend treatment in the form of autoPAP, which means, that we don't have to bring him in for a sleep study with CPAP, but will let him start using a so called autoPAP machine at home, which is a CPAP-like machine with self-adjusting pressures. We will send the order to a local DME company (of his choice, or as per insurance requirement). The DME representative will fit him with a mask, educate him on how to use the machine, how to put the mask on, etc. I have placed an order in the chart. Please send the order, talk to patient, send report to referring MD. We will need a FU in sleep clinic for 10 weeks post-PAP set up, please arrange that with me or one of our NPs. Also reinforce the need for compliance with treatment. Thanks,   [name], MD, PhD [facility])

## 2021-06-23 NOTE — Telephone Encounter (Signed)
I called pt. I advised pt that Dr. Rexene Alberts reviewed their sleep study results and found that pt severe osa. Dr. Rexene Alberts recommends that pt start an auto-pap machine. I reviewed PAP compliance expectations with the pt. Pt is agreeable to starting an auto-PAP. I advised pt that an order will be sent to a DME, [facility], and [facility] will call the pt within about one week after they file with the pt's insurance. [facility] will show the pt how to use the machine, fit for masks, and troubleshoot the auto-PAP if needed. Pt will call back once he has been set up on his auto-pap machine and the 31-90 f/u will be scheduled.A letter with all of this information in it will be mailed to the pt as a reminder. I verified with the pt that the address we have on file is correct. Pt verbalized understanding of results. Pt had no questions at this time but was encouraged to call back if questions arise. I have sent the order to [employer] and have received confirmation that they have received the order.

## 2021-06-29 ENCOUNTER — Ambulatory Visit (INDEPENDENT_AMBULATORY_CARE_PROVIDER_SITE_OTHER): Payer: No Typology Code available for payment source | Admitting: Internal Medicine

## 2021-06-29 ENCOUNTER — Other Ambulatory Visit: Payer: Self-pay

## 2021-06-29 ENCOUNTER — Encounter: Payer: Self-pay | Admitting: Internal Medicine

## 2021-06-29 ENCOUNTER — Ambulatory Visit (INDEPENDENT_AMBULATORY_CARE_PROVIDER_SITE_OTHER): Payer: No Typology Code available for payment source

## 2021-06-29 VITALS — BP 116/64 | HR 78 | Temp 98.1°F | Ht 74.0 in | Wt 209.0 lb

## 2021-06-29 DIAGNOSIS — M1712 Unilateral primary osteoarthritis, left knee: Secondary | ICD-10-CM | POA: Diagnosis not present

## 2021-06-29 DIAGNOSIS — R0781 Pleurodynia: Secondary | ICD-10-CM

## 2021-06-29 NOTE — Progress Notes (Signed)
Subjective:  Patient ID: Rickey Cochran, male    DOB: [birth year]  Age: 60 y.o. MRN: [id number]  CC: Office Visit (Left knee pain, lump behind left knee)  This visit occurred during the SARS-CoV-2 public health emergency.  Safety protocols were in place, including screening questions prior to the visit, additional usage of staff PPE, and extensive cleaning of exam room while observing appropriate contact time as indicated for disinfecting solutions.    HPI Rickey Cochran presents for f/up - He continues to complain of chest wall pain.  He gets some symptom relief with meloxicam.  He also complains of worsening left knee pain and for the last month he has noticed a painless swelling behind his left knee.  He denies any recent trauma or injury.  Outpatient Medications Prior to Visit  Medication Sig Dispense Refill   alfuzosin (UROXATRAL) 10 MG 24 hr tablet TAKE 1 TABLET BY MOUTH ONCE DAILY WITH BREAKFAST (Patient taking differently: Take 10 mg by mouth daily with breakfast.) 90 tablet 1   levothyroxine (SYNTHROID) 88 MCG tablet TAKE 1 TABLET BY MOUTH IN THE MORNING ON AN EMPTY STOMACH (Patient taking differently: Take 88 mcg by mouth daily before breakfast.) 90 tablet 1   meloxicam (MOBIC) 15 MG tablet TAKE 1 TABLET BY MOUTH DAILY. (Patient taking differently: Take 15 mg by mouth daily.) 90 tablet 1   metoprolol succinate (TOPROL XL) 25 MG 24 hr tablet Take 1 tablet (25 mg total) by mouth daily. 90 tablet 3   Multiple Vitamin (MULTIVITAMIN WITH MINERALS) TABS tablet Take 1 tablet by mouth 3 (three) times daily.     sertraline (ZOLOFT) 100 MG tablet TAKE 1 TABLET BY MOUTH EVERY NIGHT AT BEDTIME 90 tablet 1   cyclobenzaprine (FLEXERIL) 10 MG tablet Take 1 tablet (10 mg total) by mouth 2 (two) times daily as needed for muscle spasms. 20 tablet 0   levothyroxine (SYNTHROID) 88 MCG tablet TAKE 1 TABLET BY MOUTH ONCE DAILY IN THE MORNING ON AN EMPTY STOMACH (Patient taking differently: Take by  mouth daily before breakfast. ON AN EMPTY STOMACH) 90 tablet 1   Facility-Administered Medications Prior to Visit  Medication Dose Route Frequency Provider Last Rate Last Admin   betamethasone acetate-betamethasone sodium phosphate (CELESTONE) injection 3 mg  3 mg Intra-articular Once Daylene Katayama M, DPM        ROS Review of Systems  Constitutional:  Negative for diaphoresis and fatigue.  HENT: Negative.    Eyes: Negative.   Cardiovascular:  Positive for chest pain. Negative for palpitations and leg swelling.  Gastrointestinal:  Negative for abdominal pain, constipation, diarrhea, nausea and vomiting.  Endocrine: Negative.   Genitourinary: Negative.  Negative for difficulty urinating.  Musculoskeletal:  Positive for arthralgias. Negative for back pain.  Skin: Negative.   Neurological:  Negative for dizziness, weakness and headaches.  Hematological:  Negative for adenopathy. Does not bruise/bleed easily.  Psychiatric/Behavioral: Negative.     Objective:  BP 116/64 (BP Location: Left Arm, Patient Position: Sitting, Cuff Size: Large)   Pulse 78   Temp 98.1 F (36.7 C) (Oral)   Ht '6\' 2"'$  (1.88 m)   Wt 209 lb (94.8 kg)   SpO2 98%   BMI 26.83 kg/m   BP Readings from Last 3 Encounters:  06/29/21 116/64  05/18/21 118/83  03/31/21 (!) 146/70    Wt Readings from Last 3 Encounters:  06/29/21 209 lb (94.8 kg)  05/18/21 219 lb (99.3 kg)  03/31/21 222 lb (100.7 kg)  Physical Exam Vitals reviewed.  HENT:     Nose: Nose normal.     Mouth/Throat:     Mouth: Mucous membranes are moist.  Eyes:     Conjunctiva/sclera: Conjunctivae normal.  Cardiovascular:     Rate and Rhythm: Normal rate and regular rhythm.     Heart sounds: No murmur heard. Pulmonary:     Effort: Pulmonary effort is normal.     Breath sounds: No stridor. No wheezing, rhonchi or rales.  Abdominal:     General: Abdomen is flat. Bowel sounds are normal. There is no distension.     Palpations: Abdomen is soft.  There is no hepatomegaly, splenomegaly or mass.     Tenderness: There is no guarding.  Musculoskeletal:        General: Deformity present. No swelling. Normal range of motion.     Cervical back: Neck supple.     Right knee: Deformity (DJD) present. No swelling.     Left knee: Deformity (DJD) present. No swelling.     Right lower leg: No edema.     Left lower leg: No edema.     Comments: +DJD and soft cystic mass in left popliteal fossa  Skin:    General: Skin is warm and dry.  Neurological:     General: No focal deficit present.     Mental Status: He is alert.    Lab Results  Component Value Date   WBC 8.0 03/23/2021   HGB 15.2 03/23/2021   HCT 44.0 03/23/2021   PLT 321.0 03/23/2021   GLUCOSE 102 (H) 03/20/2021   CHOL 243 (H) 02/17/2021   TRIG 287.0 (H) 02/17/2021   HDL 48.00 02/17/2021   LDLDIRECT 151.0 02/17/2021   LDLCALC 136 (H) 01/29/2020   ALT 22 03/20/2021   AST 22 03/20/2021   NA 134 (L) 03/20/2021   K 4.3 03/20/2021   CL 98 03/20/2021   CREATININE 0.82 03/20/2021   BUN 12 03/20/2021   CO2 26 03/20/2021   TSH 1.52 02/17/2021   PSA 1.57 02/17/2021   HGBA1C 5.3 05/23/2019    US THYROID  Result Date: 04/23/2021 CLINICAL DATA:  Other. History of thyroid cancer, history of partial right thyroidectomy. Fine-needle aspiration biopsy was performed of residual thyroid tissue in the right resection bed in October of 2019. EXAM: THYROID ULTRASOUND TECHNIQUE: Ultrasound examination of the thyroid gland and adjacent soft tissues was performed. COMPARISON:  None. FINDINGS: Parenchymal Echotexture: Mildly heterogenous Isthmus: Surgically absent Right lobe: Surgically absent Left lobe: 5.3 x 2.0 x 1.9 cm _________________________________________________________ Estimated total number of nodules >/= 1 cm: 2 Number of spongiform nodules >/=  2 cm not described below (TR1): 0 Number of mixed cystic and solid nodules >/= 1.5 cm not described below ([place]): 0  _________________________________________________________ Surgical changes of prior right hemithyroidectomy. There is a small amount of residual non nodular thyroid tissue in the resection bed measuring 1.7 x 1.0 x 0.7 cm. This is unchanged compared to 1.8 x 0.9 x 0.8 cm previously. Nodule # 2: Prior biopsy: No Location: Left; Inferior Maximum size: 1.0 cm; Other 2 dimensions: 0.9 x 0.7 cm, previously, 1.0 x 1.1 x 0.7 cm Composition: solid/almost completely solid (2) Echogenicity: hypoechoic (2) Shape: not taller-than-wide (0) Margins: smooth (0) Echogenic foci: none (0) ACR TI-RADS total points: 4. ACR TI-RADS risk category:  TR4 (4-6 points). Significant change in size (>/= 20% in two dimensions and minimal increase of 2 mm): No Change in features: No Change in ACR TI-RADS risk category: No  ACR TI-RADS recommendations: *Given size (>/= 1 - 1.4 cm) and appearance, a follow-up ultrasound in 1 year should be considered based on TI-RADS criteria. _________________________________________________________ IMPRESSION: 1. Continued stability of small 1 cm TI-RADS category 4 nodule in the left inferior gland. The current examination confirms 3 years of stability. Recommend 1 additional ultrasound evaluation in 2 years to confirm 5 year stability. 2. Stable residual thyroid tissue in the right thyroid resection bed. This has been previously biopsied. The above is in keeping with the ACR TI-RADS recommendations - J Am Coll Radiol 2017;14:587-595. Electronically Signed   By: Jacqulynn Cadet M.D.   On: 04/23/2021 15:55   DG Chest 2 View  Result Date: 06/29/2021 CLINICAL DATA:  Pleurodynia. Right anterior and posterior chest pain for 4 months. Follow-up pneumothorax. EXAM: CHEST - 2 VIEW COMPARISON:  Chest radiographs and CTA 03/23/2021 FINDINGS: The cardiomediastinal silhouette is unchanged with normal heart size. The lungs are better inflated than on the prior study. No airspace consolidation or edema is evident  currently. The right-sided pleural effusion has resolved. No pneumothorax is identified. Multiple right-sided rib fractures are again noted with interval development of callus. IMPRESSION: 1. Improved lung aeration and interval resolution of right pleural effusion. No pneumothorax. 2. Healing right rib fractures. Electronically Signed   By: Logan Bores M.D.   On: 06/29/2021 14:52     Assessment & Plan:   Trestan was seen today for office visit.  Diagnoses and all orders for this visit:  Primary osteoarthritis of left knee- I recommended that he see orthopedics to consider surgical treatment options. 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  Will continue meloxicam. -     DG Chest 2 View; Future  I have discontinued Roshawn Barbin. Jaffer "Steve"'s cyclobenzaprine. I am also having him maintain his multivitamin with minerals, meloxicam, levothyroxine, alfuzosin, metoprolol succinate, and sertraline. We will continue to administer betamethasone acetate-betamethasone sodium phosphate.  No orders of the defined types were placed in this encounter.    Follow-up: No follow-ups on file.  Scarlette Calico, MD

## 2021-07-01 ENCOUNTER — Other Ambulatory Visit (HOSPITAL_COMMUNITY): Payer: Self-pay

## 2021-07-08 ENCOUNTER — Telehealth: Payer: Self-pay

## 2021-07-08 ENCOUNTER — Other Ambulatory Visit: Payer: Self-pay

## 2021-07-08 ENCOUNTER — Ambulatory Visit (INDEPENDENT_AMBULATORY_CARE_PROVIDER_SITE_OTHER): Payer: No Typology Code available for payment source

## 2021-07-08 ENCOUNTER — Ambulatory Visit: Payer: No Typology Code available for payment source | Admitting: Orthopedic Surgery

## 2021-07-08 DIAGNOSIS — M1712 Unilateral primary osteoarthritis, left knee: Secondary | ICD-10-CM | POA: Diagnosis not present

## 2021-07-08 DIAGNOSIS — G8929 Other chronic pain: Secondary | ICD-10-CM | POA: Diagnosis not present

## 2021-07-08 DIAGNOSIS — M25562 Pain in left knee: Secondary | ICD-10-CM | POA: Diagnosis not present

## 2021-07-08 NOTE — Telephone Encounter (Signed)
Can we please get auth for left knee gel injection?

## 2021-07-10 NOTE — Telephone Encounter (Signed)
Noted  

## 2021-07-16 ENCOUNTER — Encounter: Payer: Self-pay | Admitting: Orthopedic Surgery

## 2021-07-16 MED ORDER — BUPIVACAINE HCL 0.25 % IJ SOLN
4.0000 mL | INTRAMUSCULAR | Status: AC | PRN
  Administered 2021-07-08: 4 mL via INTRA_ARTICULAR

## 2021-07-16 MED ORDER — LIDOCAINE HCL 1 % IJ SOLN
5.0000 mL | INTRAMUSCULAR | Status: AC | PRN
Start: 2021-07-08 — End: 2021-07-08
  Administered 2021-07-08: 5 mL

## 2021-07-16 MED ORDER — METHYLPREDNISOLONE ACETATE 40 MG/ML IJ SUSP
40.0000 mg | INTRAMUSCULAR | Status: AC | PRN
  Administered 2021-07-08: 40 mg via INTRA_ARTICULAR

## 2021-07-16 NOTE — Progress Notes (Signed)
Office Visit Note   Patient: Rickey Cochran           Date of Birth: 1961/03/22           MRN: [id number] Visit Date: 07/08/2021 Requested by: Janith Lima, MD [address] [place],  [place] [id number] PCP: Janith Lima, MD  Subjective: Chief Complaint  Patient presents with   Left Knee - Pain    HPI: [name] is a 60 year old patient with left knee pain.  The pain has been ongoing since December 2021.  The pain comes and goes and gets worse with activity.  Reports swelling and stiffness in the knee.  He also feels a lump in the back of his knee.  He loves to do hiking and biking and outdoor sports.  Has some difficulty with squatting.  No prior knee surgery.  Does report some difficulty squatting.  Takes Mobic with some relief.              ROS: All systems reviewed are negative as they relate to the chief complaint within the history of present illness.  Patient denies  fevers or chills.   Assessment & Plan: Visit Diagnoses:  1. Chronic pain of left knee     Plan: Impression is left knee pain with evidence of moderate arthritis in the medial compartment.  He also has a Baker's cyst which is present.  Plan is to aspirate and inject the left knee today as well as aspirate the Baker's cyst under ultrasound guidance.  Sent to therapy for nonweightbearing quad strengthening exercises.  Plan on gel injection in about 2 months.  Follow-up at that time.  Follow-Up Instructions: Return in about 8 weeks (around 09/02/2021).   Orders:  Orders Placed This Encounter  Procedures   XR KNEE 3 VIEW LEFT   Ambulatory referral to Physical Therapy   No orders of the defined types were placed in this encounter.     Procedures: Large Joint Inj: L knee on 07/08/2021 7:10 AM Indications: diagnostic evaluation, joint swelling and pain Details: 18 G 1.5 in needle, superolateral approach  Arthrogram: No  Medications: 5 mL lidocaine 1 %; 40 mg methylPREDNISolone acetate 40 MG/ML; 4 mL  bupivacaine 0.25 % Outcome: tolerated well, no immediate complications Procedure, treatment alternatives, risks and benefits explained, specific risks discussed. Consent was given by the patient. Immediately prior to procedure a time out was called to verify the correct patient, procedure, equipment, support staff and site/side marked as required. Patient was prepped and draped in the usual sterile fashion.   Ultrasound guided aspiration of the left knee Baker's cyst also performed with aspiration of approximately 20 cc of fluid.  This patient is diagnosed with osteoarthritis of the knee(s).    Radiographs show evidence of joint space narrowing, osteophytes, subchondral sclerosis and/or subchondral cysts.  This patient has knee pain which interferes with functional and activities of daily living.    This patient has experienced inadequate response, adverse effects and/or intolerance with conservative treatments such as acetaminophen, NSAIDS, topical creams, physical therapy or regular exercise, knee bracing and/or weight loss.   This patient has experienced inadequate response or has a contraindication to intra articular steroid injections for at least 3 months.   This patient is not scheduled to have a total knee replacement within 6 months of starting treatment with viscosupplementation.    Clinical Data: No additional findings.  Objective: Vital Signs: There were no vitals taken for this visit.  Physical Exam:  Constitutional: Patient appears well-developed HEENT:  Head: Normocephalic Eyes:EOM are normal Neck: Normal range of motion Cardiovascular: Normal rate Pulmonary/chest: Effort normal Neurologic: Patient is alert Skin: Skin is warm Psychiatric: Patient has normal mood and affect   Ortho Exam: Ortho exam demonstrates good range of motion of the left knee with about a 3 degree flexion contracture and flexion to 120.  Collateral and cruciate ligaments are stable.  Mild  medial joint line tenderness is present.  Symmetric mild patellofemoral crepitus is present.  Extensor mechanism is intact.  No masses lymphadenopathy or skin changes noted in that left knee region.  Specialty Comments:  No specialty comments available.  Imaging: No results found.   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   Multiple fractures of ribs, right side, subsequent encounter for fracture with delayed healing 03/18/2021   Non-toxic multinodular goiter 02/25/2021   Nontoxic single thyroid nodule 02/25/2021   Personal history of malignant neoplasm of thyroid 02/25/2021   Hypothyroidism 02/25/2021   Thyroid cancer ([place]) 02/25/2021   Screen for colon cancer 02/20/2021   Chronic sinus bradycardia 02/17/2021   Primary osteoarthritis of both knees 02/17/2021   Primary osteoarthritis of both feet 02/17/2021   Primary osteoarthritis of left knee 09/23/2020   Routine general medical examination at health care facility 01/29/2020   Premature atrial contraction    BPH associated with nocturia 05/23/2019   Depression with anxiety 05/23/2019   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 No pertinent family history.  Past Surgical History:  Procedure Laterality Date   COLONOSCOPY     IR THORACENTESIS ASP  PLEURAL SPACE W/IMG GUIDE  03/20/2021   JOINT REPLACEMENT Right 04/13/2019   right total hip arthroplasty   THYROID LOBECTOMY Right 05/09/2018   THYROID LOBECTOMY Right 05/09/2018   Procedure: RIGHT THYROID LOBECTOMY;  Surgeon: Ralene Ok, MD;  Location: [facility];  Service: General;  Laterality: Right;   TOTAL HIP ARTHROPLASTY Right 04/13/2019   Procedure: RIGHT TOTAL HIP ARTHROPLASTY ANTERIOR APPROACH;  Surgeon: Mcarthur Rossetti, MD;  Location: [facility];  Service: Orthopedics;  Laterality: Right;   Social History   Occupational History    Comment: Self employed  Tobacco Use   Smoking status: Former   Smokeless tobacco: Never   Tobacco comments:    QUIT IN 2004  Vaping Use   Vaping Use: Never used  Substance and Sexual Activity   Alcohol use: Not Currently    Alcohol/week: 0.0 standard drinks    Comment: quit   Drug use: Never   Sexual activity: Not on file

## 2021-07-21 ENCOUNTER — Encounter: Payer: Self-pay | Admitting: Physical Therapy

## 2021-07-21 ENCOUNTER — Other Ambulatory Visit: Payer: Self-pay

## 2021-07-21 ENCOUNTER — Ambulatory Visit (INDEPENDENT_AMBULATORY_CARE_PROVIDER_SITE_OTHER): Payer: No Typology Code available for payment source | Admitting: Physical Therapy

## 2021-07-21 DIAGNOSIS — M6281 Muscle weakness (generalized): Secondary | ICD-10-CM

## 2021-07-21 DIAGNOSIS — M25562 Pain in left knee: Secondary | ICD-10-CM | POA: Diagnosis not present

## 2021-07-21 DIAGNOSIS — G8929 Other chronic pain: Secondary | ICD-10-CM

## 2021-07-21 DIAGNOSIS — R262 Difficulty in walking, not elsewhere classified: Secondary | ICD-10-CM

## 2021-07-21 DIAGNOSIS — M25662 Stiffness of left knee, not elsewhere classified: Secondary | ICD-10-CM

## 2021-07-21 NOTE — Patient Instructions (Signed)
Access Code: [email or web address] URL: [email or web address] Date: 07/21/2021 Prepared by: Daleen Bo  Exercises Supine Bridge with Resistance Band - 2 x daily - 4 x weekly - 3 sets - 10 reps Sidelying Hip Abduction and Extension with Loop Band - 2 x daily - 4 x weekly - 3 sets - 10 reps - 3 hold Seated Small Alternating Straight Leg Lifts - 1 x daily - 4 x weekly - 2 sets - 5 reps - 20 hold

## 2021-07-21 NOTE — Therapy (Signed)
[facility] [address] [place], [place], [address] Phone: [phone]   Fax:  [phone]  Physical Therapy Evaluation  Patient Details  Name: Rickey Cochran MRN: [id number] Date of Birth: [birth year] Referring Provider (PT): Marlou Sa Tonna Corner, MD   Encounter Date: 07/21/2021   PT End of Session - 07/21/21 1057     Visit Number 1    Number of Visits 12    Date for PT Re-Evaluation 10/19/21    Authorization Type [employer]    PT Start Time 1100    PT Stop Time [id number]    PT Time Calculation (min) 45 min    Activity Tolerance Patient tolerated treatment well    Behavior During Therapy [facility] for tasks assessed/performed             Past Medical History:  Diagnosis Date   Arthritis    Cancer ([place])    possible thyroid cancer, now s/p partial thyroidectomy    Depression    Hypertension    several years ago was on lisiopril but lost weight and no longer needs   Hypothyroidism    mgd on levothyroxine    Left-sided tinnitus    Premature atrial contraction    was seen on EKG at his PCP approx 8 years ago, denies any symptoms, reports he was a runner before onset of hip pain in feb 2020    Past Surgical History:  Procedure Laterality Date   COLONOSCOPY     IR THORACENTESIS ASP PLEURAL SPACE W/IMG GUIDE  03/20/2021   JOINT REPLACEMENT Right 04/13/2019   right total hip arthroplasty   THYROID LOBECTOMY Right 05/09/2018   THYROID LOBECTOMY Right 05/09/2018   Procedure: RIGHT THYROID LOBECTOMY;  Surgeon: Ralene Ok, MD;  Location: [place];  Service: General;  Laterality: Right;   TOTAL HIP ARTHROPLASTY Right 04/13/2019   Procedure: RIGHT TOTAL HIP ARTHROPLASTY ANTERIOR APPROACH;  Surgeon: Mcarthur Rossetti, MD;  Location: [facility];  Service: Orthopedics;  Laterality: Right;    There were no vitals filed for this visit.    Subjective Assessment - 07/21/21 1108     Subjective Pt states he has L knee pain. He recently  saw MD for Baker's cyst that was drained and injected. Pt notes pain along medial patella with pain. Pt notes it is not tender to touch. Pt descres the pain as a dull/nagging/aching pain. Pt states it feels like it will get swollen/tight/fatigued. Aggs: squatting, stairs, WB exercise, hiking/backpacking uphill (20lbs), cycling while pushing up hills; Eases: rest, ibuprofen. 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 Current 0/10, Worst 5/10. Pt notes popping clicking but denies locking or catching. Pt denies NT from the knee, but has history of back pain. Pt denies specific MOI with L knee. [place] and felt like the knee would give out. Pt denies cancer red flags.    Limitations Lifting;Walking    How long can you sit comfortably? N/A    How long can you stand comfortably? limited by back pain rather than knee pain    How long can you walk comfortably? >1 hr    Diagnostic tests AP lateral merchant radiographs left knee reviewed.  Moderate to severe   medial compartment arthritis is present with moderate arthritis noted in   the lateral compartment.  There is also some patellofemoral spurring.     Slight varus alignment.  No acute fracture.  Patella height normal   relative to distal femur.    Patient Stated Goals Return to hiking, cycling, backpacking without pain    Currently in Pain? No/denies                [facility] PT Assessment - 07/21/21 0001       Assessment   Medical Diagnosis M25.562,G89.29 (ICD-10-CM) - Chronic pain of left knee    Referring Provider (PT) Marlou Sa Tonna Corner, MD    Onset Date/Surgical Date 10/22/20      Precautions   Precautions None      Restrictions   Weight Bearing Restrictions Yes    RLE Weight Bearing --   NWB quad strengthening     Balance Screen   Has the patient fallen in the past 6 months Yes    How many times? 1   wreck while mountain biking in April   Has the patient had a  decrease in activity level because of a fear of falling?  No    Is the patient reluctant to leave their home because of a fear of falling?  No      Home Environment   Living Environment Private residence    Type of [place] Layout Multi-level      Prior Function   Level of Independence Independent      Cognition   Overall Cognitive Status Within Functional Limits for tasks assessed      Observation/Other Assessments   Focus on Therapeutic Outcomes (FOTO)  63 (67 at D/C)      Sensation   Light Touch Appears Intact      Functional Tests   Functional tests Sit to Stand;Squat      Squat   Comments dynamic valgus, above parallel depth,      Sit to Stand   Comments 5XSTS WNL      Posture/Postural Control   Posture/Postural Control Postural limitations    Postural Limitations Increased thoracic kyphosis    Posture Comments tibial varum in standing and SLS      ROM / Strength   AROM / PROM / Strength PROM;AROM;Strength      AROM   Overall AROM Comments WNL bilat      PROM   Overall PROM Comments WNL      Strength   Overall Strength Comments 5/5 at knees bilat with MMT, L difficulty with SL WB and step up, pain with ascending; L hip 4/5 ABD      Flexibility   Soft Tissue Assessment /Muscle Length yes    Hamstrings WFL    Quadriceps WFL      Palpation   Palpation comment hypertonicity of L quadriceps, banding; no joint line tenderness or around patellar tendon/pole      Special Tests    Special Tests Knee Special Tests    Knee Special tests  Step-up/Step Down Test;other      Step-up/Step Down    Findings Positive      other    Findings Negative    Comments McMurray, Lachman, ant drawer, varus/valgus      Ambulation/Gait   Ambulation Distance (Feet) 30 Feet    Gait Pattern Decreased dorsiflexion - left;Decreased dorsiflexion - right   stiff knee gait on L   Ambulation Surface Level    Stairs Yes    Stairs Assistance 7: Independent    Gait Comments  pain with ascending stairs, no pain with descent, decreased eccentric control  Objective measurements completed on examination: See above findings.       [place] Adult PT Treatment/Exercise - 07/21/21 0001       Exercises   Exercises Knee/Hip      Knee/Hip Exercises: Seated   Other Seated Knee/Hip Exercises seated SLR with hold 20s 4x slight ER      Knee/Hip Exercises: Supine   Bridges Limitations bridge with blue TB at knees 2x10      Knee/Hip Exercises: Sidelying   Other Sidelying Knee/Hip Exercises SLR ABD blue TB at knees for extension and ABD 2x10                    PT Education - 07/21/21 1212     Education Details MOI, diagnosis, prognosis, anatomy, exercise progression, DOMS expectations, muscle firing, HEP, POC    Person(s) Educated Patient    Methods Explanation;Demonstration;Tactile cues;Verbal cues;Handout    Comprehension Verbalized understanding;Returned demonstration;Verbal cues required;Tactile cues required              PT Short Term Goals - 07/21/21 1223       PT SHORT TERM GOAL #1   Title Pt will become independent with HEP in order to demonstrate synthesis of PT education.    Time 2    Period Weeks    Status New      PT SHORT TERM GOAL #2   Title Pt will be able to demonstrate step up without pain on L in order to demonstrate functional improvement in LE function for work and house hold duties.      PT SHORT TERM GOAL #3   Title Pt will be able to demonstrate below parallel BW squat without ant L knee pain in order to demonstrate functional improvement in L LE function for exercise and house hold duties.               PT Long Term Goals - 07/21/21 1225       PT LONG TERM GOAL #1   Title Pt  will become independent with final HEP in order to demonstrate synthesis of PT education.    Time 8    Period Weeks    Status New      PT LONG TERM GOAL #2   Title Pt will demonstrate >/= 67 on  FOTO measure in order to demonstrate a clinically significant change in LE function.    Time 8    Period Weeks    Status New      PT LONG TERM GOAL #3   Title Pt will be able to demonstrate ability to climb uphill on TM or stairs without pain in order to demonstrate functional improvement and tolerance to hiking simulated activity.    Time 8    Period Weeks    Status New                    Plan - 07/21/21 1058     Clinical Impression Statement Pt is a 60 y.o. male presenting to PT eval today for CC of L knee pain. Pt presents with decreased CKC L knee strength and L hip weakness. Pt does have slight lower quarter tibial varum alignment that might influence L medial knee pain. Pt's s/s appear consistent with history of medial knee OA. Pt's pain is minimally irritable and sensitive. Pt is more strength focused vs ROM. Pt's impairment limits their participation with ADL, exercise, and daily mobility. Pt would benefit from continued skilled therapy in order  to reach goals and maximize functional L LE strength and ROM for full return to PLOF.    Personal Factors and Comorbidities Comorbidity 1;Comorbidity 2;Age;Time since onset of injury/illness/exacerbation    Examination-Activity Limitations Locomotion Level;Transfers;Squat;Stairs;Stand;Other    Examination-Participation Restrictions Occupation;Community Activity;Other;Yard Work    Stability/Clinical Decision Making Stable/Uncomplicated    Designer, jewellery Low    Rehab Potential Good    PT Frequency 1x / week   1-2x   PT Duration 8 weeks    PT Treatment/Interventions ADLs/Self Care Home Management;Cryotherapy;Electrical Stimulation;Iontophoresis '4mg'$ /ml Dexamethasone;Moist Heat;Traction;Ultrasound;Gait training;Stair training;Functional mobility training;Therapeutic activities;Therapeutic exercise;Balance training;Neuromuscular re-education;Patient/family education;Orthotic Fit/Training;Manual techniques;Passive range of  motion;Dry needling;Taping;Vasopneumatic Device;Joint Manipulations;Spinal Manipulations    PT Next Visit Plan no CKC quad strength per referral, quad eccentrics, hip ABD strength, heel tap/step down    PT Home Exercise Plan Access Code [email or web address]    Consulted and Agree with Plan of Care Patient             Patient will benefit from skilled therapeutic intervention in order to improve the following deficits and impairments:  Abnormal gait, Decreased range of motion, Difficulty walking, Pain, Decreased balance, Decreased mobility, Decreased strength, Impaired flexibility, Hypomobility  Visit Diagnosis: Chronic pain of left knee  Muscle weakness (generalized)  Difficulty walking     Problem List Patient Active Problem List   Diagnosis Date Noted   NSVT (nonsustained ventricular tachycardia) (HCC) 05/20/2021   Paroxysmal atrial fibrillation ([place]) 05/20/2021   Pleurodynia 03/23/2021   Multiple fractures of ribs, right side, subsequent encounter for fracture with delayed healing 03/18/2021   Non-toxic multinodular goiter 02/25/2021   Nontoxic single thyroid nodule 02/25/2021   Personal history of malignant neoplasm of thyroid 02/25/2021   Hypothyroidism 02/25/2021   Thyroid cancer ([place]) 02/25/2021   Screen for colon cancer 02/20/2021   Chronic sinus bradycardia 02/17/2021   Primary osteoarthritis of both knees 02/17/2021   Primary osteoarthritis of both feet 02/17/2021   Primary osteoarthritis of left knee 09/23/2020   Routine general medical examination at health care facility 01/29/2020   Premature atrial contraction    BPH associated with nocturia 05/23/2019   Depression with anxiety 05/23/2019   Postoperative hypothyroidism 05/23/2019   Pes planus of both feet 09/11/2015    Daleen Bo PT, DPT 07/21/21 12:37 PM   [facility] [address] [place], [place], [address] Phone: [phone]   Fax:  [phone]  Name: Rickey Cochran MRN: [id number] Date of Birth: [birth year]

## 2021-07-24 ENCOUNTER — Other Ambulatory Visit: Payer: Self-pay | Admitting: Internal Medicine

## 2021-07-24 ENCOUNTER — Other Ambulatory Visit (HOSPITAL_COMMUNITY): Payer: Self-pay

## 2021-07-24 DIAGNOSIS — N401 Enlarged prostate with lower urinary tract symptoms: Secondary | ICD-10-CM

## 2021-07-24 DIAGNOSIS — R351 Nocturia: Secondary | ICD-10-CM

## 2021-07-24 MED ORDER — ALFUZOSIN HCL ER 10 MG PO TB24
ORAL_TABLET | Freq: Every day | ORAL | 1 refills | Status: DC
  Filled 2021-07-24: qty 90, 90d supply, fill #0
  Filled 2021-10-26: qty 90, 90d supply, fill #1

## 2021-07-28 ENCOUNTER — Telehealth: Payer: Self-pay | Admitting: Neurology

## 2021-07-28 NOTE — Telephone Encounter (Signed)
Pt was scheduled his Initial Cpap visit 10/14/21 Pt was informed to bring machine and power cord to appt. DME: [employer] [phone] F: [phone] Equipment Issued: Lewayne Bunting on 07/24/21 Pt is to be scheduled between 08/24/21-10/22/21

## 2021-07-29 ENCOUNTER — Other Ambulatory Visit (HOSPITAL_COMMUNITY): Payer: Self-pay

## 2021-08-04 ENCOUNTER — Encounter: Payer: No Typology Code available for payment source | Admitting: Physical Therapy

## 2021-08-11 ENCOUNTER — Ambulatory Visit (INDEPENDENT_AMBULATORY_CARE_PROVIDER_SITE_OTHER): Payer: No Typology Code available for payment source | Admitting: Physical Therapy

## 2021-08-11 ENCOUNTER — Other Ambulatory Visit: Payer: Self-pay

## 2021-08-11 ENCOUNTER — Telehealth: Payer: Self-pay

## 2021-08-11 ENCOUNTER — Encounter: Payer: Self-pay | Admitting: Physical Therapy

## 2021-08-11 DIAGNOSIS — M6281 Muscle weakness (generalized): Secondary | ICD-10-CM | POA: Diagnosis not present

## 2021-08-11 DIAGNOSIS — G8929 Other chronic pain: Secondary | ICD-10-CM

## 2021-08-11 DIAGNOSIS — R262 Difficulty in walking, not elsewhere classified: Secondary | ICD-10-CM | POA: Diagnosis not present

## 2021-08-11 DIAGNOSIS — M25562 Pain in left knee: Secondary | ICD-10-CM

## 2021-08-11 NOTE — Therapy (Signed)
[facility] [address] [place], [place], [address] Phone: [phone]   Fax:  [phone]  Physical Therapy Treatment  Patient Details  Name: Rickey Cochran MRN: [id number] Date of Birth: 1960-12-24 Referring Provider (PT): Meredith Pel, MD   Encounter Date: 08/11/2021   PT End of Session - 08/11/21 1241     Visit Number 2    Number of Visits 12    Date for PT Re-Evaluation 10/19/21    Authorization Type [employer]    PT Start Time 0017    PT Stop Time 1215    PT Time Calculation (min) 40 min    Activity Tolerance Patient tolerated treatment well    Behavior During Therapy [facility] for tasks assessed/performed             Past Medical History:  Diagnosis Date   Arthritis    Cancer ([place])    possible thyroid cancer, now s/p partial thyroidectomy    Depression    Hypertension    several years ago was on lisiopril but lost weight and no longer needs   Hypothyroidism    mgd on levothyroxine    Left-sided tinnitus    Premature atrial contraction    was seen on EKG at his PCP approx 8 years ago, denies any symptoms, reports he was a runner before onset of hip pain in feb 2020    Past Surgical History:  Procedure Laterality Date   COLONOSCOPY     IR THORACENTESIS ASP PLEURAL SPACE W/IMG GUIDE  03/20/2021   JOINT REPLACEMENT Right 04/13/2019   right total hip arthroplasty   THYROID LOBECTOMY Right 05/09/2018   THYROID LOBECTOMY Right 05/09/2018   Procedure: RIGHT THYROID LOBECTOMY;  Surgeon: Ralene Ok, MD;  Location: [facility];  Service: General;  Laterality: Right;   TOTAL HIP ARTHROPLASTY Right 04/13/2019   Procedure: RIGHT TOTAL HIP ARTHROPLASTY ANTERIOR APPROACH;  Surgeon: Mcarthur Rossetti, MD;  Location: [facility];  Service: Orthopedics;  Laterality: Right;    There were no vitals filed for this visit.   Subjective Assessment - 08/11/21 1135     Subjective went and did some hiking this past weekend -  knee was just a little sore and uncomfortable.  feels like cyst is back    Limitations Lifting;Walking    How long can you sit comfortably? N/A    How long can you stand comfortably? limited by back pain rather than knee pain    How long can you walk comfortably? >1 hr    Diagnostic tests AP lateral merchant radiographs left knee reviewed.  Moderate to severe   medial compartment arthritis is present with moderate arthritis noted in   the lateral compartment.  There is also some patellofemoral spurring.     Slight varus alignment.  No acute fracture.  Patella height normal   relative to distal femur.    Patient Stated Goals Return to hiking, cycling, backpacking without pain    Currently in Pain? No/denies                               [facility] Adult PT Treatment/Exercise - 08/11/21 1136       Self-Care   Self-Care Other Self-Care Comments    Other Self-Care Comments  educated on HEP and regular exercise including modifications, recovery days, and monitoring pain      Knee/Hip Exercises: Aerobic   Recumbent Bike L4 x 6 min  Knee/Hip Exercises: Machines for Strengthening   Cybex Knee Extension 25# LLE only 3x10      Knee/Hip Exercises: Seated   Long Arc Quad Left;3 sets;10 reps;Weights    Long Arc Quad Weight --   7.5#   Other Seated Knee/Hip Exercises seated SLR with 3# 3x10      Manual Therapy   Manual therapy comments STM with compression to Lt medial quad              Trigger Point Dry Needling - 08/11/21 1239     Consent Given? Yes    Education Handout Provided Yes    Muscles Treated Lower Quadrant Vastus medialis    Vastus medialis Response Twitch response elicited                     PT Short Term Goals - 08/11/21 1241       PT SHORT TERM GOAL #1   Title Pt will become independent with HEP in order to demonstrate synthesis of PT education.    Time 2    Period Weeks    Status Achieved      PT SHORT TERM GOAL #2   Title Pt  will be able to demonstrate step up without pain on L in order to demonstrate functional improvement in LE function for work and house hold duties.    Status On-going      PT SHORT TERM GOAL #3   Title Pt will be able to demonstrate below parallel BW squat without ant L knee pain in order to demonstrate functional improvement in L LE function for exercise and house hold duties.    Status On-going               PT Long Term Goals - 07/21/21 1225       PT LONG TERM GOAL #1   Title Pt  will become independent with final HEP in order to demonstrate synthesis of PT education.    Time 8    Period Weeks    Status New      PT LONG TERM GOAL #2   Title Pt will demonstrate >/= 67 on FOTO measure in order to demonstrate a clinically significant change in LE function.    Time 8    Period Weeks    Status New      PT LONG TERM GOAL #3   Title Pt will be able to demonstrate ability to climb uphill on TM or stairs without pain in order to demonstrate functional improvement and tolerance to hiking simulated activity.    Time 8    Period Weeks    Status New                   Plan - 08/11/21 1242     Clinical Impression Statement Pt tolerated session well today with focus on review of HEP today as well as trial of DN as he has a trigger point in medial quad.  Will benefit from PT to maximize function.    Personal Factors and Comorbidities Comorbidity 1;Comorbidity 2;Age;Time since onset of injury/illness/exacerbation    Examination-Activity Limitations Locomotion Level;Transfers;Squat;Stairs;Stand;Other    Examination-Participation Restrictions Occupation;Community Activity;Other;Yard Work    Stability/Clinical Decision Making Stable/Uncomplicated    Rehab Potential Good    PT Frequency 1x / week   1-2x   PT Duration 8 weeks    PT Treatment/Interventions ADLs/Self Care Home Management;Cryotherapy;Electrical Stimulation;Iontophoresis 4mg /ml Dexamethasone;Moist  Heat;Traction;Ultrasound;Gait training;Stair training;Functional mobility training;Therapeutic activities;Therapeutic exercise;Balance  training;Neuromuscular re-education;Patient/family education;Orthotic Fit/Training;Manual techniques;Passive range of motion;Dry needling;Taping;Vasopneumatic Device;Joint Manipulations;Spinal Manipulations    PT Next Visit Plan no CKC quad strength per referral, quad eccentrics, hip ABD strength, heel tap/step down    PT Home Exercise Plan Access Code [email or web address]    Consulted and Agree with Plan of Care Patient             Patient will benefit from skilled therapeutic intervention in order to improve the following deficits and impairments:  Abnormal gait, Decreased range of motion, Difficulty walking, Pain, Decreased balance, Decreased mobility, Decreased strength, Impaired flexibility, Hypomobility  Visit Diagnosis: Chronic pain of left knee  Muscle weakness (generalized)  Difficulty walking     Problem List Patient Active Problem List   Diagnosis Date Noted   NSVT (nonsustained ventricular tachycardia) (HCC) 05/20/2021   Paroxysmal atrial fibrillation ([place]) 05/20/2021   Pleurodynia 03/23/2021   Multiple fractures of ribs, right side, subsequent encounter for fracture with delayed healing 03/18/2021   Non-toxic multinodular goiter 02/25/2021   Nontoxic single thyroid nodule 02/25/2021   Personal history of malignant neoplasm of thyroid 02/25/2021   Hypothyroidism 02/25/2021   Thyroid cancer ([place]) 02/25/2021   Screen for colon cancer 02/20/2021   Chronic sinus bradycardia 02/17/2021   Primary osteoarthritis of both knees 02/17/2021   Primary osteoarthritis of both feet 02/17/2021   Primary osteoarthritis of left knee 09/23/2020   Routine general medical examination at health care facility 01/29/2020   Premature atrial contraction    BPH associated with nocturia 05/23/2019   Depression with anxiety 05/23/2019   Postoperative hypothyroidism  05/23/2019   Pes planus of both feet 09/11/2015        Laureen Abrahams, PT, DPT 08/11/21 12:44 PM     [facility] [address] [place], [place], [address] Phone: [phone]   Fax:  [phone]  Name: EHAB HUMBER MRN: [id number] Date of Birth: 05-22-1961

## 2021-08-11 NOTE — Telephone Encounter (Signed)
VOB submitted for Monovisc, left knee. Pending BV.

## 2021-08-18 ENCOUNTER — Other Ambulatory Visit: Payer: Self-pay

## 2021-08-18 ENCOUNTER — Encounter: Payer: Self-pay | Admitting: Physical Therapy

## 2021-08-18 ENCOUNTER — Ambulatory Visit (INDEPENDENT_AMBULATORY_CARE_PROVIDER_SITE_OTHER): Payer: No Typology Code available for payment source | Admitting: Physical Therapy

## 2021-08-18 DIAGNOSIS — M25562 Pain in left knee: Secondary | ICD-10-CM | POA: Diagnosis not present

## 2021-08-18 DIAGNOSIS — R262 Difficulty in walking, not elsewhere classified: Secondary | ICD-10-CM | POA: Diagnosis not present

## 2021-08-18 DIAGNOSIS — G8929 Other chronic pain: Secondary | ICD-10-CM

## 2021-08-18 DIAGNOSIS — M6281 Muscle weakness (generalized): Secondary | ICD-10-CM | POA: Diagnosis not present

## 2021-08-18 NOTE — Patient Instructions (Signed)
Access Code: [email or web address] URL: [email or web address] Date: 08/18/2021 Prepared by: Faustino Congress  Exercises Supine Bridge with Resistance Band - 2 x daily - 4 x weekly - 3 sets - 10 reps Sidelying Hip Abduction and Extension with Loop Band - 2 x daily - 4 x weekly - 3 sets - 10 reps - 3 hold Seated Small Alternating Straight Leg Lifts - 2 x daily - 4 x weekly - 3 sets - 5 reps - 20 hold Seated Long Arc Quad with Ankle Weight - 1-2 x daily - 7 x weekly - 3 sets - 10 reps Single Leg Knee Extension with Weight Machine - 1-2 x daily - 7 x weekly - 3 sets - 10 reps Full Leg Press - 1 x daily - 7 x weekly - 3 sets - 10 reps Single Leg Press - 1 x daily - 7 x weekly - 3 sets - 10 reps Kettlebell Deadlift - 1 x daily - 7 x weekly - 3 sets - 10 reps Side Step Down with Counter Support - 1 x daily - 7 x weekly - 3 sets - 10 reps

## 2021-08-18 NOTE — Therapy (Signed)
[facility] [address] [place], [place], [address] Phone: [phone]   Fax:  [phone]  Physical Therapy Treatment  Patient Details  Name: Rickey Cochran MRN: [id number] Date of Birth: [birth year] Referring Provider (PT): Meredith Pel, MD   Encounter Date: 08/18/2021   PT End of Session - 08/18/21 1219     Visit Number 3    Number of Visits 12    Date for PT Re-Evaluation 10/19/21    Authorization Type [employer]    PT Start Time 6720    PT Stop Time 1215    PT Time Calculation (min) 34 min    Activity Tolerance Patient tolerated treatment well    Behavior During Therapy [facility] for tasks assessed/performed             Past Medical History:  Diagnosis Date   Arthritis    Cancer ([place])    possible thyroid cancer, now s/p partial thyroidectomy    Depression    Hypertension    several years ago was on lisiopril but lost weight and no longer needs   Hypothyroidism    mgd on levothyroxine    Left-sided tinnitus    Premature atrial contraction    was seen on EKG at his PCP approx 8 years ago, denies any symptoms, reports he was a runner before onset of hip pain in feb 2020    Past Surgical History:  Procedure Laterality Date   COLONOSCOPY     IR THORACENTESIS ASP PLEURAL SPACE W/IMG GUIDE  03/20/2021   JOINT REPLACEMENT Right 04/13/2019   right total hip arthroplasty   THYROID LOBECTOMY Right 05/09/2018   THYROID LOBECTOMY Right 05/09/2018   Procedure: RIGHT THYROID LOBECTOMY;  Surgeon: Ralene Ok, MD;  Location: [place];  Service: General;  Laterality: Right;   TOTAL HIP ARTHROPLASTY Right 04/13/2019   Procedure: RIGHT TOTAL HIP ARTHROPLASTY ANTERIOR APPROACH;  Surgeon: Mcarthur Rossetti, MD;  Location: [facility];  Service: Orthopedics;  Laterality: Right;    There were no vitals filed for this visit.   Subjective Assessment - 08/18/21 1138     Subjective knee is "a little better."  still feels it     Limitations Lifting;Walking    How long can you sit comfortably? N/A    How long can you stand comfortably? limited by back pain rather than knee pain    How long can you walk comfortably? >1 hr    Diagnostic tests AP lateral merchant radiographs left knee reviewed.  Moderate to severe   medial compartment arthritis is present with moderate arthritis noted in   the lateral compartment.  There is also some patellofemoral spurring.     Slight varus alignment.  No acute fracture.  Patella height normal   relative to distal femur.    Patient Stated Goals Return to hiking, cycling, backpacking without pain    Currently in Pain? Yes    Pain Score 1     Pain Location Knee    Pain Orientation Left    Pain Descriptors / Indicators Aching    Pain Type Chronic pain    Pain Onset More than a month ago    Pain Frequency Intermittent    Aggravating Factors  standing, walking    Pain Relieving Factors rest                               [facility] Adult PT Treatment/Exercise - 08/18/21  1142       Knee/Hip Exercises: Aerobic   Recumbent Bike L4 x 6 min      Knee/Hip Exercises: Machines for Strengthening   Cybex Knee Extension 25# LLE only 3x10    Total Gym Leg Press bil push 168# 3x10; RLE 100# 3x10      Knee/Hip Exercises: Standing   Other Standing Knee Exercises RDL 20# KB 3x10    Other Standing Knee Exercises LLE on 4" step with RLE heel tap to floor 3x10      Knee/Hip Exercises: Seated   Other Seated Knee/Hip Exercises seated SLR with 3# 3x10 - cues to decr extensor lag                       PT Short Term Goals - 08/11/21 1241       PT SHORT TERM GOAL #1   Title Pt will become independent with HEP in order to demonstrate synthesis of PT education.    Time 2    Period Weeks    Status Achieved      PT SHORT TERM GOAL #2   Title Pt will be able to demonstrate step up without pain on L in order to demonstrate functional improvement in LE function for work  and house hold duties.    Status On-going      PT SHORT TERM GOAL #3   Title Pt will be able to demonstrate below parallel BW squat without ant L knee pain in order to demonstrate functional improvement in L LE function for exercise and house hold duties.    Status On-going               PT Long Term Goals - 07/21/21 1225       PT LONG TERM GOAL #1   Title Pt  will become independent with final HEP in order to demonstrate synthesis of PT education.    Time 8    Period Weeks    Status New      PT LONG TERM GOAL #2   Title Pt will demonstrate >/= 67 on FOTO measure in order to demonstrate a clinically significant change in LE function.    Time 8    Period Weeks    Status New      PT LONG TERM GOAL #3   Title Pt will be able to demonstrate ability to climb uphill on TM or stairs without pain in order to demonstrate functional improvement and tolerance to hiking simulated activity.    Time 8    Period Weeks    Status New                   Plan - 08/18/21 1220     Clinical Impression Statement Pt reported he felt DN was beneficial last session, but deferred today and will reassess next visit.  Tolerated session well today without increase in pain.  Will continue to benefit from PT to maximize function.    Personal Factors and Comorbidities Comorbidity 1;Comorbidity 2;Age;Time since onset of injury/illness/exacerbation    Examination-Activity Limitations Locomotion Level;Transfers;Squat;Stairs;Stand;Other    Examination-Participation Restrictions Occupation;Community Activity;Other;Yard Work    Stability/Clinical Decision Making Stable/Uncomplicated    Rehab Potential Good    PT Frequency 1x / week   1-2x   PT Duration 8 weeks    PT Treatment/Interventions ADLs/Self Care Home Management;Cryotherapy;Electrical Stimulation;Iontophoresis 4mg /ml Dexamethasone;Moist Heat;Traction;Ultrasound;Gait training;Stair training;Functional mobility training;Therapeutic  activities;Therapeutic exercise;Balance training;Neuromuscular re-education;Patient/family education;Orthotic Fit/Training;Manual techniques;Passive range of motion;Dry  needling;Taping;Vasopneumatic Device;Joint Manipulations;Spinal Manipulations    PT Next Visit Plan no CKC quad strength per referral, quad eccentrics, hip ABD strength, heel tap/step down    PT Home Exercise Plan Access Code [email or web address]    Consulted and Agree with Plan of Care Patient             Patient will benefit from skilled therapeutic intervention in order to improve the following deficits and impairments:  Abnormal gait, Decreased range of motion, Difficulty walking, Pain, Decreased balance, Decreased mobility, Decreased strength, Impaired flexibility, Hypomobility  Visit Diagnosis: Chronic pain of left knee  Muscle weakness (generalized)  Difficulty walking     Problem List Patient Active Problem List   Diagnosis Date Noted   NSVT (nonsustained ventricular tachycardia) (HCC) 05/20/2021   Paroxysmal atrial fibrillation ([place]) 05/20/2021   Pleurodynia 03/23/2021   Multiple fractures of ribs, right side, subsequent encounter for fracture with delayed healing 03/18/2021   Non-toxic multinodular goiter 02/25/2021   Nontoxic single thyroid nodule 02/25/2021   Personal history of malignant neoplasm of thyroid 02/25/2021   Hypothyroidism 02/25/2021   Thyroid cancer ([place]) 02/25/2021   Screen for colon cancer 02/20/2021   Chronic sinus bradycardia 02/17/2021   Primary osteoarthritis of both knees 02/17/2021   Primary osteoarthritis of both feet 02/17/2021   Primary osteoarthritis of left knee 09/23/2020   Routine general medical examination at health care facility 01/29/2020   Premature atrial contraction    BPH associated with nocturia 05/23/2019   Depression with anxiety 05/23/2019   Postoperative hypothyroidism 05/23/2019   Pes planus of both feet 09/11/2015      Laureen Abrahams, PT, DPT 08/18/21  12:23 PM    [facility] [address] [place], [place], [address] Phone: [phone]   Fax:  [phone]  Name: Rickey Cochran MRN: [id number] Date of Birth: [birth year]

## 2021-08-25 ENCOUNTER — Encounter: Payer: Self-pay | Admitting: Physical Therapy

## 2021-08-25 ENCOUNTER — Other Ambulatory Visit: Payer: Self-pay

## 2021-08-25 ENCOUNTER — Ambulatory Visit (INDEPENDENT_AMBULATORY_CARE_PROVIDER_SITE_OTHER): Payer: No Typology Code available for payment source | Admitting: Physical Therapy

## 2021-08-25 DIAGNOSIS — M25562 Pain in left knee: Secondary | ICD-10-CM

## 2021-08-25 DIAGNOSIS — M6281 Muscle weakness (generalized): Secondary | ICD-10-CM | POA: Diagnosis not present

## 2021-08-25 DIAGNOSIS — R262 Difficulty in walking, not elsewhere classified: Secondary | ICD-10-CM

## 2021-08-25 DIAGNOSIS — G8929 Other chronic pain: Secondary | ICD-10-CM | POA: Diagnosis not present

## 2021-08-25 NOTE — Therapy (Signed)
[facility] [address] [place], [place], [address] Phone: [phone]   Fax:  [phone]  Physical Therapy Treatment  Patient Details  Name: Rickey Cochran MRN: [id number] Date of Birth: 04-16-1961 Referring Provider (PT): Marlou Sa Tonna Corner, MD   Encounter Date: 08/25/2021   PT End of Session - 08/25/21 1224     Visit Number 4    Number of Visits 12    Date for PT Re-Evaluation 10/19/21    Authorization Type [employer]    PT Start Time 6599    PT Stop Time 3570    PT Time Calculation (min) 42 min    Activity Tolerance Patient tolerated treatment well    Behavior During Therapy [facility] for tasks assessed/performed             Past Medical History:  Diagnosis Date   Arthritis    Cancer ([place])    possible thyroid cancer, now s/p partial thyroidectomy    Depression    Hypertension    several years ago was on lisiopril but lost weight and no longer needs   Hypothyroidism    mgd on levothyroxine    Left-sided tinnitus    Premature atrial contraction    was seen on EKG at his PCP approx 8 years ago, denies any symptoms, reports he was a runner before onset of hip pain in feb 2020    Past Surgical History:  Procedure Laterality Date   COLONOSCOPY     IR THORACENTESIS ASP PLEURAL SPACE W/IMG GUIDE  03/20/2021   JOINT REPLACEMENT Right 04/13/2019   right total hip arthroplasty   THYROID LOBECTOMY Right 05/09/2018   THYROID LOBECTOMY Right 05/09/2018   Procedure: RIGHT THYROID LOBECTOMY;  Surgeon: Ralene Ok, MD;  Location: [place];  Service: General;  Laterality: Right;   TOTAL HIP ARTHROPLASTY Right 04/13/2019   Procedure: RIGHT TOTAL HIP ARTHROPLASTY ANTERIOR APPROACH;  Surgeon: Mcarthur Rossetti, MD;  Location[facility];  Service: Orthopedics;  Laterality: Right;    There were no vitals filed for this visit.   Subjective Assessment - 08/25/21 1134     Subjective alternating exercises - going to the gym every  few days as well. working on strengthening Lt leg    Limitations Lifting;Walking    How long can you sit comfortably? N/A    How long can you stand comfortably? limited by back pain rather than knee pain    How long can you walk comfortably? >1 hr    Diagnostic tests AP lateral merchant radiographs left knee reviewed.  Moderate to severe   medial compartment arthritis is present with moderate arthritis noted in   the lateral compartment.  There is also some patellofemoral spurring.     Slight varus alignment.  No acute fracture.  Patella height normal   relative to distal femur.    Patient Stated Goals Return to hiking, cycling, backpacking without pain    Currently in Pain? Yes    Pain Score 2     Pain Location Knee    Pain Orientation Left    Pain Descriptors / Indicators Aching    Pain Type Chronic pain    Pain Onset More than a month ago    Pain Frequency Intermittent    Aggravating Factors  standing, walking    Pain Relieving Factors rest  [facility] Adult PT Treatment/Exercise - 08/25/21 1133       Knee/Hip Exercises: Aerobic   Tread Mill 2.5-3.0 mph with incline 6-8% x 6 min; utilized uphill and in reverse mode    Recumbent Bike L4 x 6 min      Knee/Hip Exercises: Machines for Strengthening   Total Gym Leg Press bil push 168# 3x10; RLE 100# 3x10; sled at #1      Knee/Hip Exercises: Standing   Forward Step Up Left;1 set;10 reps;Hand Hold: 0;Step Height: 6"    Functional Squat Limitations with RLE toe touch; LLE mini squat to elevated surface 2x10      Manual Therapy   Manual therapy comments STM with compression to Lt medial quad              Trigger Point Dry Needling - 08/25/21 1214     Consent Given? Yes    Education Handout Provided Previously provided    Muscles Treated Lower Quadrant Vastus medialis    Vastus medialis Response Twitch response elicited                     PT Short Term Goals -  08/25/21 1225       PT SHORT TERM GOAL #1   Title Pt will become independent with HEP in order to demonstrate synthesis of PT education.    Time 2    Period Weeks    Status Achieved      PT SHORT TERM GOAL #2   Title Pt will be able to demonstrate step up without pain on L in order to demonstrate functional improvement in LE function for work and house hold duties.    Status Achieved      PT SHORT TERM GOAL #3   Title Pt will be able to demonstrate below parallel BW squat without ant L knee pain in order to demonstrate functional improvement in L LE function for exercise and house hold duties.    Status On-going               PT Long Term Goals - 08/25/21 1225       PT LONG TERM GOAL #1   Title Pt  will become independent with final HEP in order to demonstrate synthesis of PT education.    Time 8    Period Weeks    Status On-going      PT LONG TERM GOAL #2   Title Pt will demonstrate >/= 67 on FOTO measure in order to demonstrate a clinically significant change in LE function.    Time 8    Period Weeks    Status On-going      PT LONG TERM GOAL #3   Title Pt will be able to demonstrate ability to climb uphill on TM or stairs without pain in order to demonstrate functional improvement and tolerance to hiking simulated activity.    Time 8    Period Weeks    Status Achieved                   Plan - 08/25/21 1225     Clinical Impression Statement Pt is on track to meet all goals, likely at next visit.  Anticipate d/c next visit and will plan to check remaining goals and assist with final exercise guidelines to continue at d/c.    Personal Factors and Comorbidities Comorbidity 1;Comorbidity 2;Age;Time since onset of injury/illness/exacerbation    Examination-Activity Limitations Locomotion Level;Transfers;Squat;Stairs;Stand;Other    Examination-Participation  Restrictions Occupation;Community Activity;Other;Yard Work    Stability/Clinical Decision Making  Stable/Uncomplicated    Rehab Potential Good    PT Frequency 1x / week   1-2x   PT Duration 8 weeks    PT Treatment/Interventions ADLs/Self Care Home Management;Cryotherapy;Electrical Stimulation;Iontophoresis 4mg /ml Dexamethasone;Moist Heat;Traction;Ultrasound;Gait training;Stair training;Functional mobility training;Therapeutic activities;Therapeutic exercise;Balance training;Neuromuscular re-education;Patient/family education;Orthotic Fit/Training;Manual techniques;Passive range of motion;Dry needling;Taping;Vasopneumatic Device;Joint Manipulations;Spinal Manipulations    PT Next Visit Plan check remaining goals, plan for d/c    PT Home Exercise Plan Access Code [email or web address]    Consulted and Agree with Plan of Care Patient             Patient will benefit from skilled therapeutic intervention in order to improve the following deficits and impairments:  Abnormal gait, Decreased range of motion, Difficulty walking, Pain, Decreased balance, Decreased mobility, Decreased strength, Impaired flexibility, Hypomobility  Visit Diagnosis: Chronic pain of left knee  Muscle weakness (generalized)  Difficulty walking     Problem List Patient Active Problem List   Diagnosis Date Noted   NSVT (nonsustained ventricular tachycardia) 05/20/2021   Paroxysmal atrial fibrillation ([place]) 05/20/2021   Pleurodynia 03/23/2021   Multiple fractures of ribs, right side, subsequent encounter for fracture with delayed healing 03/18/2021   Non-toxic multinodular goiter 02/25/2021   Nontoxic single thyroid nodule 02/25/2021   Personal history of malignant neoplasm of thyroid 02/25/2021   Hypothyroidism 02/25/2021   Thyroid cancer ([place]) 02/25/2021   Screen for colon cancer 02/20/2021   Chronic sinus bradycardia 02/17/2021   Primary osteoarthritis of both knees 02/17/2021   Primary osteoarthritis of both feet 02/17/2021   Primary osteoarthritis of left knee 09/23/2020   Routine general medical examination at  health care facility 01/29/2020   Premature atrial contraction    BPH associated with nocturia 05/23/2019   Depression with anxiety 05/23/2019   Postoperative hypothyroidism 05/23/2019   Pes planus of both feet 09/11/2015      Laureen Abrahams, PT, DPT 08/25/21 12:27 PM     [facility] [address] [place], [place], [address] Phone: [phone]   Fax:  [phone]  Name: Rickey Cochran MRN: [id number] Date of Birth: 07-02-1961

## 2021-08-31 ENCOUNTER — Other Ambulatory Visit (HOSPITAL_COMMUNITY): Payer: Self-pay

## 2021-08-31 ENCOUNTER — Other Ambulatory Visit: Payer: Self-pay

## 2021-08-31 MED ORDER — LEVOTHYROXINE SODIUM 88 MCG PO TABS
ORAL_TABLET | ORAL | 1 refills | Status: DC
  Filled 2021-08-31: qty 90, 90d supply, fill #0

## 2021-09-01 ENCOUNTER — Ambulatory Visit (INDEPENDENT_AMBULATORY_CARE_PROVIDER_SITE_OTHER): Payer: No Typology Code available for payment source | Admitting: Physical Therapy

## 2021-09-01 ENCOUNTER — Other Ambulatory Visit: Payer: Self-pay

## 2021-09-01 ENCOUNTER — Encounter: Payer: Self-pay | Admitting: Physical Therapy

## 2021-09-01 ENCOUNTER — Other Ambulatory Visit (HOSPITAL_COMMUNITY): Payer: Self-pay

## 2021-09-01 DIAGNOSIS — G8929 Other chronic pain: Secondary | ICD-10-CM

## 2021-09-01 DIAGNOSIS — M6281 Muscle weakness (generalized): Secondary | ICD-10-CM

## 2021-09-01 DIAGNOSIS — R262 Difficulty in walking, not elsewhere classified: Secondary | ICD-10-CM | POA: Diagnosis not present

## 2021-09-01 DIAGNOSIS — M25562 Pain in left knee: Secondary | ICD-10-CM | POA: Diagnosis not present

## 2021-09-01 NOTE — Therapy (Signed)
[facility] [address] [place], [place], [address] Phone: [phone]   Fax:  [phone]  Physical Therapy TreatmentDischarge Summary  Patient Details  Name: Rickey Cochran MRN: [id number] Date of Birth: [birth year] Referring Provider (PT): Marlou Sa Tonna Corner, MD   Encounter Date: 09/01/2021   PT End of Session - 09/01/21 1144     Visit Number 5    Number of Visits 12    Date for PT Re-Evaluation 10/19/21    Authorization Type [employer]    PT Start Time 5974    PT Stop Time 1205    PT Time Calculation (min) 24 min    Activity Tolerance Patient tolerated treatment well    Behavior During Therapy [facility] for tasks assessed/performed             Past Medical History:  Diagnosis Date   Arthritis    Cancer ([place])    possible thyroid cancer, now s/p partial thyroidectomy    Depression    Hypertension    several years ago was on lisiopril but lost weight and no longer needs   Hypothyroidism    mgd on levothyroxine    Left-sided tinnitus    Premature atrial contraction    was seen on EKG at his PCP approx 8 years ago, denies any symptoms, reports he was a runner before onset of hip pain in feb 2020    Past Surgical History:  Procedure Laterality Date   COLONOSCOPY     IR THORACENTESIS ASP PLEURAL SPACE W/IMG GUIDE  03/20/2021   JOINT REPLACEMENT Right 04/13/2019   right total hip arthroplasty   THYROID LOBECTOMY Right 05/09/2018   THYROID LOBECTOMY Right 05/09/2018   Procedure: RIGHT THYROID LOBECTOMY;  Surgeon: Ralene Ok, MD;  Location: [place];  Service: General;  Laterality: Right;   TOTAL HIP ARTHROPLASTY Right 04/13/2019   Procedure: RIGHT TOTAL HIP ARTHROPLASTY ANTERIOR APPROACH;  Surgeon: Mcarthur Rossetti, MD;  [facility];  Service: Orthopedics;  Laterality: Right;    There were no vitals filed for this visit.   Subjective Assessment - 09/01/21 1142     Subjective knee only hurts when he  walks    Limitations Lifting;Walking    How long can you sit comfortably? N/A    How long can you stand comfortably? limited by back pain rather than knee pain    How long can you walk comfortably? >1 hr    Diagnostic tests AP lateral merchant radiographs left knee reviewed.  Moderate to severe   medial compartment arthritis is present with moderate arthritis noted in   the lateral compartment.  There is also some patellofemoral spurring.     Slight varus alignment.  No acute fracture.  Patella height normal   relative to distal femur.    Patient Stated Goals Return to hiking, cycling, backpacking without pain    Currently in Pain? No/denies                [facility] PT Assessment - 09/01/21 1150       Assessment   Medical Diagnosis M25.562,G89.29 (ICD-10-CM) - Chronic pain of left knee    Referring Provider (PT) Marlou Sa Tonna Corner, MD    Onset Date/Surgical Date 10/22/20      Observation/Other Assessments   Focus on Therapeutic Outcomes (FOTO)  34      Squat   Comments able to demonstrate below parallel squat without increase in pain or difficulty  [facility] Adult PT Treatment/Exercise - 09/01/21 1143       Exercises   Exercises Other Exercises    Other Exercises  verbally reviewed HEP and discussed modifications to step down as well as progressions.  pt verbalized understanding.      Knee/Hip Exercises: Aerobic   Recumbent Bike L6 x 6 min                       PT Short Term Goals - 09/01/21 1219       PT SHORT TERM GOAL #1   Title Pt will become independent with HEP in order to demonstrate synthesis of PT education.    Time 2    Period Weeks    Status Achieved      PT SHORT TERM GOAL #2   Title Pt will be able to demonstrate step up without pain on L in order to demonstrate functional improvement in LE function for work and house hold duties.    Status Achieved      PT SHORT TERM GOAL #3   Title Pt will be able  to demonstrate below parallel BW squat without ant L knee pain in order to demonstrate functional improvement in L LE function for exercise and house hold duties.    Status Achieved               PT Long Term Goals - 09/01/21 1219       PT LONG TERM GOAL #1   Title Pt  will become independent with final HEP in order to demonstrate synthesis of PT education.    Time 8    Period Weeks    Status Achieved      PT LONG TERM GOAL #2   Title Pt will demonstrate >/= 67 on FOTO measure in order to demonstrate a clinically significant change in LE function.    Time 8    Period Weeks    Status Achieved      PT LONG TERM GOAL #3   Title Pt will be able to demonstrate ability to climb uphill on TM or stairs without pain in order to demonstrate functional improvement and tolerance to hiking simulated activity.    Time 8    Period Weeks    Status Achieved                   Plan - 09/01/21 1219     Clinical Impression Statement Pt has met all goals and is ready for d/c from PT.  He has a good understanding of his HEP and progression.  Will d/c PT today.    Personal Factors and Comorbidities Comorbidity 1;Comorbidity 2;Age;Time since onset of injury/illness/exacerbation    Examination-Activity Limitations Locomotion Level;Transfers;Squat;Stairs;Stand;Other    Examination-Participation Restrictions Occupation;Community Activity;Other;Yard Work    Stability/Clinical Decision Making Stable/Uncomplicated    Rehab Potential Good    PT Frequency 1x / week   1-2x   PT Duration 8 weeks    PT Treatment/Interventions ADLs/Self Care Home Management;Cryotherapy;Electrical Stimulation;Iontophoresis 4mg /ml Dexamethasone;Moist Heat;Traction;Ultrasound;Gait training;Stair training;Functional mobility training;Therapeutic activities;Therapeutic exercise;Balance training;Neuromuscular re-education;Patient/family education;Orthotic Fit/Training;Manual techniques;Passive range of motion;Dry  needling;Taping;Vasopneumatic Device;Joint Manipulations;Spinal Manipulations    PT Next Visit Plan d/c PT today    PT Home Exercise Plan Access Code [email or web address]    Consulted and Agree with Plan of Care Patient             Patient will benefit from skilled therapeutic intervention in order to improve the following deficits and  impairments:  Abnormal gait, Decreased range of motion, Difficulty walking, Pain, Decreased balance, Decreased mobility, Decreased strength, Impaired flexibility, Hypomobility  Visit Diagnosis: Chronic pain of left knee  Muscle weakness (generalized)  Difficulty walking     Problem List Patient Active Problem List   Diagnosis Date Noted   NSVT (nonsustained ventricular tachycardia) 05/20/2021   Paroxysmal atrial fibrillation ([place]) 05/20/2021   Pleurodynia 03/23/2021   Multiple fractures of ribs, right side, subsequent encounter for fracture with delayed healing 03/18/2021   Non-toxic multinodular goiter 02/25/2021   Nontoxic single thyroid nodule 02/25/2021   Personal history of malignant neoplasm of thyroid 02/25/2021   Hypothyroidism 02/25/2021   Thyroid cancer ([place]) 02/25/2021   Screen for colon cancer 02/20/2021   Chronic sinus bradycardia 02/17/2021   Primary osteoarthritis of both knees 02/17/2021   Primary osteoarthritis of both feet 02/17/2021   Primary osteoarthritis of left knee 09/23/2020   Routine general medical examination at health care facility 01/29/2020   Premature atrial contraction    BPH associated with nocturia 05/23/2019   Depression with anxiety 05/23/2019   Postoperative hypothyroidism 05/23/2019   Pes planus of both feet 09/11/2015     Laureen Abrahams, PT, DPT 09/01/21 12:22 PM   [facility] [address] [place], [place], [address] Phone: [phone]   Fax:  [phone]  Name: Rickey Cochran MRN: [id number] Date of Birth: [birth year]      PHYSICAL THERAPY DISCHARGE  SUMMARY  Visits from Start of Care: 5  Current functional level related to goals / functional outcomes: See above   Remaining deficits: See above   Education / Equipment: HEP   Patient agrees to discharge. Patient goals were met. Patient is being discharged due to meeting the stated rehab goals.  Laureen Abrahams, PT, DPT 09/01/21 12:22 PM  [facility] [address] [place], [place], [address] Phone: [phone]   Fax:  [phone]

## 2021-09-10 ENCOUNTER — Other Ambulatory Visit (HOSPITAL_BASED_OUTPATIENT_CLINIC_OR_DEPARTMENT_OTHER): Payer: Self-pay

## 2021-09-10 MED ORDER — INFLUENZA VAC SPLIT QUAD 0.5 ML IM SUSY
PREFILLED_SYRINGE | INTRAMUSCULAR | 0 refills | Status: DC
  Filled 2021-09-10: qty 0.5, 1d supply, fill #0

## 2021-10-02 ENCOUNTER — Other Ambulatory Visit (HOSPITAL_COMMUNITY): Payer: Self-pay

## 2021-10-07 ENCOUNTER — Ambulatory Visit: Payer: No Typology Code available for payment source | Attending: Internal Medicine

## 2021-10-07 ENCOUNTER — Other Ambulatory Visit (HOSPITAL_BASED_OUTPATIENT_CLINIC_OR_DEPARTMENT_OTHER): Payer: Self-pay

## 2021-10-07 DIAGNOSIS — Z23 Encounter for immunization: Secondary | ICD-10-CM

## 2021-10-07 MED ORDER — PFIZER COVID-19 VAC BIVALENT 30 MCG/0.3ML IM SUSP
INTRAMUSCULAR | 0 refills | Status: DC
  Filled 2021-10-07: qty 0.3, 1d supply, fill #0

## 2021-10-07 NOTE — Progress Notes (Signed)
   Covid-19 Vaccination Clinic  Name:  JAXTYN LINVILLE    MRN: [id number] DOB: January 24, 1961  10/07/2021  Mr. Koren was observed post Covid-19 immunization for 15 minutes without incident. He was provided with Vaccine Information Sheet and instruction to access the V-Safe system.   Mr. Guterrez was instructed to call 911 with any severe reactions post vaccine: Difficulty breathing  Swelling of face and throat  A fast heartbeat  A bad rash all over body  Dizziness and weakness   Immunizations Administered     Name Date Dose VIS Date Route   Pfizer Covid-19 Vaccine Bivalent Booster 10/07/2021 11:19 AM 0.3 mL 07/22/2021 Intramuscular   Manufacturer: Media   Lot: [id number]   [place]: [phone]

## 2021-10-12 ENCOUNTER — Encounter: Payer: Self-pay | Admitting: Neurology

## 2021-10-12 NOTE — Progress Notes (Signed)
PATIENT: Rickey Cochran DOB: 1961/01/19  REASON FOR VISIT: follow up HISTORY FROM: patient  Chief Complaint  Patient presents with   New Patient (Initial Visit)    Pt alone, rm 16, pt states that he has not tolerated well. He feels like the machine gives him a headache. The air is blowing hard and its like its trying to force air and the mask moves. He feels like there is too much air coming. Makes it difficult to breath normal.      HISTORY OF PRESENT ILLNESS:  10/13/21 ALL:  Rickey Cochran is a 60 y.o. male here today for follow up for OSA on CPAP. He has a history of afib and was seen in consult with Dr Rexene Alberts for snoring and EDS. HST 05/2021 showed severe OSA with AHI 33.2/hr and O2 nadir of 84%. He has had a difficult time with CPAP therapy. He has used it twice in the past 45 days. He states that air pressure makes the mask move and he could not get comfortable. He felt that he could not breathe. He is disappointed with the set up education. He reports having a group session with multiple other patients and was not sure he understood how to use his machine. He does recognize health benefits of treating apnea and would like to continue working on tolerability.    HISTORY: (copied from Dr Guadelupe Sabin previous note)  Dear Dr. Ronnald Ramp,     I saw your patient, Rickey Cochran, upon your kind request in my sleep clinic today for initial consultation of his sleep disorder, in particular, concern for underlying obstructive sleep apnea.  The patient is unaccompanied today.  As you know, Mr. [name] is a 60 year old right-handed gentleman with an underlying medical history of sinus bradycardia, PACs, osteoarthritis, thyroid cancer, hypothyroidism, hypertension, depression, anxiety, BPH, recent rib fractures in April 7342 with complication of hemothorax, overweight state, and recent diagnosis of paroxysmal A. fib, who reports snoring and excessive daytime somnolence, as well as witnessed apneas  per wife's feedback.  I reviewed your office note from 02/17/2021.  His Epworth sleepiness score is 11 out of 24, fatigue severity score is 42 out of 63.  His wife is a Marine scientist and has encouraged him to get checked for sleep apnea.  His father has sleep apnea.  His father also has a pacemaker.  His mom has A. fib.  Patient reports not being able to stay awake for long in the evening, falls asleep quickly.  He goes to bed generally between 9 and 10 PM and rise time is generally around 6 AM.  He has nocturia about once or twice per average night but denies recurrent morning headaches.  He lives with his wife, he has a grown daughter and his stepson recently moved out.  They have 2 dogs in the household.  He typically does not watch TV in the bedroom.  He works in Scientist, clinical (histocompatibility and immunogenetics).  He has reduced his caffeine intake and limit himself to 2 cups of coffee in the mornings.  He does not drink alcohol and quit smoking in 2007.     REVIEW OF SYSTEMS: Out of a complete 14 system review of symptoms, the patient complains only of the following symptoms, none and all other reviewed systems are negative.   ALLERGIES: No Known Allergies  HOME MEDICATIONS: Outpatient Medications Prior to Visit  Medication Sig Dispense Refill   alfuzosin (UROXATRAL) 10 MG 24 hr tablet TAKE 1 TABLET BY MOUTH ONCE DAILY  WITH BREAKFAST 90 tablet 1   COVID-19 mRNA bivalent vaccine, Pfizer, (PFIZER COVID-19 VAC BIVALENT) injection Inject into the muscle. 0.3 mL 0   influenza vac split quadrivalent PF (FLUARIX) 0.5 ML injection Inject into the muscle. 0.5 mL 0   levothyroxine (SYNTHROID) 88 MCG tablet TAKE 1 TABLET BY MOUTH IN THE MORNING ON AN EMPTY STOMACH (Patient taking differently: Take 88 mcg by mouth daily before breakfast.) 90 tablet 1   meloxicam (MOBIC) 15 MG tablet TAKE 1 TABLET BY MOUTH DAILY. (Patient taking differently: Take 15 mg by mouth daily.) 90 tablet 1   metoprolol succinate (TOPROL XL) 25 MG 24 hr tablet Take 1 tablet  (25 mg total) by mouth daily. 90 tablet 3   Multiple Vitamin (MULTIVITAMIN WITH MINERALS) TABS tablet Take 1 tablet by mouth 3 (three) times daily.     sertraline (ZOLOFT) 100 MG tablet TAKE 1 TABLET BY MOUTH EVERY NIGHT AT BEDTIME 90 tablet 1   levothyroxine (SYNTHROID) 88 MCG tablet Take 1 tablet by mouth every morning on an empty stomach 90 tablet 1   Facility-Administered Medications Prior to Visit  Medication Dose Route Frequency Provider Last Rate Last Admin   betamethasone acetate-betamethasone sodium phosphate (CELESTONE) injection 3 mg  3 mg Intra-articular Once Edrick Kins, DPM        PAST MEDICAL HISTORY: Past Medical History:  Diagnosis Date   Arthritis    Cancer ([place])    possible thyroid cancer, now s/p partial thyroidectomy    Depression    Hypertension    several years ago was on lisiopril but lost weight and no longer needs   Hypothyroidism    mgd on levothyroxine    Left-sided tinnitus    Premature atrial contraction    was seen on EKG at his PCP approx 8 years ago, denies any symptoms, reports he was a runner before onset of hip pain in feb 2020    PAST SURGICAL HISTORY: Past Surgical History:  Procedure Laterality Date   COLONOSCOPY     IR THORACENTESIS ASP PLEURAL SPACE W/IMG GUIDE  03/20/2021   JOINT REPLACEMENT Right 04/13/2019   right total hip arthroplasty   THYROID LOBECTOMY Right 05/09/2018   THYROID LOBECTOMY Right 05/09/2018   Procedure: RIGHT THYROID LOBECTOMY;  Surgeon: Ralene Ok, MD;  Location: [facility];  Service: General;  Laterality: Right;   TOTAL HIP ARTHROPLASTY Right 04/13/2019   Procedure: RIGHT TOTAL HIP ARTHROPLASTY ANTERIOR APPROACH;  Surgeon: Mcarthur Rossetti, MD;  Location: [facility];  Service: Orthopedics;  Laterality: Right;    FAMILY HISTORY: History reviewed. No pertinent family history.  SOCIAL HISTORY: Social History   Socioeconomic History   Marital status: Married    Spouse name: Not on file   Number of  children: Not on file   Years of education: Not on file   Highest education level: Not on file  Occupational History    Comment: Self employed  Tobacco Use   Smoking status: Former   Smokeless tobacco: Never   Tobacco comments:    QUIT IN 2004  Vaping Use   Vaping Use: Never used  Substance and Sexual Activity   Alcohol use: Not Currently    Alcohol/week: 0.0 standard drinks    Comment: quit   Drug use: Never   Sexual activity: Not on file  Other Topics Concern   Not on file  Social History Narrative   He and his wife are vegan and very active in hiking, backpacking, cycling.   Social Determinants  of Health   Financial Resource Strain: Not on file  Food Insecurity: Not on file  Transportation Needs: Not on file  Physical Activity: Not on file  Stress: Not on file  Social Connections: Not on file  Intimate Partner Violence: Not on file     PHYSICAL EXAM  Vitals:   10/13/21 1317  BP: 114/74  Pulse: 65  Weight: 214 lb (97.1 kg)  Height: 6\' 2"  (1.88 m)   Body mass index is 27.48 kg/m.  Generalized: Well developed, in no acute distress  Neurological examination  Mentation: Alert oriented to time, place, history taking. Follows all commands speech and language fluent Cranial nerve II-XII: Pupils were equal round reactive to light. Extraocular movements were full, visual field were full  Motor: The motor testing reveals 5 over 5 strength of all 4 extremities. Good symmetric motor tone is noted throughout.  Gait and station: Gait is normal.    DIAGNOSTIC DATA (LABS, IMAGING, TESTING) - I reviewed patient records, labs, notes, testing and imaging myself where available.  No flowsheet data found.   Lab Results  Component Value Date   WBC 8.0 03/23/2021   HGB 15.2 03/23/2021   HCT 44.0 03/23/2021   MCV 92.3 03/23/2021   PLT 321.0 03/23/2021      Component Value Date/Time   NA 134 (L) 03/20/2021 1210   K 4.3 03/20/2021 1210   CL 98 03/20/2021 1210   CO2 26  03/20/2021 1210   GLUCOSE 102 (H) 03/20/2021 1210   BUN 12 03/20/2021 1210   CREATININE 0.82 03/20/2021 1210   CALCIUM 8.8 (L) 03/20/2021 1210   PROT 6.6 03/20/2021 1210   ALBUMIN 3.4 (L) 03/20/2021 1210   AST 22 03/20/2021 1210   ALT 22 03/20/2021 1210   ALKPHOS 41 03/20/2021 1210   BILITOT 1.5 (H) 03/20/2021 1210   GFRNONAA >60 03/20/2021 1210   GFRAA >60 04/14/2019 0347   Lab Results  Component Value Date   CHOL 243 (H) 02/17/2021   HDL 48.00 02/17/2021   LDLCALC 136 (H) 01/29/2020   LDLDIRECT 151.0 02/17/2021   TRIG 287.0 (H) 02/17/2021   CHOLHDL 5 02/17/2021   Lab Results  Component Value Date   HGBA1C 5.3 05/23/2019   No results found for: VITAMINB12 Lab Results  Component Value Date   TSH 1.52 02/17/2021     ASSESSMENT AND PLAN 60 y.o. year old male  has a past medical history of Arthritis, Cancer ([place]), Depression, Hypertension, Hypothyroidism, Left-sided tinnitus, and Premature atrial contraction. here with     ICD-10-CM   1. OSA on CPAP  G47.33 For home use only DME continuous positive airway pressure (CPAP)   Z99.89         BRAEDAN MEUTH is doing well on CPAP therapy. Compliance report reveals sub optimal usage. We will send orders for reeducation with deconditioning to help with comfort and tolerability of CPAP therapy. He was encouraged to use his machine for a few minutes every day to help get used to having the mask on. Risks of untreated sleep apnea review and education materials provided. Healthy lifestyle habits encouraged. He will follow up in 3-4 months, sooner if needed. He verbalizes understanding and agreement with this plan.    Orders Placed This Encounter  Procedures   For home use only DME continuous positive airway pressure (CPAP)    Patient needs reeducation on how to use CPAP. Please make sure mask fits appropriately, high leak, requests one on one training/education.    Order  Specific Question:   Length of Need    Answer:    Lifetime    Order Specific Question:   Patient has OSA or probable OSA    Answer:   Yes    Order Specific Question:   Is the patient currently using CPAP in the home    Answer:   Yes    Order Specific Question:   Settings    Answer:   Other see comments    Order Specific Question:   CPAP supplies needed    Answer:   Mask, headgear, cushions, filters, heated tubing and water chamber      No orders of the defined types were placed in this encounter.     Debbora Presto, FNP-C 10/13/2021, 2:02 PM [facility] [address], [place], [place] [address] [phone]

## 2021-10-12 NOTE — Patient Instructions (Addendum)
Please continue using your CPAP regularly. While your insurance requires that you use CPAP at least 4 hours each night on 70% of the nights, I recommend, that you not skip any nights and use it throughout the night if you can. Getting used to CPAP and staying with the treatment long term does take time and patience and discipline. Untreated obstructive sleep apnea when it is moderate to severe can have an adverse impact on cardiovascular health and raise her risk for heart disease, arrhythmias, hypertension, congestive heart failure, stroke and diabetes. Untreated obstructive sleep apnea causes sleep disruption, nonrestorative sleep, and sleep deprivation. This can have an impact on your day to day functioning and cause daytime sleepiness and impairment of cognitive function, memory loss, mood disturbance, and problems focussing. Using CPAP regularly can improve these symptoms.  Follow up in 3-4 months

## 2021-10-13 ENCOUNTER — Ambulatory Visit (INDEPENDENT_AMBULATORY_CARE_PROVIDER_SITE_OTHER): Payer: No Typology Code available for payment source | Admitting: Family Medicine

## 2021-10-13 ENCOUNTER — Encounter: Payer: Self-pay | Admitting: Family Medicine

## 2021-10-13 VITALS — BP 114/74 | HR 65 | Ht 74.0 in | Wt 214.0 lb

## 2021-10-13 DIAGNOSIS — G4733 Obstructive sleep apnea (adult) (pediatric): Secondary | ICD-10-CM

## 2021-10-13 DIAGNOSIS — Z9989 Dependence on other enabling machines and devices: Secondary | ICD-10-CM

## 2021-10-14 ENCOUNTER — Ambulatory Visit (INDEPENDENT_AMBULATORY_CARE_PROVIDER_SITE_OTHER): Payer: No Typology Code available for payment source | Admitting: Orthopedic Surgery

## 2021-10-14 ENCOUNTER — Telehealth: Payer: Self-pay

## 2021-10-14 ENCOUNTER — Ambulatory Visit: Payer: No Typology Code available for payment source | Admitting: Neurology

## 2021-10-14 ENCOUNTER — Other Ambulatory Visit: Payer: Self-pay

## 2021-10-14 ENCOUNTER — Ambulatory Visit (INDEPENDENT_AMBULATORY_CARE_PROVIDER_SITE_OTHER): Payer: No Typology Code available for payment source

## 2021-10-14 DIAGNOSIS — G8929 Other chronic pain: Secondary | ICD-10-CM

## 2021-10-14 DIAGNOSIS — M25562 Pain in left knee: Secondary | ICD-10-CM

## 2021-10-14 DIAGNOSIS — M545 Low back pain, unspecified: Secondary | ICD-10-CM | POA: Diagnosis not present

## 2021-10-14 NOTE — Telephone Encounter (Signed)
Per Dr Marlou Sa can we please change MRI of the knee to urgent?

## 2021-10-18 ENCOUNTER — Encounter: Payer: Self-pay | Admitting: Orthopedic Surgery

## 2021-10-18 NOTE — Progress Notes (Signed)
Office Visit Note   Patient: Rickey Cochran           Date of Birth: 1961-11-08           MRN: [id number] Visit Date: 10/14/2021 Requested by: Janith Lima, MD [address] 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  Last injection 07/08/2021.  Gave him 2 months of relief.  He is completing outpatient therapy.  States that his knee was drained and the cyst was drained but both of those have recurred.  Patient has completed physical therapy.  Both knees hurt and the pain is worse in the left knee since last visit.  He does Scientist, clinical (histocompatibility and immunogenetics).  Currently this is not his busy season.  Taking ibuprofen for pain.  Has most of his pain in the medial aspect of the left knee but some pain is global.  He also reports long history of low back pain.  The back pain is focal in nature and occurs every day.  Does affect his lifting as well as standing and walking endurance.  He describes pain which radiates down into both legs proximal posterior aspect.              ROS: All systems reviewed are negative as they relate to the chief complaint within the history of present illness.  Patient denies  fevers or chills.   Assessment & Plan: Visit Diagnoses:  1. Chronic pain of left knee   2. Low back pain, unspecified back pain laterality, unspecified chronicity, unspecified whether sciatica present     Plan: Impression is left knee pain with end-stage arthritis recurrent effusion and significant symptoms.  He has daily symptoms with his knee.  Needs MRI scan to evaluate whether or not he has arthritis in other compartments.  He may be a candidate for partial knee replacement.  The component of his complaints about the knee that encompassed the whole knee argue against that.  He does have good range of motion and enough flexibility on the medial aspect  that we could consider partial knee replacement.  Downside to that would be possible need for cemented revision down the road in 15 to 20 years if the partial knee replacement becomes loose.  We will need to closely scrutinize that MRI scan to evaluate particularly the patellofemoral compartment to see how much wear is present there and decide for or against partial knee replacement versus press-fit knee replacement.  Follow-Up Instructions: No follow-ups on file.   Orders:  Orders Placed This Encounter  Procedures   XR Lumbar Spine 2-3 Views   MR Lumbar Spine w/o contrast   MR Knee Left w/o contrast   No orders of the defined types were placed in this encounter.     Procedures: No procedures performed   Clinical Data: No additional findings.  Objective: Vital Signs: There were no vitals taken for this visit.  Physical Exam:   Constitutional: Patient appears well-developed HEENT:  Head: Normocephalic Eyes:EOM are normal Neck: Normal range of motion Cardiovascular: Normal rate Pulmonary/chest: Effort normal Neurologic: Patient is alert Skin: Skin is warm Psychiatric: Patient has normal mood and affect   Ortho Exam: Ortho exam demonstrates no nerve root tension signs.  No groin pain with internal ex rotation of the leg.  Has mild pain with forward and lateral bending  but no trochanteric tenderness.  No paresthesias L1 S1 bilaterally.  Pedal pulses palpable.  Negative clonus bilaterally.  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  Has mild patellofemoral crepitus bilaterally as well as medial joint line tenderness on the left.  Range of motion is 0-1 25.  Collateral and cruciate ligaments are stable.  We did discuss knee replacement partial versus complete.  Risk and benefits are also discussed including not limited to infection nerve and vessel damage knee stiffness as well as the nature of the rehabilitative process required.  Patient  understands risk and benefits and would like to proceed with some type of knee replacement this year.  We will use the MRI scan to evaluate the other compartments and make the appropriate decision.  I also need to be a game time decision based on what the joint surface actually looks like.  Specialty Comments:  No specialty comments available.  Imaging: No results found.   PMFS History: Patient Active Problem List   Diagnosis Date Noted   NSVT (nonsustained ventricular tachycardia) 05/20/2021   Paroxysmal atrial fibrillation ([place]) 05/20/2021   Pleurodynia 03/23/2021   Multiple fractures of ribs, right side, subsequent encounter for fracture with delayed healing 03/18/2021   Non-toxic multinodular goiter 02/25/2021   Nontoxic single thyroid nodule 02/25/2021   Personal history of malignant neoplasm of thyroid 02/25/2021   Hypothyroidism 02/25/2021   Thyroid cancer ([place]) 02/25/2021   Screen for colon cancer 02/20/2021   Chronic sinus bradycardia 02/17/2021   Primary osteoarthritis of both knees 02/17/2021   Primary osteoarthritis of both feet 02/17/2021   Primary osteoarthritis of left knee 09/23/2020   Routine general medical examination at health care facility 01/29/2020   Premature atrial contraction    BPH associated with nocturia 05/23/2019   Depression with anxiety 05/23/2019   Postoperative hypothyroidism 05/23/2019   Pes planus of both feet 09/11/2015   Past Medical History:  Diagnosis Date   Arthritis    Cancer ([place])    possible thyroid cancer, now s/p partial thyroidectomy    Depression    Hypertension    several years ago was on lisiopril but lost weight and no longer needs   Hypothyroidism    mgd on levothyroxine    Left-sided tinnitus    Premature atrial contraction    was seen on EKG at his PCP approx 8 years ago, denies any symptoms, reports he was a runner before onset of hip pain in feb 2020    No family history on file.  Past Surgical History:   Procedure Laterality Date   COLONOSCOPY     IR THORACENTESIS ASP PLEURAL SPACE W/IMG GUIDE  03/20/2021   JOINT REPLACEMENT Right 04/13/2019   right total hip arthroplasty   THYROID LOBECTOMY Right 05/09/2018   THYROID LOBECTOMY Right 05/09/2018   Procedure: RIGHT THYROID LOBECTOMY;  Surgeon: Ralene Ok, MD;  Location: [facility];  Service: General;  Laterality: Right;   TOTAL HIP ARTHROPLASTY Right 04/13/2019   Procedure: RIGHT TOTAL HIP ARTHROPLASTY ANTERIOR APPROACH;  Surgeon: Mcarthur Rossetti, MD;  Location: [facility];  Service: Orthopedics;  Laterality: Right;   Social History   Occupational History    Comment: Self employed  Tobacco Use   Smoking status: Former   Smokeless tobacco: Never   Tobacco comments:    QUIT IN 2004  Vaping Use   Vaping Use: Never used  Substance and Sexual Activity   Alcohol use: Not Currently    Alcohol/week: 0.0  standard drinks    Comment: quit   Drug use: Never   Sexual activity: Not on file

## 2021-10-19 NOTE — Telephone Encounter (Signed)
Order changed to stat

## 2021-10-22 ENCOUNTER — Ambulatory Visit
Admission: RE | Admit: 2021-10-22 | Discharge: 2021-10-22 | Disposition: A | Discharge status: Home / Self Care | Payer: No Typology Code available for payment source | Source: Ambulatory Visit | Attending: Orthopedic Surgery | Admitting: Orthopedic Surgery

## 2021-10-22 DIAGNOSIS — M545 Low back pain, unspecified: Secondary | ICD-10-CM

## 2021-10-22 DIAGNOSIS — G8929 Other chronic pain: Secondary | ICD-10-CM

## 2021-10-22 NOTE — Progress Notes (Signed)
He needs tka not uni based on patellofemoral oa pls change posting and lets use [facility] thx

## 2021-10-23 ENCOUNTER — Telehealth: Payer: Self-pay | Admitting: Orthopedic Surgery

## 2021-10-23 DIAGNOSIS — M545 Low back pain, unspecified: Secondary | ICD-10-CM

## 2021-10-23 NOTE — Telephone Encounter (Signed)
Pt called stating he had an MRI done and DR.Marlou Sa called him with the results yesterday but he missed the call. Pt would like to be tried again please.   [phone]

## 2021-10-24 NOTE — Telephone Encounter (Signed)
I called him and discussed his results.  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  I want to get the disposition from him and Dr. Ellene Route prior to canceling his surgery on the 22nd.  Please set up an urgent appointment to see Dr. Ellene Route next week for severe spinal stenosis.  Thank you

## 2021-10-26 ENCOUNTER — Other Ambulatory Visit (HOSPITAL_COMMUNITY): Payer: Self-pay

## 2021-10-26 NOTE — Telephone Encounter (Signed)
I submitted the referral as urgent thru [facility] to Dr. Ellene Route, they will contact pt to [place]

## 2021-10-27 ENCOUNTER — Other Ambulatory Visit (HOSPITAL_COMMUNITY): Payer: Self-pay

## 2021-10-28 ENCOUNTER — Other Ambulatory Visit: Payer: Self-pay | Admitting: Internal Medicine

## 2021-10-28 ENCOUNTER — Other Ambulatory Visit (HOSPITAL_COMMUNITY): Payer: Self-pay

## 2021-10-28 ENCOUNTER — Encounter: Payer: Self-pay | Admitting: Orthopedic Surgery

## 2021-10-28 DIAGNOSIS — F418 Other specified anxiety disorders: Secondary | ICD-10-CM

## 2021-10-28 MED ORDER — SERTRALINE HCL 100 MG PO TABS
ORAL_TABLET | Freq: Every day | ORAL | 1 refills | Status: DC
  Filled 2021-10-28: qty 90, 90d supply, fill #0
  Filled 2022-01-27: qty 90, 90d supply, fill #1

## 2021-11-02 ENCOUNTER — Other Ambulatory Visit: Payer: Self-pay

## 2021-11-03 ENCOUNTER — Telehealth: Payer: Self-pay | Admitting: Orthopedic Surgery

## 2021-11-03 NOTE — Telephone Encounter (Signed)
Pt called asking for a call back from [employer]. Pt is asking the time frame of how long it will take him to heal from upcoming surgery this week so he can set back surgery. Please call pt at [phone].

## 2021-11-03 NOTE — Telephone Encounter (Signed)
6 - 8w

## 2021-11-03 NOTE — Telephone Encounter (Signed)
I called patient and advised. 

## 2021-11-04 ENCOUNTER — Other Ambulatory Visit: Payer: Self-pay | Admitting: Neurological Surgery

## 2021-11-06 NOTE — Progress Notes (Signed)
Surgical Instructions    Your procedure is scheduled on 11/12/21.  Report to [facility] Main Entrance "A" at 10:15 A.M., then check in with the Admitting office.  Call this number if you have problems the morning of surgery:  [phone]   If you have any questions prior to your surgery date call [phone]: Open Monday-Friday 8am-4pm    Remember:  Do not eat after midnight the night before your surgery  You may drink clear liquids until 9:15 the morning of your surgery.   Clear liquids allowed are: Water, Non-Citrus Juices (without pulp), Carbonated Beverages, Clear Tea, Black Coffee ONLY (NO MILK, CREAM OR POWDERED CREAMER of any kind), and Gatorade  Please complete your PRE-SURGERY ENSURE that was provided to you by 9:15 the morning of surgery.  Please, if able, drink it in one setting. DO NOT SIP.     Take these medicines the morning of surgery with A SIP OF WATER:  alfuzosin (UROXATRAL) levothyroxine (SYNTHROID) metoprolol succinate (TOPROL XL)   As of today, STOP taking any Aspirin (unless otherwise instructed by your surgeon) meloxicam (MOBIC), Aleve, Naproxen, Ibuprofen, Motrin, Advil, Goody's, BC's, all herbal medications, fish oil, and all vitamins.   After your COVID test   You are not required to quarantine however you are required to wear a well-fitting mask when you are out and around people not in your household.  If your mask becomes wet or soiled, replace with a new one.  Wash your hands often with soap and water for 20 seconds or clean your hands with an alcohol-based hand sanitizer that contains at least 60% alcohol.  Do not share personal items.  Notify your provider: if you are in close contact with someone who has COVID  or if you develop a fever of 100.4 or greater, sneezing, cough, sore throat, shortness of breath or body aches.          Do not wear jewelry  Do not wear lotions, powders, colognes, or deodorant. Do not shave 48 hours prior to  surgery.  Men may shave face and neck. Do not bring valuables to the hospital.              [facility] is not responsible for any belongings or valuables.  Do NOT Smoke (Tobacco/Vaping)  24 hours prior to your procedure  If you use a CPAP at night, you may bring your mask for your overnight stay.   Contacts, glasses, hearing aids, dentures or partials may not be worn into surgery, please bring cases for these belongings   For patients admitted to the hospital, discharge time will be determined by your treatment team.   Patients discharged the day of surgery will not be allowed to drive home, and someone needs to stay with them for 24 hours.  NO VISITORS WILL BE ALLOWED IN PRE-OP WHERE PATIENTS ARE PREPPED FOR SURGERY.  ONLY 1 SUPPORT PERSON MAY BE PRESENT IN THE WAITING ROOM WHILE YOU ARE IN SURGERY.  IF YOU ARE TO BE ADMITTED, ONCE YOU ARE IN YOUR ROOM YOU WILL BE ALLOWED TWO (2) VISITORS. 1 (ONE) VISITOR MAY STAY OVERNIGHT BUT MUST ARRIVE TO THE ROOM BY 8pm.  Minor children may have two parents present. Special consideration for safety and communication needs will be reviewed on a case by case basis.  Special instructions:    Oral Hygiene is also important to reduce your risk of infection.  Remember - BRUSH YOUR TEETH THE MORNING OF SURGERY WITH YOUR REGULAR TOOTHPASTE  [place]- Preparing For Surgery  Before surgery, you can play an important role. Because skin is not sterile, your skin needs to be as free of germs as possible. You can reduce the number of germs on your skin by washing with CHG (chlorahexidine gluconate) Soap before surgery.  CHG is an antiseptic cleaner which kills germs and bonds with the skin to continue killing germs even after washing.     Please do not use if you have an allergy to CHG or antibacterial soaps. If your skin becomes reddened/irritated stop using the CHG.  Do not shave (including legs and underarms) for at least 48 hours prior to first CHG  shower. It is OK to shave your face.  Please follow these instructions carefully.     Shower the NIGHT BEFORE SURGERY and the MORNING OF SURGERY with CHG Soap.   If you chose to wash your hair, wash your hair first as usual with your normal shampoo. After you shampoo, rinse your hair and body thoroughly to remove the shampoo.  Then [employer] and genitals (private parts) with your normal soap and rinse thoroughly to remove soap.  After that Use CHG Soap as you would any other liquid soap. You can apply CHG directly to the skin and wash gently with a scrungie or a clean washcloth.   Apply the CHG Soap to your body ONLY FROM THE NECK DOWN.  Do not use on open wounds or open sores. Avoid contact with your eyes, ears, mouth and genitals (private parts). Wash Face and genitals (private parts)  with your normal soap.   Wash thoroughly, paying special attention to the area where your surgery will be performed.  Thoroughly rinse your body with warm water from the neck down.  DO NOT shower/wash with your normal soap after using and rinsing off the CHG Soap.  Pat yourself dry with a CLEAN TOWEL.  Wear CLEAN PAJAMAS to bed the night before surgery  Place CLEAN SHEETS on your bed the night before your surgery  DO NOT SLEEP WITH PETS.   Day of Surgery:  Take a shower with CHG soap. Wear Clean/Comfortable clothing the morning of surgery Do not apply any deodorants/lotions.   Remember to brush your teeth WITH YOUR REGULAR TOOTHPASTE.   Please read over the following fact sheets that you were given.

## 2021-11-09 ENCOUNTER — Other Ambulatory Visit: Payer: Self-pay

## 2021-11-09 ENCOUNTER — Encounter (HOSPITAL_COMMUNITY)
Admission: RE | Admit: 2021-11-09 | Discharge: 2021-11-09 | Disposition: A | Discharge status: Home / Self Care | Payer: No Typology Code available for payment source | Source: Ambulatory Visit | Attending: Orthopedic Surgery | Admitting: Orthopedic Surgery

## 2021-11-09 ENCOUNTER — Encounter (HOSPITAL_COMMUNITY): Payer: Self-pay

## 2021-11-09 VITALS — BP 124/68 | HR 46 | Temp 97.9°F | Resp 17 | Ht 74.0 in | Wt 213.6 lb

## 2021-11-09 DIAGNOSIS — I10 Essential (primary) hypertension: Secondary | ICD-10-CM | POA: Insufficient documentation

## 2021-11-09 DIAGNOSIS — Z20822 Contact with and (suspected) exposure to covid-19: Secondary | ICD-10-CM | POA: Diagnosis not present

## 2021-11-09 DIAGNOSIS — Z01812 Encounter for preprocedural laboratory examination: Secondary | ICD-10-CM | POA: Insufficient documentation

## 2021-11-09 DIAGNOSIS — E039 Hypothyroidism, unspecified: Secondary | ICD-10-CM | POA: Diagnosis not present

## 2021-11-09 DIAGNOSIS — Z01818 Encounter for other preprocedural examination: Secondary | ICD-10-CM

## 2021-11-09 DIAGNOSIS — G4733 Obstructive sleep apnea (adult) (pediatric): Secondary | ICD-10-CM | POA: Diagnosis not present

## 2021-11-09 DIAGNOSIS — M1712 Unilateral primary osteoarthritis, left knee: Secondary | ICD-10-CM | POA: Insufficient documentation

## 2021-11-09 DIAGNOSIS — Z87891 Personal history of nicotine dependence: Secondary | ICD-10-CM | POA: Diagnosis not present

## 2021-11-09 HISTORY — DX: Cardiac arrhythmia, unspecified: I49.9

## 2021-11-09 HISTORY — DX: Sleep apnea, unspecified: G47.30

## 2021-11-09 LAB — SURGICAL PCR SCREEN
MRSA, PCR: NEGATIVE
Staphylococcus aureus: NEGATIVE

## 2021-11-09 LAB — BASIC METABOLIC PANEL
Anion gap: 9 (ref 5–15)
BUN: 10 mg/dL (ref 6–20)
CO2: 23 mmol/L (ref 22–32)
Calcium: 9.4 mg/dL (ref 8.9–10.3)
Chloride: 109 mmol/L (ref 98–111)
Creatinine, Ser: 0.86 mg/dL (ref 0.61–1.24)
GFR, Estimated: >60 mL/min (ref >60)
Glucose, Bld: 95 mg/dL (ref 70–99)
Potassium: 4.4 mmol/L (ref 3.5–5.1)
Sodium: 141 mmol/L (ref 135–145)

## 2021-11-09 LAB — SARS CORONAVIRUS 2 (TAT 6-24 HRS): SARS Coronavirus 2: NEGATIVE

## 2021-11-09 LAB — CBC
HCT: 48.6 % (ref 39.0–52.0)
Hemoglobin: 16.8 g/dL (ref 13.0–17.0)
MCH: 32.3 pg (ref 26.0–34.0)
MCHC: 34.6 g/dL (ref 30.0–36.0)
MCV: 93.5 fL (ref 80.0–100.0)
Platelets: 227 10*3/uL (ref 150–400)
RBC: 5.2 MIL/uL (ref 4.22–5.81)
RDW: 12 % (ref 11.5–15.5)
WBC: 5.5 10*3/uL (ref 4.0–10.5)
nRBC: 0 % (ref 0.0–0.2)

## 2021-11-09 LAB — URINALYSIS, ROUTINE W REFLEX MICROSCOPIC
Bilirubin Urine: NEGATIVE
Glucose, UA: NEGATIVE mg/dL
Hgb urine dipstick: NEGATIVE
Ketones, ur: 15 mg/dL — AB
Leukocytes,Ua: NEGATIVE
Nitrite: NEGATIVE
Protein, ur: NEGATIVE mg/dL
Specific Gravity, Urine: 1.02 (ref 1.005–1.030)
pH: 6 (ref 5.0–8.0)

## 2021-11-09 NOTE — Progress Notes (Signed)
PCP - Scarlette Calico Cardiologist - Dr. Crissie Sickles Patient saw Dr. Lovena Le earlier this year for follow up from Sleep study, and echo.  Patient had a heart monitor placed for 30 days, and put on Metoprolol.  See Dr. Tanna Furry note from 03/31/21. Patient stated that Dr. Lovena Le does not know he is having surgery and is not sure when he is to follow up with Dr. Lovena Le.  Chest x-ray - 06/29/21 EKG - 05/20/21 ECHO - 04/28/21  SA - yes, does not wear CPAP every night  ERAS Protcol - yes, Ensure given   COVID TEST- 11/09/21   Anesthesia review: yes, heart history See above note   Patient denies shortness of breath, fever, cough and chest pain at PAT appointment   All instructions explained to the patient, with a verbal understanding of the material. Patient agrees to go over the instructions while at home for a better understanding. Patient also instructed to self quarantine after being tested for COVID-19. The opportunity to ask questions was provided.

## 2021-11-09 NOTE — Progress Notes (Signed)
Surgical Instructions    Your procedure is scheduled on Thursday 11/12/21.  Report to [facility] Main Entrance "A" at 10:15 A.M., then check in with the Admitting office.  Call this number if you have problems the morning of surgery:  [phone]   If you have any questions prior to your surgery date call [phone]: Open Monday-Friday 8am-4pm    Remember:  Do not eat after midnight the night before your surgery  You may drink clear liquids until 9:15 the morning of your surgery.   Clear liquids allowed are: Water, Non-Citrus Juices (without pulp), Carbonated Beverages, Clear Tea, Black Coffee ONLY (NO MILK, CREAM OR POWDERED CREAMER of any kind), and Gatorade  Please complete your PRE-SURGERY ENSURE that was provided to you by 9:15 the morning of surgery.  Please, if able, drink it in one setting. DO NOT SIP.     Take these medicines the morning of surgery with A SIP OF WATER: alfuzosin (UROXATRAL) levothyroxine (SYNTHROID) metoprolol succinate (TOPROL XL)  As of today, STOP taking any Aspirin (unless otherwise instructed by your surgeon) meloxicam (MOBIC), Aleve, Naproxen, Ibuprofen, Motrin, Advil, Goody's, BC's, all herbal medications, fish oil, and all vitamins.   DAY OF SURGERY:       Do not wear jewelry  Do not wear lotions, powders, colognes, or deodorant. Men may shave face and neck. Do not bring valuables to the hospital.              [facility] is not responsible for any belongings or valuables.  Do NOT Smoke (Tobacco/Vaping)  24 hours prior to your procedure  If you use a CPAP at night, you may bring your mask for your overnight stay.   Contacts, glasses, hearing aids, dentures or partials may not be worn into surgery, please bring cases for these belongings   For patients admitted to the hospital, discharge time will be determined by your treatment team.   Patients discharged the day of surgery will not be allowed to drive home, and someone needs to stay  with them for 24 hours.  NO VISITORS WILL BE ALLOWED IN PRE-OP WHERE PATIENTS ARE PREPPED FOR SURGERY.  ONLY 1 SUPPORT PERSON MAY BE PRESENT IN THE WAITING ROOM WHILE YOU ARE IN SURGERY.  IF YOU ARE TO BE ADMITTED, ONCE YOU ARE IN YOUR ROOM YOU WILL BE ALLOWED TWO (2) VISITORS. 1 (ONE) VISITOR MAY STAY OVERNIGHT BUT MUST ARRIVE TO THE ROOM BY 8pm.  Minor children may have two parents present. Special consideration for safety and communication needs will be reviewed on a case by case basis.  Special instructions:    Oral Hygiene is also important to reduce your risk of infection.  Remember - BRUSH YOUR TEETH THE MORNING OF SURGERY WITH YOUR REGULAR TOOTHPASTE   [facility]- Preparing For Surgery  Before surgery, you can play an important role. Because skin is not sterile, your skin needs to be as free of germs as possible. You can reduce the number of germs on your skin by washing with CHG (chlorahexidine gluconate) Soap before surgery.  CHG is an antiseptic cleaner which kills germs and bonds with the skin to continue killing germs even after washing.     Please do not use if you have an allergy to CHG or antibacterial soaps. If your skin becomes reddened/irritated stop using the CHG.  Do not shave (including legs and underarms) for at least 48 hours prior to first CHG shower. It is OK to shave your face.  Please follow these instructions carefully.     Shower the NIGHT BEFORE SURGERY and the MORNING OF SURGERY with CHG Soap.   If you chose to wash your hair, wash your hair first as usual with your normal shampoo. After you shampoo, rinse your hair and body thoroughly to remove the shampoo.  Then [employer] and genitals (private parts) with your normal soap and rinse thoroughly to remove soap.  After that Use CHG Soap as you would any other liquid soap. You can apply CHG directly to the skin and wash gently with a scrungie or a clean washcloth.   Apply the CHG Soap to your body ONLY FROM THE  NECK DOWN.  Do not use on open wounds or open sores. Avoid contact with your eyes, ears, mouth and genitals (private parts). Wash Face and genitals (private parts)  with your normal soap.   Wash thoroughly, paying special attention to the area where your surgery will be performed.  Thoroughly rinse your body with warm water from the neck down.  DO NOT shower/wash with your normal soap after using and rinsing off the CHG Soap.  Pat yourself dry with a CLEAN TOWEL.  Wear CLEAN PAJAMAS to bed the night before surgery  Place CLEAN SHEETS on your bed the night before your surgery  DO NOT SLEEP WITH PETS.   Day of Surgery:  Take a shower with CHG soap. Wear Clean/Comfortable clothing the morning of surgery Do not apply any deodorants/lotions.   Remember to brush your teeth WITH YOUR REGULAR TOOTHPASTE.   Please read over the following fact sheets that you were given.

## 2021-11-10 ENCOUNTER — Encounter (HOSPITAL_COMMUNITY): Payer: Self-pay

## 2021-11-10 LAB — URINE CULTURE: Culture: NO GROWTH
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
## 2021-11-10 NOTE — Anesthesia Preprocedure Evaluation (Addendum)
Anesthesia Evaluation  Patient identified by MRN, date of birth, ID band Patient awake    Reviewed: Allergy & Precautions, NPO status , Patient's Chart, lab work & pertinent test results  History of Anesthesia Complications Negative for: history of anesthetic complications  Airway Mallampati: I  TM Distance: >3 FB Neck ROM: Full    Dental no notable dental hx. (+) Dental Advisory Given   Pulmonary sleep apnea and Continuous Positive Airway Pressure Ventilation , former smoker,    Pulmonary exam normal        Cardiovascular hypertension, Pt. on home beta blockers and Pt. on medications Normal cardiovascular exam+ dysrhythmias   Echo 04/28/21: IMPRESSIONS  1. Left ventricular ejection fraction, by estimation, is 65 to 70%. Left  ventricular ejection fraction by 3D volume is 69 %. The left ventricle has  normal function. The left ventricle has no regional wall motion  abnormalities. Left ventricular diastolic  parameters were normal.  2. Right ventricular systolic function is normal. The right ventricular  size is normal. There is normal pulmonary artery systolic pressure.  3. The mitral valve is grossly normal. Trivial mitral valve  regurgitation. No evidence of mitral stenosis.  4. The aortic valve is tricuspid. There is mild thickening of the aortic  valve. Aortic valve regurgitation is not visualized. No aortic stenosis is  present.  5. The inferior vena cava is normal in size with greater than 50%  respiratory variability, suggesting right atrial pressure of 3 mmHg.  - Comparison(s): No prior Echocardiogram.  - Conclusion(s)/Recommendation(s): Frequent PACs throught exam.   Cardiac event monitor 03/02/21-03/31/21:  Study Highlights: 1. NSR with sinus brady and sinus tachy 2. NSVT, brief and infrequent. 3. Brief PAF 4. PAC's and PVC's 5. No prolonged pauses    Neuro/Psych PSYCHIATRIC DISORDERS Anxiety Depression  negative neurological ROS     GI/Hepatic negative GI ROS, Neg liver ROS,   Endo/Other  Hypothyroidism   Renal/GU negative Renal ROS     Musculoskeletal   Abdominal   Peds  Hematology   Anesthesia Other Findings   Reproductive/Obstetrics                           Anesthesia Physical Anesthesia Plan  ASA: 2  Anesthesia Plan: Spinal   Post-op Pain Management: Tylenol PO (pre-op) and Celebrex PO (pre-op)   Induction:   PONV Risk Score and Plan: 2 and Ondansetron and Propofol infusion  Airway Management Planned: Natural Airway  Additional Equipment:   Intra-op Plan:   Post-operative Plan:   Informed Consent: I have reviewed the patients History and Physical, chart, labs and discussed the procedure including the risks, benefits and alternatives for the proposed anesthesia with the patient or authorized representative who has indicated his/her understanding and acceptance.     Dental advisory given  Plan Discussed with: Anesthesiologist and CRNA  Anesthesia Plan Comments: (PAT note written 11/10/2021 by Myra Gianotti, PA-C. )       Anesthesia Quick Evaluation

## 2021-11-11 MED ORDER — TRANEXAMIC ACID 1000 MG/10ML IV SOLN
2000.0000 mg | INTRAVENOUS | Status: DC
  Filled 2021-11-11 (×2): qty 20

## 2021-11-12 ENCOUNTER — Observation Stay (HOSPITAL_COMMUNITY)
Admission: RE | Admit: 2021-11-12 | Discharge: 2021-11-13 | Disposition: A | Discharge status: Home / Self Care | Payer: No Typology Code available for payment source | Attending: Orthopedic Surgery | Admitting: Orthopedic Surgery

## 2021-11-12 ENCOUNTER — Ambulatory Visit (HOSPITAL_COMMUNITY): Payer: No Typology Code available for payment source | Admitting: Vascular Surgery

## 2021-11-12 ENCOUNTER — Other Ambulatory Visit: Payer: Self-pay

## 2021-11-12 ENCOUNTER — Encounter (HOSPITAL_COMMUNITY)
Admission: RE | Disposition: A | Discharge status: Home / Self Care | Payer: Self-pay | Source: Home / Self Care | Attending: Orthopedic Surgery

## 2021-11-12 ENCOUNTER — Ambulatory Visit (HOSPITAL_COMMUNITY): Payer: No Typology Code available for payment source | Admitting: Physician Assistant

## 2021-11-12 ENCOUNTER — Encounter (HOSPITAL_COMMUNITY): Payer: Self-pay | Admitting: Orthopedic Surgery

## 2021-11-12 DIAGNOSIS — M179 Osteoarthritis of knee, unspecified: Secondary | ICD-10-CM | POA: Diagnosis present

## 2021-11-12 DIAGNOSIS — M1712 Unilateral primary osteoarthritis, left knee: Secondary | ICD-10-CM | POA: Diagnosis present

## 2021-11-12 DIAGNOSIS — Z96652 Presence of left artificial knee joint: Secondary | ICD-10-CM

## 2021-11-12 DIAGNOSIS — Z01818 Encounter for other preprocedural examination: Secondary | ICD-10-CM

## 2021-11-12 DIAGNOSIS — I480 Paroxysmal atrial fibrillation: Secondary | ICD-10-CM | POA: Diagnosis not present

## 2021-11-12 DIAGNOSIS — Z96641 Presence of right artificial hip joint: Secondary | ICD-10-CM | POA: Insufficient documentation

## 2021-11-12 DIAGNOSIS — Z87891 Personal history of nicotine dependence: Secondary | ICD-10-CM | POA: Diagnosis not present

## 2021-11-12 DIAGNOSIS — E039 Hypothyroidism, unspecified: Secondary | ICD-10-CM | POA: Diagnosis not present

## 2021-11-12 HISTORY — PX: TOTAL KNEE ARTHROPLASTY: SHX125

## 2021-11-12 SURGERY — ARTHROPLASTY, KNEE, TOTAL
Anesthesia: Spinal | Site: Knee | Laterality: Left

## 2021-11-12 MED ORDER — ONDANSETRON HCL 4 MG/2ML IJ SOLN
INTRAMUSCULAR | Status: AC
  Filled 2021-11-12: qty 2

## 2021-11-12 MED ORDER — ACETAMINOPHEN 500 MG PO TABS
ORAL_TABLET | ORAL | Status: AC
  Administered 2021-11-12: 11:00:00 1000 mg
  Filled 2021-11-12: qty 2

## 2021-11-12 MED ORDER — 0.9 % SODIUM CHLORIDE (POUR BTL) OPTIME
TOPICAL | Status: DC | PRN
  Administered 2021-11-12: 13:00:00 1000 mL

## 2021-11-12 MED ORDER — CHLORHEXIDINE GLUCONATE 0.12 % MT SOLN
OROMUCOSAL | Status: AC
  Administered 2021-11-12: 11:00:00 15 mL
  Filled 2021-11-12: qty 15

## 2021-11-12 MED ORDER — BUPIVACAINE IN DEXTROSE 0.75-8.25 % IT SOLN
INTRATHECAL | Status: DC | PRN
  Administered 2021-11-12: 2 mL via INTRATHECAL

## 2021-11-12 MED ORDER — HYDROMORPHONE HCL 1 MG/ML IJ SOLN: 0.5000 mg | INTRAMUSCULAR | Status: DC | PRN

## 2021-11-12 MED ORDER — DEXAMETHASONE SODIUM PHOSPHATE 10 MG/ML IJ SOLN
INTRAMUSCULAR | Status: DC | PRN
  Administered 2021-11-12: 5 mg

## 2021-11-12 MED ORDER — MENTHOL 3 MG MT LOZG: 1.0000 | LOZENGE | OROMUCOSAL | Status: DC | PRN

## 2021-11-12 MED ORDER — METHOCARBAMOL 1000 MG/10ML IJ SOLN
500.0000 mg | Freq: Four times a day (QID) | INTRAVENOUS | Status: DC | PRN
  Filled 2021-11-12: qty 5

## 2021-11-12 MED ORDER — MIDAZOLAM HCL 2 MG/2ML IJ SOLN
INTRAMUSCULAR | Status: AC
  Administered 2021-11-12: 12:00:00 2 mg via INTRAVENOUS
  Filled 2021-11-12: qty 2

## 2021-11-12 MED ORDER — POVIDONE-IODINE 10 % EX SWAB: 2.0000 "application " | Freq: Once | CUTANEOUS | Status: DC

## 2021-11-12 MED ORDER — FENTANYL CITRATE (PF) 100 MCG/2ML IJ SOLN: 100.0000 ug | Freq: Once | INTRAMUSCULAR | Status: AC

## 2021-11-12 MED ORDER — FENTANYL CITRATE (PF) 100 MCG/2ML IJ SOLN
INTRAMUSCULAR | Status: AC
  Administered 2021-11-12: 12:00:00 100 ug via INTRAVENOUS
  Filled 2021-11-12: qty 2

## 2021-11-12 MED ORDER — OXYCODONE HCL 5 MG PO TABS
5.0000 mg | ORAL_TABLET | ORAL | Status: DC | PRN
  Filled 2021-11-12: qty 2

## 2021-11-12 MED ORDER — TRANEXAMIC ACID-NACL 1000-0.7 MG/100ML-% IV SOLN
1000.0000 mg | INTRAVENOUS | Status: AC
  Administered 2021-11-12: 13:00:00 1000 mg via INTRAVENOUS

## 2021-11-12 MED ORDER — ALFUZOSIN HCL ER 10 MG PO TB24
10.0000 mg | ORAL_TABLET | Freq: Every day | ORAL | Status: DC
  Administered 2021-11-13: 09:00:00 10 mg via ORAL
  Filled 2021-11-12: qty 1

## 2021-11-12 MED ORDER — CELECOXIB 100 MG PO CAPS
100.0000 mg | ORAL_CAPSULE | Freq: Two times a day (BID) | ORAL | Status: DC
  Administered 2021-11-12 – 2021-11-13 (×2): 100 mg via ORAL
  Filled 2021-11-12 (×4): qty 1

## 2021-11-12 MED ORDER — PHENYLEPHRINE HCL-NACL 20-0.9 MG/250ML-% IV SOLN
INTRAVENOUS | Status: DC | PRN
  Administered 2021-11-12: 25 ug/min via INTRAVENOUS

## 2021-11-12 MED ORDER — SERTRALINE HCL 100 MG PO TABS
100.0000 mg | ORAL_TABLET | Freq: Every day | ORAL | Status: DC
  Administered 2021-11-12: 22:00:00 100 mg via ORAL
  Filled 2021-11-12: qty 1

## 2021-11-12 MED ORDER — ACETAMINOPHEN 325 MG PO TABS: 325.0000 mg | ORAL_TABLET | Freq: Four times a day (QID) | ORAL | Status: DC | PRN

## 2021-11-12 MED ORDER — CEFAZOLIN SODIUM-DEXTROSE 2-4 GM/100ML-% IV SOLN
2.0000 g | Freq: Three times a day (TID) | INTRAVENOUS | Status: AC
  Administered 2021-11-12 – 2021-11-13 (×2): 2 g via INTRAVENOUS
  Filled 2021-11-12 (×2): qty 100

## 2021-11-12 MED ORDER — PHENYLEPHRINE 40 MCG/ML (10ML) SYRINGE FOR IV PUSH (FOR BLOOD PRESSURE SUPPORT)
PREFILLED_SYRINGE | INTRAVENOUS | Status: AC
  Filled 2021-11-12: qty 10

## 2021-11-12 MED ORDER — CELECOXIB 200 MG PO CAPS
ORAL_CAPSULE | ORAL | Status: AC
  Administered 2021-11-12: 11:00:00 200 mg
  Filled 2021-11-12: qty 1

## 2021-11-12 MED ORDER — METOPROLOL SUCCINATE ER 25 MG PO TB24
25.0000 mg | ORAL_TABLET | Freq: Every day | ORAL | Status: DC
  Filled 2021-11-12: qty 1

## 2021-11-12 MED ORDER — CEFAZOLIN SODIUM-DEXTROSE 2-4 GM/100ML-% IV SOLN
2.0000 g | INTRAVENOUS | Status: AC
  Administered 2021-11-12: 13:00:00 2 g via INTRAVENOUS

## 2021-11-12 MED ORDER — BUPIVACAINE LIPOSOME 1.3 % IJ SUSP
INTRAMUSCULAR | Status: DC | PRN
  Administered 2021-11-12: 20 mL

## 2021-11-12 MED ORDER — TRANEXAMIC ACID-NACL 1000-0.7 MG/100ML-% IV SOLN
INTRAVENOUS | Status: AC
  Filled 2021-11-12: qty 100

## 2021-11-12 MED ORDER — MIDAZOLAM HCL 2 MG/2ML IJ SOLN: 2.0000 mg | Freq: Once | INTRAMUSCULAR | Status: AC

## 2021-11-12 MED ORDER — ONDANSETRON HCL 4 MG/2ML IJ SOLN
INTRAMUSCULAR | Status: DC | PRN
  Administered 2021-11-12: 4 mg via INTRAVENOUS

## 2021-11-12 MED ORDER — METHOCARBAMOL 500 MG PO TABS: 500.0000 mg | ORAL_TABLET | Freq: Four times a day (QID) | ORAL | Status: DC | PRN

## 2021-11-12 MED ORDER — ONDANSETRON HCL 4 MG/2ML IJ SOLN: 4.0000 mg | Freq: Four times a day (QID) | INTRAMUSCULAR | Status: DC | PRN

## 2021-11-12 MED ORDER — SODIUM CHLORIDE 0.9 % IR SOLN
Status: DC | PRN
  Administered 2021-11-12: 3000 mL

## 2021-11-12 MED ORDER — TRANEXAMIC ACID 1000 MG/10ML IV SOLN
INTRAVENOUS | Status: DC | PRN
  Administered 2021-11-12: 13:00:00 2000 mg via TOPICAL

## 2021-11-12 MED ORDER — MORPHINE SULFATE (PF) 4 MG/ML IV SOLN
INTRAVENOUS | Status: AC
  Filled 2021-11-12: qty 2

## 2021-11-12 MED ORDER — PROPOFOL 500 MG/50ML IV EMUL
INTRAVENOUS | Status: DC | PRN
Start: 2021-11-12 — End: 2021-11-12
  Administered 2021-11-12 (×2): 75 ug/kg/min via INTRAVENOUS

## 2021-11-12 MED ORDER — CLONIDINE HCL (ANALGESIA) 100 MCG/ML EP SOLN
EPIDURAL | Status: AC
  Filled 2021-11-12: qty 10

## 2021-11-12 MED ORDER — IRRISEPT - 450ML BOTTLE WITH 0.05% CHG IN STERILE WATER, USP 99.95% OPTIME
TOPICAL | Status: DC | PRN
  Administered 2021-11-12: 13:00:00 450 mL via TOPICAL

## 2021-11-12 MED ORDER — ASPIRIN 81 MG PO CHEW
81.0000 mg | CHEWABLE_TABLET | Freq: Two times a day (BID) | ORAL | Status: DC
  Administered 2021-11-12 – 2021-11-13 (×2): 81 mg via ORAL
  Filled 2021-11-12 (×2): qty 1

## 2021-11-12 MED ORDER — BUPIVACAINE HCL (PF) 0.25 % IJ SOLN
INTRAMUSCULAR | Status: AC
  Filled 2021-11-12: qty 30

## 2021-11-12 MED ORDER — POVIDONE-IODINE 10 % EX SWAB
2.0000 "application " | Freq: Once | CUTANEOUS | Status: AC
  Administered 2021-11-12: 2 via TOPICAL

## 2021-11-12 MED ORDER — ROPIVACAINE HCL 7.5 MG/ML IJ SOLN
INTRAMUSCULAR | Status: DC | PRN
  Administered 2021-11-12: 20 mL via PERINEURAL

## 2021-11-12 MED ORDER — METOCLOPRAMIDE HCL 5 MG/ML IJ SOLN: 5.0000 mg | Freq: Three times a day (TID) | INTRAMUSCULAR | Status: DC | PRN

## 2021-11-12 MED ORDER — PROPOFOL 10 MG/ML IV BOLUS
INTRAVENOUS | Status: DC | PRN
  Administered 2021-11-12: 30 mg via INTRAVENOUS
  Administered 2021-11-12 (×2): 20 mg via INTRAVENOUS

## 2021-11-12 MED ORDER — VANCOMYCIN HCL 1000 MG IV SOLR
INTRAVENOUS | Status: DC | PRN
  Administered 2021-11-12: 1000 mg via TOPICAL

## 2021-11-12 MED ORDER — POVIDONE-IODINE 7.5 % EX SOLN
Freq: Once | CUTANEOUS | Status: DC
  Filled 2021-11-12: qty 118

## 2021-11-12 MED ORDER — DOCUSATE SODIUM 100 MG PO CAPS
100.0000 mg | ORAL_CAPSULE | Freq: Two times a day (BID) | ORAL | Status: DC
  Administered 2021-11-13: 09:00:00 100 mg via ORAL
  Filled 2021-11-12 (×2): qty 1

## 2021-11-12 MED ORDER — ACETAMINOPHEN 500 MG PO TABS
1000.0000 mg | ORAL_TABLET | Freq: Four times a day (QID) | ORAL | Status: AC
  Administered 2021-11-12 – 2021-11-13 (×4): 1000 mg via ORAL
  Filled 2021-11-12 (×4): qty 2

## 2021-11-12 MED ORDER — CLONIDINE HCL (ANALGESIA) 100 MCG/ML EP SOLN
EPIDURAL | Status: DC | PRN
  Administered 2021-11-12: 1 mL

## 2021-11-12 MED ORDER — POLYVINYL ALCOHOL 1.4 % OP SOLN
1.0000 [drp] | OPHTHALMIC | Status: DC | PRN
  Filled 2021-11-12: qty 15

## 2021-11-12 MED ORDER — LEVOTHYROXINE SODIUM 88 MCG PO TABS
88.0000 ug | ORAL_TABLET | Freq: Every day | ORAL | Status: DC
  Filled 2021-11-12: qty 1

## 2021-11-12 MED ORDER — SODIUM CHLORIDE 0.9% FLUSH
INTRAVENOUS | Status: DC | PRN
  Administered 2021-11-12: 20 mL

## 2021-11-12 MED ORDER — ONDANSETRON HCL 4 MG PO TABS: 4.0000 mg | ORAL_TABLET | Freq: Four times a day (QID) | ORAL | Status: DC | PRN

## 2021-11-12 MED ORDER — BUPIVACAINE HCL 0.25 % IJ SOLN
INTRAMUSCULAR | Status: DC | PRN
  Administered 2021-11-12: 30 mL

## 2021-11-12 MED ORDER — LACTATED RINGERS IV SOLN: INTRAVENOUS | Status: DC

## 2021-11-12 MED ORDER — BUPIVACAINE LIPOSOME 1.3 % IJ SUSP
INTRAMUSCULAR | Status: AC
  Filled 2021-11-12: qty 20

## 2021-11-12 MED ORDER — VANCOMYCIN HCL 1000 MG IV SOLR
INTRAVENOUS | Status: AC
  Filled 2021-11-12: qty 20

## 2021-11-12 MED ORDER — PHENOL 1.4 % MT LIQD: 1.0000 | OROMUCOSAL | Status: DC | PRN

## 2021-11-12 MED ORDER — METOCLOPRAMIDE HCL 5 MG PO TABS: 5.0000 mg | ORAL_TABLET | Freq: Three times a day (TID) | ORAL | Status: DC | PRN

## 2021-11-12 MED ORDER — CEFAZOLIN SODIUM-DEXTROSE 2-4 GM/100ML-% IV SOLN
INTRAVENOUS | Status: AC
  Filled 2021-11-12: qty 100

## 2021-11-12 MED ORDER — MORPHINE SULFATE (PF) 4 MG/ML IV SOLN
INTRAVENOUS | Status: DC | PRN
  Administered 2021-11-12: 8 mg via INTRAVENOUS

## 2021-11-12 SURGICAL SUPPLY — 73 items
BAG COUNTER SPONGE SURGICOUNT (BAG) ×2 IMPLANT
BAG DECANTER FOR FLEXI CONT (MISCELLANEOUS) ×3 IMPLANT
BAG SURGICOUNT SPONGE COUNTING (BAG) ×1
BANDAGE ESMARK 6X9 LF (GAUZE/BANDAGES/DRESSINGS) ×1 IMPLANT
BLADE SAG 18X100X1.27 (BLADE) ×3 IMPLANT
BLADE SAGITTAL (BLADE) ×2
BLADE SAW THK.89X75X18XSGTL (BLADE) ×1 IMPLANT
BNDG COHESIVE 6X5 TAN STRL LF (GAUZE/BANDAGES/DRESSINGS) ×3 IMPLANT
BNDG ELASTIC 6X15 VLCR STRL LF (GAUZE/BANDAGES/DRESSINGS) ×3 IMPLANT
BNDG ESMARK 6X9 LF (GAUZE/BANDAGES/DRESSINGS) ×3
CLOSURE WOUND 1/2 X4 (GAUZE/BANDAGES/DRESSINGS) ×2
CNTNR URN SCR LID CUP LEK RST (MISCELLANEOUS) ×1 IMPLANT
COMPONENT TRI CR RETAIN SZ6 LT (Orthopedic Implant) IMPLANT
CONT SPEC 4OZ STRL OR WHT (MISCELLANEOUS) ×2
COVER SURGICAL LIGHT HANDLE (MISCELLANEOUS) ×3 IMPLANT
CUFF TOURN SGL QUICK 34 (TOURNIQUET CUFF) ×2
CUFF TRNQT CYL 34X4.125X (TOURNIQUET CUFF) ×1 IMPLANT
DRAPE INCISE IOBAN 66X45 STRL (DRAPES) ×2 IMPLANT
DRAPE ORTHO SPLIT 77X108 STRL (DRAPES) ×4
DRAPE SURG ORHT 6 SPLT 77X108 (DRAPES) ×3 IMPLANT
DRAPE U-SHAPE 47X51 STRL (DRAPES) ×3 IMPLANT
DRSG AQUACEL AG ADV 3.5X14 (GAUZE/BANDAGES/DRESSINGS) ×2 IMPLANT
DURAPREP 26ML APPLICATOR (WOUND CARE) ×6 IMPLANT
ELECT CAUTERY BLADE 6.4 (BLADE) ×3 IMPLANT
ELECT REM PT RETURN 9FT ADLT (ELECTROSURGICAL) ×3
ELECTRODE REM PT RTRN 9FT ADLT (ELECTROSURGICAL) ×1 IMPLANT
GLOVE SRG 8 PF TXTR STRL LF DI (GLOVE) ×1 IMPLANT
GLOVE SURG ENC MOIS LTX SZ6.5 (GLOVE) ×9 IMPLANT
GLOVE SURG LTX SZ8 (GLOVE) ×3 IMPLANT
GLOVE SURG UNDER POLY LF SZ7 (GLOVE) ×3 IMPLANT
GLOVE SURG UNDER POLY LF SZ8 (GLOVE) ×2
GOWN STRL REUS W/ TWL LRG LVL3 (GOWN DISPOSABLE) ×3 IMPLANT
GOWN STRL REUS W/TWL LRG LVL3 (GOWN DISPOSABLE) ×6
HANDPIECE INTERPULSE COAX TIP (DISPOSABLE) ×2
HOOD PEEL AWAY FLYTE STAYCOOL (MISCELLANEOUS) ×9 IMPLANT
IMMOBILIZER KNEE 24 THIGH 36 (MISCELLANEOUS) IMPLANT
IMMOBILIZER KNEE 24 UNIV (MISCELLANEOUS) ×3
INSERT TIBIAL SZ7 CS 11 (Miscellaneous) ×2 IMPLANT
KIT BASIN OR (CUSTOM PROCEDURE TRAY) ×3 IMPLANT
KIT TURNOVER KIT B (KITS) ×3 IMPLANT
KNEE TIBIAL COMPONENT SZ7 (Knees) ×2 IMPLANT
MANIFOLD NEPTUNE II (INSTRUMENTS) ×3 IMPLANT
NDL SPNL 18GX3.5 QUINCKE PK (NEEDLE) ×1 IMPLANT
NEEDLE 22X1 1/2 (OR ONLY) (NEEDLE) ×6 IMPLANT
NEEDLE SPNL 18GX3.5 QUINCKE PK (NEEDLE) ×3 IMPLANT
NS IRRIG 1000ML POUR BTL (IV SOLUTION) ×6 IMPLANT
PACK TOTAL JOINT (CUSTOM PROCEDURE TRAY) ×3 IMPLANT
PAD ARMBOARD 7.5X6 YLW CONV (MISCELLANEOUS) ×6 IMPLANT
PAD CAST 4YDX4 CTTN HI CHSV (CAST SUPPLIES) ×1 IMPLANT
PADDING CAST COTTON 4X4 STRL (CAST SUPPLIES) ×2
PADDING CAST COTTON 6X4 STRL (CAST SUPPLIES) ×3 IMPLANT
PATELLA ASYMMETRIC 38X11 KNEE (Orthopedic Implant) ×2 IMPLANT
PIN FLUTED HEDLESS FIX 3.5X1/8 (PIN) ×2 IMPLANT
SET HNDPC FAN SPRY TIP SCT (DISPOSABLE) ×1 IMPLANT
SPONGE T-LAP 18X18 ~~LOC~~+RFID (SPONGE) ×6 IMPLANT
STRIP CLOSURE SKIN 1/2X4 (GAUZE/BANDAGES/DRESSINGS) ×4 IMPLANT
SUCTION FRAZIER HANDLE 10FR (MISCELLANEOUS) ×2
SUCTION TUBE FRAZIER 10FR DISP (MISCELLANEOUS) ×1 IMPLANT
SUT MNCRL AB 3-0 PS2 18 (SUTURE) ×2 IMPLANT
SUT VIC AB 0 CT1 27 (SUTURE) ×6
SUT VIC AB 0 CT1 27XBRD ANBCTR (SUTURE) IMPLANT
SUT VIC AB 1 CT1 36 (SUTURE) ×14 IMPLANT
SUT VIC AB 2-0 CT1 27 (SUTURE) ×4
SUT VIC AB 2-0 CT1 TAPERPNT 27 (SUTURE) IMPLANT
SYR 30ML LL (SYRINGE) ×9 IMPLANT
SYR TB 1ML LUER SLIP (SYRINGE) ×3 IMPLANT
TOWEL GREEN STERILE (TOWEL DISPOSABLE) ×6 IMPLANT
TOWEL GREEN STERILE FF (TOWEL DISPOSABLE) ×6 IMPLANT
TRAY FOL W/BAG SLVR 16FR STRL (SET/KITS/TRAYS/PACK) IMPLANT
TRAY FOLEY W/BAG SLVR 16FR LF (SET/KITS/TRAYS/PACK) ×2
TRIATH CRUCIATE RETAIN SZ6 KNE (Orthopedic Implant) ×3 IMPLANT
WATER STERILE IRR 1000ML POUR (IV SOLUTION) ×2 IMPLANT
YANKAUER SUCT BULB TIP NO VENT (SUCTIONS) ×3 IMPLANT

## 2021-11-12 NOTE — Anesthesia Procedure Notes (Signed)
Spinal  Patient location during procedure: OR Start time: 11/12/2021 12:37 PM End time: 11/12/2021 12:44 PM Reason for block: surgical anesthesia Staffing Performed: anesthesiologist  Anesthesiologist: Duane Boston, MD Preanesthetic Checklist Completed: patient identified, IV checked, risks and benefits discussed, surgical consent, monitors and equipment checked, pre-op evaluation and timeout performed Spinal Block Patient position: sitting Prep: DuraPrep Patient monitoring: cardiac monitor, continuous pulse ox and blood pressure Approach: midline Location: L2-3 Injection technique: single-shot Needle Needle type: Pencan  Needle gauge: 24 G Needle length: 9 cm Assessment Events: CSF return Additional Notes Functioning IV was confirmed and monitors were applied. Sterile prep and drape, including hand hygiene and sterile gloves were used. The patient was positioned and the spine was prepped. The skin was anesthetized with lidocaine.  Free flow of clear CSF was obtained prior to injecting local anesthetic into the CSF.  The spinal needle aspirated freely following injection.  The needle was carefully withdrawn.  The patient tolerated the procedure well.

## 2021-11-12 NOTE — Brief Op Note (Signed)
° °  11/12/2021  3:38 PM  PATIENT:  Rickey Cochran  60 y.o. male  PRE-OPERATIVE DIAGNOSIS:  left knee osteoarthritis  POST-OPERATIVE DIAGNOSIS:  left knee osteoarthritis  PROCEDURE:  Procedure(s): LEFT TOTAL KNEE ARTHROPLASTY  SURGEON:  Surgeon(s): Meredith Pel, MD  ASSISTANT: magnant pa  ANESTHESIA:   spinal  EBL: 50 ml    Total I/O In: 1200 [I.V.:1200] Out: 275 [Urine:225; Blood:50]  BLOOD ADMINISTERED: none  DRAINS: none   LOCAL MEDICATIONS USED: Marcaine morphine clonidine Exparel vancomycin powder  SPECIMEN:  No Specimen  COUNTS:  YES  TOURNIQUET:   Total Tourniquet Time Documented: Thigh (Left) - 72 minutes Total: Thigh (Left) - 72 minutes   DICTATION: .Other Dictation: Dictation Number [id number]  PLAN OF CARE: Admit for overnight observation  PATIENT DISPOSITION:  PACU - hemodynamically stable

## 2021-11-12 NOTE — H&P (Signed)
TOTAL KNEE ADMISSION H&P  Patient is being admitted for left total knee arthroplasty.  Subjective:  Chief Complaint:left knee pain.  HPI: Rickey Cochran, 60 y.o. male, has a history of pain and functional disability in the left knee due to arthritis and has failed non-surgical conservative treatments for greater than 12 weeks to includeNSAID's and/or analgesics, corticosteriod injections, viscosupplementation injections, flexibility and strengthening excercises, and activity modification.  Onset of symptoms was gradual, starting 8 years ago with gradually worsening course since that time. The patient noted no past surgery on the left knee(s).  Patient currently rates pain in the left knee(s) at 9 out of 10 with activity. Patient has night pain, worsening of pain with activity and weight bearing, pain that interferes with activities of daily living, pain with passive range of motion, crepitus, and joint swelling.  Patient has evidence of subchondral sclerosis and joint space narrowing by imaging studies. This patient has had  a history of back pain also but had consultation with neurosurgery and they recommend getting the knee fixed first prior to back surgery.  No personal history of DVT or pulmonary embolism. . There is no active infection.  Patient Active Problem List   Diagnosis Date Noted   NSVT (nonsustained ventricular tachycardia) 05/20/2021   Paroxysmal atrial fibrillation ([place]) 05/20/2021   Pleurodynia 03/23/2021   Multiple fractures of ribs, right side, subsequent encounter for fracture with delayed healing 03/18/2021   Non-toxic multinodular goiter 02/25/2021   Nontoxic single thyroid nodule 02/25/2021   Personal history of malignant neoplasm of thyroid 02/25/2021   Hypothyroidism 02/25/2021   Thyroid cancer ([place]) 02/25/2021   Screen for colon cancer 02/20/2021   Chronic sinus bradycardia 02/17/2021   Primary osteoarthritis of both knees 02/17/2021   Primary osteoarthritis of  both feet 02/17/2021   Primary osteoarthritis of left knee 09/23/2020   Routine general medical examination at health care facility 01/29/2020   Premature atrial contraction    BPH associated with nocturia 05/23/2019   Depression with anxiety 05/23/2019   Postoperative hypothyroidism 05/23/2019   Pes planus of both feet 09/11/2015   Past Medical History:  Diagnosis Date   Arthritis    Cancer ([place])    possible thyroid cancer, now s/p partial thyroidectomy    Depression    Dysrhythmia    Hypertension    several years ago was on lisiopril but lost weight and no longer needs   Hypothyroidism    mgd on levothyroxine    Left-sided tinnitus    Premature atrial contraction    was seen on EKG at his PCP approx 8 years ago, denies any symptoms, reports he was a runner before onset of hip pain in feb 2020   Sleep apnea     Past Surgical History:  Procedure Laterality Date   COLONOSCOPY     IR THORACENTESIS ASP PLEURAL SPACE W/IMG GUIDE  03/20/2021   JOINT REPLACEMENT Right 04/13/2019   right total hip arthroplasty   THYROID LOBECTOMY Right 05/09/2018   THYROID LOBECTOMY Right 05/09/2018   Procedure: RIGHT THYROID LOBECTOMY;  Surgeon: Ralene Ok, MD;  Location: [facility];  Service: General;  Laterality: Right;   TOTAL HIP ARTHROPLASTY Right 04/13/2019   Procedure: RIGHT TOTAL HIP ARTHROPLASTY ANTERIOR APPROACH;  Surgeon: Mcarthur Rossetti, MD;  Location: [facility];  Service: Orthopedics;  Laterality: Right;    Current Facility-Administered Medications  Medication Dose Route Frequency Provider Last Rate Last Admin   ceFAZolin (ANCEF) 2-4 GM/100ML-% IVPB  ceFAZolin (ANCEF) IVPB 2g/100 mL premix  2 g Intravenous On Call to OR Magnant, Gerrianne Scale, PA-C       lactated ringers infusion   Intravenous Continuous Duane Boston, MD 10 mL/hr at 11/12/21 1139 Continued from Pre-op at 11/12/21 1139   povidone-iodine (BETADINE) 7.5 % scrub   Topical Once Magnant, Charles L, PA-C        povidone-iodine 10 % swab 2 application  2 application Topical Once Magnant, Charles L, PA-C       tranexamic acid (CYKLOKAPRON) 1000MG /144mL IVPB            tranexamic acid (CYKLOKAPRON) 2,000 mg in sodium chloride 0.9 % 50 mL Topical Application  4,496 mg Topical To OR Meredith Pel, MD       tranexamic acid (CYKLOKAPRON) IVPB 1,000 mg  1,000 mg Intravenous To OR Magnant, Charles L, PA-C       Facility-Administered Medications Ordered in Other Encounters  Medication Dose Route Frequency Provider Last Rate Last Admin   dexamethasone (DECADRON) injection   Infiltration Anesthesia Intra-op Duane Boston, MD   5 mg at 11/12/21 1140   ropivacaine (PF) 7.5 mg/mL (0.75%) (NAROPIN) injection   Peri-NEURAL Anesthesia Intra-op Duane Boston, MD   20 mL at 11/12/21 1140   No Known Allergies  Social History   Tobacco Use   Smoking status: Former   Smokeless tobacco: Never   Tobacco comments:    QUIT IN 2004  Substance Use Topics   Alcohol use: Not Currently    Alcohol/week: 0.0 standard drinks    Comment: quit    History reviewed. No pertinent family history.   Review of Systems  Musculoskeletal:  Positive for arthralgias.  All other systems reviewed and are negative.  Objective:  Physical Exam Vitals reviewed.  HENT:     Head: Normocephalic.     Right Ear: Tympanic membrane normal.     Nose: Nose normal.     Mouth/Throat:     Mouth: Mucous membranes are moist.  Eyes:     Pupils: Pupils are equal, round, and reactive to light.  Cardiovascular:     Rate and Rhythm: Normal rate.     Pulses: Normal pulses.  Pulmonary:     Effort: Pulmonary effort is normal.  Abdominal:     General: Abdomen is flat.  Musculoskeletal:     Cervical back: Normal range of motion.  Skin:    General: Skin is warm.     Capillary Refill: Capillary refill takes less than 2 seconds.  Neurological:     General: No focal deficit present.     Mental Status: He is alert.  Psychiatric:        Mood  and Affect: Mood normal.    Ortho exam demonstrates no nerve root tension signs.  No groin pain with internal ex rotation of the leg.  Has mild pain with forward and lateral bending but no trochanteric tenderness.  No paresthesias L1 S1 bilaterally.  Pedal pulses palpable.  Negative clonus bilaterally.  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  Has mild patellofemoral crepitus bilaterally as well as medial joint line tenderness on the left.  Range of motion is 0-1 25.  Collateral and cruciate ligaments are stable. Vital signs in last 24 hours: Temp:  [98.4 F (36.9 C)] 98.4 F (36.9 C) (12/22 1020) Pulse Rate:  [36-59] 36 (12/22 1135) Resp:  [14-17] 14 (12/22 1135) BP: (136)/(84) 136/84 (12/22 1020) SpO2:  [96 %-98 %]  96 % (12/22 1135) Weight:  [95.3 kg] 95.3 kg (12/22 1020)  Labs:   Estimated body mass index is 26.96 kg/m as calculated from the following:   Height as of this encounter: 6\' 2"  (1.88 m).   Weight as of this encounter: 95.3 kg.   Imaging Review Plain radiographs demonstrate severe degenerative joint disease of the left knee(s). The overall alignment ismild varus. The bone quality appears to be good for age and reported activity level.      Assessment/Plan:  End stage arthritis, left knee   The patient history, physical examination, clinical judgment of the provider and imaging studies are consistent with end stage degenerative joint disease of the left knee(s) and total knee arthroplasty is deemed medically necessary. The treatment options including medical management, injection therapy arthroscopy and arthroplasty were discussed at length. The risks and benefits of total knee arthroplasty were presented and reviewed. The risks due to aseptic loosening, infection, stiffness, patella tracking problems, thromboembolic complications and other imponderables were discussed. The patient acknowledged the explanation, agreed to proceed with  the plan and consent was signed. Patient is being admitted for inpatient treatment for surgery, pain control, PT, OT, prophylactic antibiotics, VTE prophylaxis, progressive ambulation and ADL's and discharge planning. The patient is planning to be discharged she she is SHE is pushing really hard for     Patient's anticipated LOS is less than 2 midnights, meeting these requirements: - Younger than 13 - Lives within 1 hour of care - Has a competent adult at home to recover with post-op recover - NO history of  - Chronic pain requiring opiods  - Diabetes  - Coronary Artery Disease  - Heart failure  - Heart attack  - Stroke  - DVT/VTE  - Cardiac arrhythmia  - Respiratory Failure/COPD  - Renal failure  - Anemia  - Advanced Liver disease

## 2021-11-12 NOTE — Anesthesia Procedure Notes (Signed)
Anesthesia Regional Block: Adductor canal block   Pre-Anesthetic Checklist: , timeout performed,  Correct Patient, Correct Site, Correct Laterality,  Correct Procedure, Correct Position, site marked,  Risks and benefits discussed,  Surgical consent,  Pre-op evaluation,  At surgeon's request and post-op pain management  Laterality: Left  Prep: chloraprep       Needles:  Injection technique: Single-shot  Needle Type: Stimulator Needle - 80     Needle Length: 10cm  Needle Gauge: 21     Additional Needles:   Narrative:  Start time: 11/12/2021 11:32 AM End time: 11/12/2021 11:42 AM Injection made incrementally with aspirations every 5 mL.  Performed by: Personally  Anesthesiologist: Duane Boston, MD

## 2021-11-12 NOTE — Op Note (Signed)
NAME: Rickey Cochran, Rickey Cochran MEDICAL RECORD NO: [id number] ACCOUNT NO: [id number] DATE OF BIRTH: [birth year] FACILITY: [facility] LOCATION: [facility] PHYSICIAN: Yetta Barre. Marlou Sa, MD  Operative Report   DATE OF PROCEDURE: 11/12/2021  PREOPERATIVE DIAGNOSIS:  Left knee arthritis.  POSTOPERATIVE DIAGNOSIS:  Left knee arthritis.  PROCEDURE:  Left total knee replacement using Stryker press-fit components, size 6 femur, 7 tibia, 11 mm deep dish polyethylene insert, posterior cruciate retaining with 38 mm 3-peg press-fit patella.  SURGEON:  Yetta Barre. Marlou Sa, MD  ASSISTANT: Annie Main, PA.  INDICATIONS:  The patient is a 60 year old patient with left knee pain and arthritis refractory to nonoperative management.  He presents for operative management after explanation of risks and benefits.  DESCRIPTION OF PROCEDURE:  The patient was brought to the operating room where spinal anesthetic was induced.  Preoperative antibiotics administered.  Timeout was called.  Left leg was examined under anesthesia and found to have full extension and  flexion to about 130.  Collaterals were stable.  Left leg was prescrubbed with alcohol and Betadine and allowed to air dry, prepped with DuraPrep solution and draped in sterile manner.  Ioban used to cover the operative field.  A timeout was called.   Left leg was exsanguinated and elevated with the Esmarch wrap, tourniquet was inflated.  Anterior approach to knee was made, after the incision IrriSept solution was utilized.  Median parapatellar arthrotomy was marked with a #1 Vicryl suture.  IrriSept  solution also utilized in the arthrotomy.  Lateral patellofemoral ligament was released.  Fat pad partially excised.  Minimal medial soft tissue dissection was performed.  Patella was everted.  Soft tissue removed from the anterior distal femur.  Knee  flexed.  Anterior horn of the lateral meniscus and ACL were released.  Osteophytes were removed.  The posterior retractor and lateral  Hohmann retractors were placed.  Intramedullary alignment was then used to make a cut matching the patient's native  tibial slope perpendicular to the mechanical axis.  A 9 mm off the least affected lateral tibial plateau was utilized and later revised 2 more millimeters.  An 8 mm cut, then made off the distal femur in 5 degrees of valgus.  The patient achieved full  extension with both the 9 and 11 spacer.  Next, anterior, posterior and chamfer cuts were made on the femur, which sized to a size 6.  Tibia sized to a size 7.  Tibial trial baseplate was placed in the appropriate rotation marked.  The tibial baseplate  was tapped into position.  The femur was placed into the position and an 11 spacer was placed.  This allowed full extension, very good stability to varus and valgus stress at 0, 30 and 90 degrees with no liftoff.  Patella was then cut down from 28 to 16  mm and a 3-PEG trial patellar button was placed.  With all trials in position the patient had full extension, full flexion with no liftoff, and excellent patellar tracking using no thumbs technique.  Trial components were removed.  Final preparation made  on the tibia.  Next, a thorough irrigation was performed with pulsatile irrigation 3 liters.  Next, tranexamic acid sponge was allowed to sit within the incision along with IrriSept solution for 3 minutes.  This was removed.  Vancomycin powder placed  into the intramedullary canal, tibia then tapped into position with excellent press fit obtained and the high quality bone.  Next, the femur was tapped into position with again excellent press fit obtained.  An 11 mm polyethylene insert was placed.  Same  stability parameters were maintained.  The patella was then placed into position and clamped in a good position.  Tourniquet was released, bleeding points encountered were controlled using bipolar electrocautery, pouring irrigation x3 liters utilized.   Arthrotomy closed over bolster using #1  Vicryl suture.  Prior to final arthrotomy closure, the knee joint again was irrigated with IrriSept solution and then vancomycin powder placed.  Final arthrotomy closure performed there with a #1 Vicryl suture.   IrriSept solution again utilized above the arthrotomy.  A solution of Marcaine, morphine, clonidine injected into the knee for added postoperative pain relief.  It should be noted that at the time of tranexamic acid sponge placement a solution of  Marcaine, Exparel and saline was injected into the capsule for pain relief. After arthrotomy closure we injected the Marcaine and morphine, clonidine.  Next IrriSept solution and vancomycin powder again placed above the arthrotomy.  Skin was then closed  using interrupted inverted 0 Vicryl suture, 2-0 Vicryl suture, and 3-0 Monocryl with Steri-Strips.  Aquacel dressing, Ace wrap and knee immobilizer applied with iceman also placed.  The patient tolerated the procedure well without immediate complication  and transferred to the recovery room in stable condition.  Luke's assistance was required at all times during the case for retraction, opening, closing, mobilization of tissues.  His assistance was a medical necessity.   PUS D: 11/12/2021 3:44:10 pm T: 11/12/2021 7:30:00 pm  JOB: [id number]/ [id number]

## 2021-11-12 NOTE — Anesthesia Procedure Notes (Signed)
Procedure Name: MAC Date/Time: 11/12/2021 12:42 PM Performed by: Carolan Clines, CRNA Pre-anesthesia Checklist: Patient identified, Emergency Drugs available, Suction available, Patient being monitored and Timeout performed Patient Re-evaluated:Patient Re-evaluated prior to induction Oxygen Delivery Method: Simple face mask Dental Injury: Teeth and Oropharynx as per pre-operative assessment

## 2021-11-12 NOTE — Progress Notes (Signed)
Orthopedic Tech Progress Note Patient Details:  Rickey Cochran 1961-05-04 [id number]  Ortho Devices Type of Ortho Device: Bone foam zero knee, Lumbar corsett Ortho Device/Splint Location: left Ortho Device/Splint Interventions: Ordered, Application      [name] [name] 11/12/2021, 5:41 PM Applied bone foam and delivered LSO to room.

## 2021-11-12 NOTE — Transfer of Care (Signed)
Immediate Anesthesia Transfer of Care Note  Patient: Rickey Cochran  Procedure(s) Performed: LEFT TOTAL KNEE ARTHROPLASTY (Left: Knee)  Patient Location: PACU  Anesthesia Type:Spinal and MAC combined with regional for post-op pain  Level of Consciousness: awake, alert  and oriented  Airway & Oxygen Therapy: Patient Spontanous Breathing  Post-op Assessment: Report given to RN and Post -op Vital signs reviewed and stable  Post vital signs: Reviewed and stable  Last Vitals:  Vitals Value Taken Time  BP 95/57 11/12/21 1537  Temp 36.6 C 11/12/21 1535  Pulse 59 11/12/21 1538  Resp 11 11/12/21 1538  SpO2 96 % 11/12/21 1538  Vitals shown include unvalidated device data.  Last Pain:  Vitals:   11/12/21 1050  TempSrc:   PainSc: 0-No pain         Complications: No notable events documented.

## 2021-11-12 NOTE — Anesthesia Postprocedure Evaluation (Signed)
Anesthesia Post Note  Patient: BURNARD ENIS  Procedure(s) Performed: LEFT TOTAL KNEE ARTHROPLASTY (Left: Knee)     Patient location during evaluation: PACU Anesthesia Type: Spinal Level of consciousness: awake and alert Pain management: pain level controlled Vital Signs Assessment: post-procedure vital signs reviewed and stable Respiratory status: spontaneous breathing and respiratory function stable Cardiovascular status: blood pressure returned to baseline and stable Postop Assessment: spinal receding Anesthetic complications: no   No notable events documented.  Last Vitals:  Vitals:   11/12/21 1550 11/12/21 1605  BP: 97/62 97/69  Pulse: 60 (!) 54  Resp: 13 12  Temp:  (!) 36.4 C  SpO2: 97% 98%    Last Pain:  Vitals:   11/12/21 1605  TempSrc:   PainSc: 0-No pain    LLE Motor Response: Purposeful movement (11/12/21 1605) LLE Sensation: Full sensation (11/12/21 1605) RLE Motor Response: Purposeful movement (11/12/21 1605) RLE Sensation: Full sensation (11/12/21 1605) L Sensory Level: S1-Sole of foot, small toes (11/12/21 1605) R Sensory Level: S1-Sole of foot, small toes (11/12/21 1605)  Revanth Neidig DANIEL

## 2021-11-13 ENCOUNTER — Other Ambulatory Visit (HOSPITAL_COMMUNITY): Payer: Self-pay

## 2021-11-13 DIAGNOSIS — M1712 Unilateral primary osteoarthritis, left knee: Secondary | ICD-10-CM | POA: Diagnosis not present

## 2021-11-13 MED ORDER — LEVOTHYROXINE SODIUM 88 MCG PO TABS
88.0000 ug | ORAL_TABLET | Freq: Every day | ORAL | Status: DC
  Administered 2021-11-13: 11:00:00 88 ug via ORAL

## 2021-11-13 MED ORDER — CELECOXIB 100 MG PO CAPS
100.0000 mg | ORAL_CAPSULE | Freq: Two times a day (BID) | ORAL | 0 refills | Status: DC
  Filled 2021-11-13: qty 42, 21d supply, fill #0

## 2021-11-13 MED ORDER — ASPIRIN 81 MG PO CHEW
81.0000 mg | CHEWABLE_TABLET | Freq: Two times a day (BID) | ORAL | 0 refills | Status: DC
  Filled 2021-11-13: qty 42, 21d supply, fill #0

## 2021-11-13 MED ORDER — TRAMADOL HCL 50 MG PO TABS
50.0000 mg | ORAL_TABLET | Freq: Four times a day (QID) | ORAL | Status: DC
  Administered 2021-11-13: 12:00:00 50 mg via ORAL
  Filled 2021-11-13: qty 1

## 2021-11-13 MED ORDER — TRAMADOL HCL 50 MG PO TABS
50.0000 mg | ORAL_TABLET | Freq: Four times a day (QID) | ORAL | 0 refills | Status: DC
Start: 2021-11-13 — End: 2021-12-10
  Filled 2021-11-13: qty 30, 8d supply, fill #0

## 2021-11-13 MED ORDER — METHOCARBAMOL 500 MG PO TABS
500.0000 mg | ORAL_TABLET | Freq: Four times a day (QID) | ORAL | 0 refills | Status: DC | PRN
Start: 2021-11-13 — End: 2022-03-23
  Filled 2021-11-13: qty 30, 8d supply, fill #0

## 2021-11-13 NOTE — Progress Notes (Signed)
°  Subjective: Patient stable.  Mobilizing with PT.  Pain okay.  Tramadol for pain.   Objective: Vital signs in last 24 hours: Temp:  [97.5 F (36.4 C)-98.1 F (36.7 C)] 97.5 F (36.4 C) (12/23 0742) Pulse Rate:  [43-75] 62 (12/23 0742) Resp:  [12-18] 16 (12/23 0742) BP: (95-109)/(55-71) 105/71 (12/23 0742) SpO2:  [95 %-99 %] 98 % (12/23 0742)  Intake/Output from previous day: 12/22 0701 - 12/23 0700 In: 2321.4 [P.O.:480; I.V.:1741.4; IV Piggyback:100] Out: 2775 [Urine:2725; Blood:50] Intake/Output this shift: No intake/output data recorded.  Exam:  Sensation intact distally Intact pulses distally Dorsiflexion/Plantar flexion intact  Labs: No results for input(s): HGB in the last 72 hours. No results for input(s): WBC, RBC, HCT, PLT in the last 72 hours. No results for input(s): NA, K, CL, CO2, BUN, CREATININE, GLUCOSE, CALCIUM in the last 72 hours. No results for input(s): LABPT, INR in the last 72 hours.  Assessment/Plan: Plan is discharge home today after physical therapy.   Rickey Cochran Rickey Cochran 11/13/2021, 12:02 PM

## 2021-11-13 NOTE — Progress Notes (Signed)
10mg  oxycodone wasted with June Leap after discharge for this patient

## 2021-11-13 NOTE — Progress Notes (Signed)
Mobility Specialist Progress Note:   11/13/21 1015  Mobility  Activity Ambulated in hall  Level of Assistance Standby assist, set-up cues, supervision of patient - no hands on  Assistive Device Front wheel walker  Distance Ambulated (ft) 570 ft  Mobility Ambulated with assistance in hallway  Mobility Response Tolerated well  Mobility performed by Mobility specialist  Bed Position Chair  $Mobility charge 1 Mobility   Pt eager for further mobility after PT this am. Pt with 4/10 pain when extending knee to 0 degrees. Pt also stated knee felt more stable than this morning with PT, likely d/t nerve block wearing off. Back in chair with all needs met, eager for stair training with PT before d/c.  Nelta Numbers Mobility Specialist  Phone [phone]

## 2021-11-13 NOTE — Progress Notes (Signed)
Witness wasted 10 mg Oxycodone tablet to waste stericycle with nurse Lennie Hummer RN.

## 2021-11-13 NOTE — Progress Notes (Signed)
Physical Therapy Treatment Patient Details Name: Rickey Cochran MRN: [id number] DOB: 02-Nov-1961 Today's Date: 11/13/2021   History of Present Illness Patient is a 60 y/o male who presents on 11/12/21 for left TKA. PMH includes OA, pes planus, depression, THA.    PT Comments    Patient progressing well this afternoon. Reports feeling a little less stiff than earlier this morning. Session focused on stair training with wife present. Min A needed (HHA) to simulate rail to ascend step. Reports some instances of knee hyperextension thrust when asked to activate quads during stance phase. Provided HEP handout and reviewed exercises. Discussed car transfer. Pt safe to d/c home with support of wife. Will follow if still in the hospital.    Recommendations for follow up therapy are one component of a multi-disciplinary discharge planning process, led by the attending physician.  Recommendations may be updated based on patient status, additional functional criteria and insurance authorization.  Follow Up Recommendations  Follow physician's recommendations for discharge plan and follow up therapies     Assistance Recommended at Discharge PRN  Equipment Recommendations  None recommended by PT    Recommendations for Other Services       Precautions / Restrictions Precautions Precautions: Fall Restrictions Weight Bearing Restrictions: Yes LLE Weight Bearing: Weight bearing as tolerated     Mobility  Bed Mobility Overal bed mobility: Modified Independent             General bed mobility comments: Up in chair upon PT arrival.    Transfers Overall transfer level: Needs assistance Equipment used: Rolling walker (2 wheels) Transfers: Sit to/from Stand Sit to Stand: Modified independent (Device/Increase time)           General transfer comment: Stood from chair x1, no difficulties and good demo of hand placement.    Ambulation/Gait Ambulation/Gait assistance:  Supervision;Modified independent (Device/Increase time) Gait Distance (Feet): 200 Feet Assistive device: Rolling walker (2 wheels) Gait Pattern/deviations: Step-through pattern;Decreased stance time - left;Decreased step length - right;Knee hyperextension - left Gait velocity: decreased Gait velocity interpretation: 1.31 - 2.62 ft/sec, indicative of limited community ambulator   General Gait Details: Slow, steady gait with left knee instability occasionally with knee hyperextension thrust.   Stairs Stairs: Yes Stairs assistance: Min assist Stair Management: Step to pattern;Forwards Number of Stairs: 2 General stair comments: HHA to simulate rail and min A for balance and to ascend stairs. Wife present for training.   Wheelchair Mobility    Modified Rankin (Stroke Patients Only)       Balance Overall balance assessment: Needs assistance Sitting-balance support: Feet supported;No upper extremity supported Sitting balance-Leahy Scale: Good     Standing balance support: During functional activity Standing balance-Leahy Scale: Fair Standing balance comment: Able to stand statically without UE support but needs UE support for dynamic tasks.                            Cognition Arousal/Alertness: Awake/alert Behavior During Therapy: WFL for tasks assessed/performed Overall Cognitive Status: Within Functional Limits for tasks assessed                                          Exercises Total Joint Exercises Ankle Circles/Pumps: AROM;Both;10 reps;Supine Quad Sets: AROM;Both;10 reps;Supine Towel Squeeze: AROM;Both;5 reps;Seated Heel Slides: AROM;Left;5 reps;Seated Hip ABduction/ADduction: AROM;Left;10 reps;Seated Straight Leg Raises: AROM;Both;10 reps;Seated Long Arc  Quad: AROM;Left;5 reps;Seated Goniometric ROM: 5-90 degrees knee AROM    General Comments General comments (skin integrity, edema, etc.): Wife present      Pertinent Vitals/Pain  Pain Assessment: Faces Faces Pain Scale: Hurts little more Pain Location: left knee Pain Descriptors / Indicators: Sore;Operative site guarding Pain Intervention(s): Monitored during session    Home Living Family/patient expects to be discharged to:: Private residence Living Arrangements: Spouse/significant other Available Help at Discharge: Family;Available PRN/intermittently Type of Home: House Home Access: Stairs to enter Entrance Stairs-Rails: None Entrance Stairs-Number of Steps: 2 Alternate Level Stairs-Number of Steps: 1 flight Home Layout: Two level;Able to live on main level with bedroom/bathroom;Laundry or work area in [place]: Conservation officer, nature (2 wheels);BSC/3in1 Additional Comments: rental BSC at home, CPM machine    Prior Function            PT Goals (current goals can now be found in the care plan section) Acute Rehab PT Goals Patient Stated Goal: to be strong enough for my back surgery in 7 weeks PT Goal Formulation: With patient Time For Goal Achievement: 11/27/21 Potential to Achieve Goals: Good Progress towards PT goals: Progressing toward goals    Frequency    7X/week      PT Plan Current plan remains appropriate    Co-evaluation              AM-PAC PT "6 Clicks" Mobility   Outcome Measure  Help needed turning from your back to your side while in a flat bed without using bedrails?: None Help needed moving from lying on your back to sitting on the side of a flat bed without using bedrails?: None Help needed moving to and from a bed to a chair (including a wheelchair)?: None Help needed standing up from a chair using your arms (e.g., wheelchair or bedside chair)?: None Help needed to walk in hospital room?: A Little Help needed climbing 3-5 steps with a railing? : A Little 6 Click Score: 22    End of Session Equipment Utilized During Treatment: Gait belt Activity Tolerance: Patient tolerated treatment well Patient left: in  chair;with call bell/phone within reach;with family/visitor present Nurse Communication: Mobility status PT Visit Diagnosis: Pain;Other abnormalities of gait and mobility (R26.89) Pain - Right/Left: Left Pain - part of body: Knee     Time: 1791-5056 PT Time Calculation (min) (ACUTE ONLY): 18 min  Charges:  $Gait Training: 8-22 mins                      Marisa Severin, PT, DPT Acute Rehabilitation Services Pager [phone] Office [place] 11/13/2021, 12:33 PM

## 2021-11-13 NOTE — Evaluation (Signed)
Physical Therapy Evaluation Patient Details Name: Rickey Cochran MRN: [id number] DOB: 18-Oct-1961 Today's Date: 11/13/2021  History of Present Illness  Patient is a 60 y/o male who presents on 11/12/21 for left TKA. PMH includes OA, pes planus, depression, THA.  Clinical Impression  Patient presents with decreased sensation, pain and post surgical deficits s/p above surgery. Pt independent at baseline and lives with spouse. Today, pt requires Min guard-supervision for all mobility with use of RW. Has some decreased sensation LLE>RLE from nerve block resulting in mild left knee instability with gait training but anticipate this will improve once block wears off. Knee AROM 5-90 degrees. Education re: precautions, zero degree knee foam, HEP, mobility expectations and positioning. Will plan for stair training this afternoon prior to d/c. Will follow acutely to maximize independence and mobility prior to return home.       Recommendations for follow up therapy are one component of a multi-disciplinary discharge planning process, led by the attending physician.  Recommendations may be updated based on patient status, additional functional criteria and insurance authorization.  Follow Up Recommendations Follow physician's recommendations for discharge plan and follow up therapies    Assistance Recommended at Discharge PRN  Functional Status Assessment Patient has had a recent decline in their functional status and demonstrates the ability to make significant improvements in function in a reasonable and predictable amount of time.  Equipment Recommendations  None recommended by PT    Recommendations for Other Services       Precautions / Restrictions Precautions Precautions: Fall Restrictions Weight Bearing Restrictions: Yes LLE Weight Bearing: Weight bearing as tolerated      Mobility  Bed Mobility Overal bed mobility: Modified Independent             General bed mobility  comments: Use of rail, HOB elevated.    Transfers Overall transfer level: Needs assistance Equipment used: Rolling walker (2 wheels) Transfers: Sit to/from Stand Sit to Stand: Min guard           General transfer comment: Min guard for safety. Stood from [employer], cues for hand placement/technique, transferred [employer]	[place] post ambulation.    Ambulation/Gait Ambulation/Gait assistance: Min guard Gait Distance (Feet): 400 Feet Assistive device: Rolling walker (2 wheels) Gait Pattern/deviations: Step-to pattern;Step-through pattern;Decreased stance time - left;Decreased step length - right;Knee hyperextension - left Gait velocity: decreased Gait velocity interpretation: 1.31 - 2.62 ft/sec, indicative of limited community ambulator   General Gait Details: Slow, steady gait with left knee instability occasionally with knee hyperextension thrust, cues for step through gait, to roll RW and for knee extension during stance to activate quads.  Stairs            Wheelchair Mobility    Modified Rankin (Stroke Patients Only)       Balance Overall balance assessment: Needs assistance Sitting-balance support: Feet supported;No upper extremity supported Sitting balance-Leahy Scale: Good     Standing balance support: During functional activity Standing balance-Leahy Scale: Fair Standing balance comment: Able to stand statically without UE support but needs UE support for dynamic tasks.                             Pertinent Vitals/Pain Pain Assessment: Faces Faces Pain Scale: Hurts little more Pain Location: left knee Pain Descriptors / Indicators: Sore;Operative site guarding Pain Intervention(s): Monitored during session;Premedicated before session;Repositioned;Ice applied    Home Living Family/patient expects to be discharged to:: Private residence Living Arrangements: Spouse/significant  other Available Help at Discharge: Family;Available PRN/intermittently Type  of Home: House Home Access: Stairs to enter Entrance Stairs-Rails: None Entrance Stairs-Number of Steps: 2 Alternate Level Stairs-Number of Steps: 1 flight Home Layout: Two level;Able to live on main level with bedroom/bathroom;Laundry or work area in [place]: Conservation officer, nature (2 wheels);BSC/3in1 Additional Comments: rental BSC at home, CPM machine    Prior Function Prior Level of Function : Independent/Modified Independent             Mobility Comments: Independent, driving. Works from [place] Comments: Independent     Hand Dominance   Dominant Hand: Right    Extremity/Trunk Assessment   Upper Extremity Assessment Upper Extremity Assessment: Defer to OT evaluation    Lower Extremity Assessment Lower Extremity Assessment: RLE deficits/detail RLE Sensation: decreased light touch LLE Deficits / Details: Good QS activation LLE Sensation: decreased light touch    Cervical / Trunk Assessment Cervical / Trunk Assessment: Other exceptions Cervical / Trunk Exceptions: getting back surgery in 7 weeks  Communication   Communication: No difficulties  Cognition Arousal/Alertness: Awake/alert Behavior During Therapy: WFL for tasks assessed/performed Overall Cognitive Status: Within Functional Limits for tasks assessed                                          General Comments General comments (skin integrity, edema, etc.): 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; nurse at [employer]    Exercises Total Joint Exercises Ankle Circles/Pumps: AROM;Both;10 reps;Supine Quad Sets: AROM;Both;10 reps;Supine Hip ABduction/ADduction: AROM;Left;10 reps;Seated Goniometric ROM: 5-90 degrees knee AROM   Assessment/Plan    PT Assessment Patient needs continued PT services  PT Problem List Decreased range of motion;Decreased mobility;Decreased strength;Decreased skin integrity;Pain;Impaired sensation;Decreased balance       PT Treatment Interventions  Gait training;DME instruction;Therapeutic activities;Therapeutic exercise;Patient/family education;Balance training;Stair training;Functional mobility training;Modalities    PT Goals (Current goals can be found in the Care Plan section)  Acute Rehab PT Goals Patient Stated Goal: to be strong enough for my back surgery in 7 weeks PT Goal Formulation: With patient Time For Goal Achievement: 11/27/21 Potential to Achieve Goals: Good    Frequency 7X/week   Barriers to discharge        Co-evaluation               AM-PAC PT "6 Clicks" Mobility  Outcome Measure Help needed turning from your back to your side while in a flat bed without using bedrails?: None Help needed moving from lying on your back to sitting on the side of a flat bed without using bedrails?: None Help needed moving to and from a bed to a chair (including a wheelchair)?: A Little Help needed standing up from a chair using your arms (e.g., wheelchair or bedside chair)?: A Little Help needed to walk in hospital room?: A Little Help needed climbing 3-5 steps with a railing? : A Little 6 Click Score: 20    End of Session Equipment Utilized During Treatment: Gait belt Activity Tolerance: Patient tolerated treatment well Patient left: in chair;with call bell/phone within reach;with family/visitor present Nurse Communication: Mobility status PT Visit Diagnosis: Pain;Other abnormalities of gait and mobility (R26.89) Pain - Right/Left: Left Pain - part of body: Knee    Time: 9629-5284 PT Time Calculation (min) (ACUTE ONLY): 39 min   Charges:   PT Evaluation $PT Eval Moderate Complexity: 1 Mod PT Treatments $Gait Training: 8-22  mins $Therapeutic Activity: 8-22 mins        Marisa Severin, PT, DPT Acute Rehabilitation Services Pager [phone] Office [phone]     Lacie Draft 11/13/2021, 9:47 AM

## 2021-11-13 NOTE — Progress Notes (Signed)
Patient Ivs removed. Discharge instructions given to patient. Patient verbalizes understanding. Patient given equipment to go home with. Home meds discussed with patient. Patient discharged

## 2021-11-13 NOTE — Social Work (Signed)
°  Transition of Care [facility]) Screening Note   Patient Details  Name: TOMIE ELKO Date of Birth: 1961-08-18   Transition of Care [facility]) CM/SW Contact:    Emeterio Reeve, LCSW Phone Number: 11/13/2021, 12:36 PM    Transition of Care Department [facility]) has reviewed patient and no TOC needs have been identified at this time. We will continue to monitor patient advancement through interdisciplinary progression rounds. If new patient transition needs arise, please place a TOC consult.

## 2021-11-16 ENCOUNTER — Encounter (HOSPITAL_COMMUNITY): Payer: Self-pay | Admitting: Orthopedic Surgery

## 2021-11-25 NOTE — Discharge Summary (Signed)
Physician Discharge Summary      Patient ID: Rickey Cochran MRN: [id number] DOB/AGE: 61-24-1962 61 y.o.  Admit date: 11/12/2021 Discharge date: 11/13/2021  Admission Diagnoses:  Principal Problem:   OA (osteoarthritis) of knee Active Problems:   S/P TKR (total knee replacement), left   Discharge Diagnoses:  Same  Surgeries: Procedure(s): LEFT TOTAL KNEE ARTHROPLASTY on 11/12/2021   Consultants:   Discharged Condition: Stable  Hospital Course: Rickey Cochran is an 61 y.o. male who was admitted 11/12/2021 with a chief complaint of left knee pain, and found to have a diagnosis of left knee osteoarthritis.  They were brought to the operating room on 11/12/2021 and underwent the above named procedures.  Pt awoke from anesthesia without complication and was transferred to the floor. On POD1, patient's pain was controlled.  He was able to ambulate well with physical therapy.  Denied any chest pain or shortness of breath or any other concerning symptoms.  Vital signs were stable.  He was discharged home on POD 1..  Pt will f/u with Dr. Marlou Sa in clinic in ~2 weeks.   Antibiotics given:  Anti-infectives (From admission, onward)    Start     Dose/Rate Route Frequency Ordered Stop   11/12/21 2045  ceFAZolin (ANCEF) IVPB 2g/100 mL premix        2 g 200 mL/hr over 30 Minutes Intravenous Every 8 hours 11/12/21 1634 11/13/21 0548   11/12/21 1326  vancomycin (VANCOCIN) powder  Status:  Discontinued          As needed 11/12/21 1326 11/12/21 1544   11/12/21 1022  ceFAZolin (ANCEF) 2-4 GM/100ML-% IVPB       Note to Pharmacy: Gustavo Lah J: cabinet override      11/12/21 1022 11/12/21 1245   11/12/21 1015  ceFAZolin (ANCEF) IVPB 2g/100 mL premix        2 g 200 mL/hr over 30 Minutes Intravenous On call to O.R. 11/12/21 1014 11/12/21 1243     .  Recent vital signs:  Vitals:   11/13/21 0638 11/13/21 0742  BP: 109/62 105/71  Pulse: (!) 43 62  Resp:  16  Temp: (!) 97.5 F (36.4 C)  (!) 97.5 F (36.4 C)  SpO2: 97% 98%    Recent laboratory studies:  Results for orders placed or performed during the hospital encounter of 11/09/21  Urine Culture   Specimen: Urine, Clean Catch  Result Value Ref Range   Specimen Description URINE, CLEAN CATCH    Special Requests NONE    Culture      NO GROWTH Performed at [facility], [place] [address], [place], [place] [address]    Report Status 11/10/2021 FINAL   Surgical pcr screen   Specimen: Nasal Mucosa; Nasal Swab  Result Value Ref Range   MRSA, PCR NEGATIVE NEGATIVE   Staphylococcus aureus NEGATIVE NEGATIVE  SARS CORONAVIRUS 2 (TAT 6-24 HRS) Nasopharyngeal Nasopharyngeal Swab   Specimen: Nasopharyngeal Swab  Result Value Ref Range   SARS Coronavirus 2 NEGATIVE NEGATIVE  CBC  Result Value Ref Range   WBC 5.5 4.0 - 10.5 K/uL   RBC 5.20 4.22 - 5.81 MIL/uL   Hemoglobin 16.8 13.0 - 17.0 g/dL   HCT 48.6 39.0 - 52.0 %   MCV 93.5 80.0 - 100.0 fL   MCH 32.3 26.0 - 34.0 pg   MCHC 34.6 30.0 - 36.0 g/dL   RDW 12.0 11.5 - 15.5 %   Platelets 227 150 - 400 K/uL   nRBC 0.0  0.0 - 0.2 %  Basic metabolic panel  Result Value Ref Range   Sodium 141 135 - 145 mmol/L   Potassium 4.4 3.5 - 5.1 mmol/L   Chloride 109 98 - 111 mmol/L   CO2 23 22 - 32 mmol/L   Glucose, Bld 95 70 - 99 mg/dL   BUN 10 6 - 20 mg/dL   Creatinine, Ser 0.86 0.61 - 1.24 mg/dL   Calcium 9.4 8.9 - 10.3 mg/dL   GFR, Estimated >60 >60 mL/min   Anion gap 9 5 - 15  Urinalysis, Routine w reflex microscopic  Result Value Ref Range   Color, Urine YELLOW YELLOW   APPearance CLEAR CLEAR   Specific Gravity, Urine 1.020 1.005 - 1.030   pH 6.0 5.0 - 8.0   Glucose, UA NEGATIVE NEGATIVE mg/dL   Hgb urine dipstick NEGATIVE NEGATIVE   Bilirubin Urine NEGATIVE NEGATIVE   Ketones, ur 15 (A) NEGATIVE mg/dL   Protein, ur NEGATIVE NEGATIVE mg/dL   Nitrite NEGATIVE NEGATIVE   Leukocytes,Ua NEGATIVE NEGATIVE    Discharge Medications:   Allergies as of 11/13/2021    No Known Allergies      Medication List     STOP taking these medications    Fluarix Quadrivalent 0.5 ML injection Generic drug: influenza vac split quadrivalent PF   ibuprofen 200 MG tablet Commonly known as: ADVIL   Pfizer COVID-19 Vac Bivalent injection Generic drug: COVID-19 mRNA bivalent vaccine Therapist, music)       TAKE these medications    alfuzosin 10 MG 24 hr tablet Commonly known as: UROXATRAL TAKE 1 TABLET BY MOUTH ONCE DAILY WITH BREAKFAST   Aspirin Low Dose 81 MG chewable tablet Generic drug: aspirin Chew 1 tablet (81 mg total) by mouth 2 (two) times daily.   celecoxib 100 MG capsule Commonly known as: CELEBREX Take 1 capsule (100 mg total) by mouth 2 (two) times daily.   levothyroxine 88 MCG tablet Commonly known as: SYNTHROID TAKE 1 TABLET BY MOUTH IN THE MORNING ON AN EMPTY STOMACH What changed:  how much to take how to take this when to take this   methocarbamol 500 MG tablet Commonly known as: ROBAXIN Take 1 tablet (500 mg total) by mouth every 6 (six) hours as needed for muscle spasms.   metoprolol succinate 25 MG 24 hr tablet Commonly known as: Toprol XL Take 1 tablet (25 mg total) by mouth daily.   multivitamin with minerals Tabs tablet Take 1 tablet by mouth 3 (three) times daily.   sertraline 100 MG tablet Commonly known as: ZOLOFT TAKE 1 TABLET BY MOUTH EVERY NIGHT AT BEDTIME   traMADol 50 MG tablet Commonly known as: ULTRAM Take 1 tablet (50 mg total) by mouth every 6 (six) hours.        Diagnostic Studies: No results found.  Disposition: Discharge disposition: 01-Home or Self Care       Discharge Instructions     Call MD / Call 911   Complete by: As directed    If you experience chest pain or shortness of breath, CALL 911 and be transported to the hospital emergency room.  If you develope a fever above 101 F, pus (white drainage) or increased drainage or redness at the wound, or calf pain, call your surgeon's office.    Constipation Prevention   Complete by: As directed    Drink plenty of fluids.  Prune juice may be helpful.  You may use a stool softener, such as Colace (over the counter) 100 mg  twice a day.  Use MiraLax (over the counter) for constipation as needed.   Diet - low sodium heart healthy   Complete by: As directed    Discharge instructions   Complete by: As directed    CPM machine 1 hour 3 times a day Work on full extension for 15 minutes after each CPM use Use ice machine after CPM use.  Ice machine for approximately 20 minutes and as needed thereafter Aspirin twice daily for blood clot prevention 81 mg. Okay to shower dressing is waterproof   Increase activity slowly as tolerated   Complete by: As directed    Post-operative opioid taper instructions:   Complete by: As directed    POST-OPERATIVE OPIOID TAPER INSTRUCTIONS: It is important to wean off of your opioid medication as soon as possible. If you do not need pain medication after your surgery it is ok to stop day one. Opioids include: Codeine, Hydrocodone(Norco, Vicodin), Oxycodone(Percocet, oxycontin) and hydromorphone amongst others.  Long term and even short term use of opiods can cause: Increased pain response Dependence Constipation Depression Respiratory depression And more.  Withdrawal symptoms can include Flu like symptoms Nausea, vomiting And more Techniques to manage these symptoms Hydrate well Eat regular healthy meals Stay active Use relaxation techniques(deep breathing, meditating, yoga) Do Not substitute Alcohol to help with tapering If you have been on opioids for less than two weeks and do not have pain than it is ok to stop all together.  Plan to wean off of opioids This plan should start within one week post op of your joint replacement. Maintain the same interval or time between taking each dose and first decrease the dose.  Cut the total daily intake of opioids by one tablet each day Next start to  increase the time between doses. The last dose that should be eliminated is the evening dose.             SignedDonella Stade 11/25/2021, 6:09 PM

## 2021-11-26 ENCOUNTER — Other Ambulatory Visit: Payer: Self-pay

## 2021-11-26 ENCOUNTER — Encounter: Payer: Self-pay | Admitting: Orthopedic Surgery

## 2021-11-26 ENCOUNTER — Ambulatory Visit (INDEPENDENT_AMBULATORY_CARE_PROVIDER_SITE_OTHER): Payer: No Typology Code available for payment source

## 2021-11-26 ENCOUNTER — Ambulatory Visit (INDEPENDENT_AMBULATORY_CARE_PROVIDER_SITE_OTHER): Payer: No Typology Code available for payment source | Admitting: Surgical

## 2021-11-26 DIAGNOSIS — G8929 Other chronic pain: Secondary | ICD-10-CM

## 2021-11-26 DIAGNOSIS — M25562 Pain in left knee: Secondary | ICD-10-CM

## 2021-11-26 NOTE — Progress Notes (Signed)
Post-Op Visit Note   Patient: Rickey Cochran           Date of Birth: [birth year]           MRN: [id number] Visit Date: 11/26/2021 PCP: Janith Lima, MD   Assessment & Plan:  Chief Complaint:  Chief Complaint  Patient presents with   Left Knee - Routine Post Op   Visit Diagnoses:  1. Left knee pain, unspecified chronicity     Plan: BERLIE HATCHEL is a 61 y.o. male who presents s/p left total knee arthroplasty on 11/12/2021.  Patient is doing well and pain is overall controlled.  They are compliant with taking aspirin twice daily for DVT prophylaxis.  Denies any chest pain, SOB, calf pain.  Not taking any oxycodone for pain control and only taking Tylenol, Celebrex, Robaxin, tramadol.  Using CPM machine.  Denies any fevers or night sweats or drainage from the surgical site.  Does note occasional cold spells but nothing that is severe for him. No knee instability or falls. Not using cane or walker at this point.  Feels progressive improvement in pain and swelling since surgery. Does note lateral swelling in particular.  Cannot sleep through the night yet.   On exam, incision is healing well without any evidence of infection or dehiscence.  Range of motion of the operative knee from 0 degrees to 100 degrees.  No calf tenderness.  Negative Homans' sign.  Able to perform straight leg raise without extensor lag.  Steri-Strips redressed today.  Small effusion noted.  Plan is continue with home range of motion exercises and quadricep strengthening exercises.  Refer to physical therapy upstairs to focus on passive and active range of motion, gait training, quadricep strengthening.  He is not ambulating with any cane or walker and overall he is doing exceptionally well following knee replacement.  Answered his questions to his satisfaction.  He will continue the aspirin for 2 more weeks and then stop it as his mobility is almost back to normal.  Recommended he contact the office by phone or  MyChart message if he has any concerns in the meantime.  He is scheduled for lumbar spine surgery by Dr. Ellene Route on 01/04/2022.   Follow-Up Instructions: No follow-ups on file.   Orders:  Orders Placed This Encounter  Procedures   XR Knee 1-2 Views Left   No orders of the defined types were placed in this encounter.   Imaging: No results found.  PMFS History: Patient Active Problem List   Diagnosis Date Noted   OA (osteoarthritis) of knee 11/12/2021   S/P TKR (total knee replacement), left 11/12/2021   NSVT (nonsustained ventricular tachycardia) 05/20/2021   Paroxysmal atrial fibrillation ([place]) 05/20/2021   Pleurodynia 03/23/2021   Multiple fractures of ribs, right side, subsequent encounter for fracture with delayed healing 03/18/2021   Non-toxic multinodular goiter 02/25/2021   Nontoxic single thyroid nodule 02/25/2021   Personal history of malignant neoplasm of thyroid 02/25/2021   Hypothyroidism 02/25/2021   Thyroid cancer ([place]) 02/25/2021   Screen for colon cancer 02/20/2021   Chronic sinus bradycardia 02/17/2021   Primary osteoarthritis of both knees 02/17/2021   Primary osteoarthritis of both feet 02/17/2021   Primary osteoarthritis of left knee 09/23/2020   Routine general medical examination at health care facility 01/29/2020   Premature atrial contraction    BPH associated with nocturia 05/23/2019   Depression with anxiety 05/23/2019   Postoperative hypothyroidism 05/23/2019   Pes planus of both feet 09/11/2015  Past Medical History:  Diagnosis Date   Arthritis    Cancer ([place])    possible thyroid cancer, now s/p partial thyroidectomy    Depression    Dysrhythmia    Hypertension    several years ago was on lisiopril but lost weight and no longer needs   Hypothyroidism    mgd on levothyroxine    Left-sided tinnitus    Premature atrial contraction    was seen on EKG at his PCP approx 8 years ago, denies any symptoms, reports he was a runner before onset of  hip pain in feb 2020   Sleep apnea     No family history on file.  Past Surgical History:  Procedure Laterality Date   COLONOSCOPY     IR THORACENTESIS ASP PLEURAL SPACE W/IMG GUIDE  03/20/2021   JOINT REPLACEMENT Right 04/13/2019   right total hip arthroplasty   THYROID LOBECTOMY Right 05/09/2018   THYROID LOBECTOMY Right 05/09/2018   Procedure: RIGHT THYROID LOBECTOMY;  Surgeon: Ralene Ok, MD;  Location: [place];  Service: General;  Laterality: Right;   TOTAL HIP ARTHROPLASTY Right 04/13/2019   Procedure: RIGHT TOTAL HIP ARTHROPLASTY ANTERIOR APPROACH;  Surgeon: Mcarthur Rossetti, MD;  Location: [facility];  Service: Orthopedics;  Laterality: Right;   TOTAL KNEE ARTHROPLASTY Left 11/12/2021   Procedure: LEFT TOTAL KNEE ARTHROPLASTY;  Surgeon: Meredith Pel, MD;  Location: [place];  Service: Orthopedics;  Laterality: Left;   Social History   Occupational History    Comment: Self employed  Tobacco Use   Smoking status: Former   Smokeless tobacco: Never   Tobacco comments:    QUIT IN 2004  Vaping Use   Vaping Use: Never used  Substance and Sexual Activity   Alcohol use: Not Currently    Alcohol/week: 0.0 standard drinks    Comment: quit   Drug use: Never   Sexual activity: Not on file

## 2021-11-27 ENCOUNTER — Encounter: Payer: Self-pay | Admitting: Rehabilitative and Restorative Service Providers"

## 2021-11-27 ENCOUNTER — Ambulatory Visit: Payer: No Typology Code available for payment source | Admitting: Rehabilitative and Restorative Service Providers"

## 2021-11-27 DIAGNOSIS — R600 Localized edema: Secondary | ICD-10-CM | POA: Diagnosis not present

## 2021-11-27 DIAGNOSIS — M25562 Pain in left knee: Secondary | ICD-10-CM

## 2021-11-27 DIAGNOSIS — R262 Difficulty in walking, not elsewhere classified: Secondary | ICD-10-CM | POA: Diagnosis not present

## 2021-11-27 DIAGNOSIS — G8929 Other chronic pain: Secondary | ICD-10-CM

## 2021-11-27 DIAGNOSIS — M6281 Muscle weakness (generalized): Secondary | ICD-10-CM | POA: Diagnosis not present

## 2021-11-27 NOTE — Patient Instructions (Signed)
Access Code: [email or web address] URL: [email or web address] Date: 11/27/2021 Prepared by: Scot Jun  Exercises Supine Heel Slide - 2 x daily - 7 x weekly - 3 sets - 10 reps - 2 hold Supine Heel Slide with Strap - 2 x daily - 7 x weekly - 3 sets - 10 reps - 5 hold Supine Hamstring Stretch with Strap - 2 x daily - 7 x weekly - 1 sets - 3 reps - 15-30 hold Seated Long Arc Quad - 2 x daily - 7 x weekly - 1 sets - 10 reps - 2 hold Gastroc Stretch on Wall - 2 x daily - 7 x weekly - 1 sets - 5 reps - 30 hold Seated Straight Leg Heel Taps - 2 x daily - 7 x weekly - 3 sets - 10 reps Sit to Stand - 1 x daily - 7 x weekly - 3 sets - 10 reps

## 2021-11-27 NOTE — Therapy (Signed)
[facility] [address] [place], [place], [address] Phone: [phone]   Fax:  [phone]  Physical Therapy Evaluation  Patient Details  Name: Rickey Cochran MRN: [id number] Date of Birth: Jun 08, 1960 Referring Provider (PT): Marlou Sa [place]onna [place], MD   Encounter Date: 11/27/2021   PT End of Session - 11/27/21 1136     Visit Number 1    Number of Visits 20    Date for PT Re-Evaluation 02/05/22    Authorization Type [facility] $40 copay    Progress Note Due on Visit 10    PT Start Time 6161    PT Stop Time 1216    PT Time Calculation (min) 31 min    Activity Tolerance Patient tolerated treatment well    Behavior During Therapy [facility] for tasks assessed/performed             Past Medical History:  Diagnosis Date   Arthritis    Cancer ([place])    possible thyroid cancer, now s/p partial thyroidectomy    Depression    Dysrhythmia    Hypertension    several years ago was on lisiopril but lost weight and no longer needs   Hypothyroidism    mgd on levothyroxine    Left-sided tinnitus    Premature atrial contraction    was seen on EKG at his PCP approx 8 years ago, denies any symptoms, reports he was a runner before onset of hip pain in feb 2020   Sleep apnea     Past Surgical History:  Procedure Laterality Date   COLONOSCOPY     IR THORACENTESIS ASP PLEURAL SPACE W/IMG GUIDE  03/20/2021   JOINT REPLACEMENT Right 04/13/2019   right total hip arthroplasty   THYROID LOBECTOMY Right 05/09/2018   THYROID LOBECTOMY Right 05/09/2018   Procedure: RIGHT THYROID LOBECTOMY;  Surgeon: Ralene Ok, MD;  Location: [place];  Service: General;  Laterality: Right;   TOTAL HIP ARTHROPLASTY Right 04/13/2019   Procedure: RIGHT TOTAL HIP ARTHROPLASTY ANTERIOR APPROACH;  Surgeon: Mcarthur Rossetti, MD;  Location: [facility];  Service: Orthopedics;  Laterality: Right;   TOTAL KNEE ARTHROPLASTY Left 11/12/2021   Procedure: LEFT TOTAL KNEE ARTHROPLASTY;   Surgeon: Meredith Pel, MD;  Location: [place];  Service: Orthopedics;  Laterality: Left;    There were no vitals filed for this visit.    Subjective Assessment - 11/27/21 1144     Subjective Pt. has Lt TKA performed 11/12/2021.  Pt. indicated he has improved some since a few weeks ago.  Pt. indicated sleeping and end of day symptoms noted.  Pt. indicated he has been walking some and does get swelling afterward.  See pain information below.    Pertinent History Lumbar spine surgery 01/04/2022 planned, HTN    Limitations Standing;Walking;House hold activities;Sitting    Patient Stated Goals Reduce pain, riding a bicycle, hiking    Currently in Pain? Yes    Pain Score 2    pain at worst 4/10   Pain Location Knee   thigh (quad achy/tightness)   Pain Orientation Left    Pain Descriptors / Indicators Aching;Sharp    Pain Type Surgical pain    Pain Onset 1 to 4 weeks ago    Pain Frequency Intermittent    Aggravating Factors  end of day/nighttime pain, prolonged walking/standing    Pain Relieving Factors ice, elevation, tylenol    Effect of Pain on Daily Activities Limited in exercise  [facility] PT Assessment - 11/27/21 0001       Assessment   Medical Diagnosis M25.562,G89.29 (ICD-10-CM) - Chronic pain of left knee    Referring Provider (PT) Meredith Pel, MD    Onset Date/Surgical Date 11/12/21    Hand Dominance Right      Precautions   Precautions None      Restrictions   Weight Bearing Restrictions No      Balance Screen   Has the patient fallen in the past 6 months No    Has the patient had a decrease in activity level because of a fear of falling?  No    Is the patient reluctant to leave their home because of a fear of falling?  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, walking/job site Set designer, biking      Cognition   Overall Cognitive Status Within Functional Limits for tasks assessed      Observation/Other Assessments   Observations Localized edema Lt knee    Focus on Therapeutic Outcomes (FOTO)  intake 49%, predicted 64 %      Sensation   Light Touch Appears Intact      Functional Tests   Functional tests Sit to Stand;Single leg stance      Single Leg Stance   Comments Rt 20 seconds, Lt 10 seconds      Sit to Stand   Comments able to perform from 18 inch chair c deviation to Rt leg, no UE required      ROM / Strength   AROM / PROM / Strength Strength;PROM;AROM      AROM   AROM Assessment Site Knee    Right/Left Knee Left;Right    Left Knee Extension -3   in seated LAQ   Left Knee Flexion 110   in supine heel slide     Strength   Strength Assessment Site Hip;Knee;Ankle    Right/Left Hip Right;Left    Right Hip Flexion 5/5    Left Hip Flexion 5/5    Right/Left Knee Right;Left    Right Knee Flexion 5/5    Right Knee Extension 5/5   70, 69 lbs   Left Knee Flexion 5/5    Left Knee Extension 3+/5   24, 23.5 lbs   Right/Left Ankle Right;Left      Ambulation/Gait   Gait Pattern Step-through pattern   Lt knee flexion in stance                       Objective measurements completed on examination: See above findings.       [place] Adult PT Treatment/Exercise - 11/27/21 0001       Exercises   Exercises Other Exercises    Other Exercises  HEP instruction/performance c cues for techniques, handout provided.  Trial set performed of each for comprehension and symptom assessment.  HEP consisting of seated SLR, supine heel slide, seated LAQ, standing gastroc stretch at wall, sit to stand transfers s UE assist                     PT Education - 11/27/21  68     Education Details HEP, POC     Person(s) Educated Patient    Methods Explanation;Demonstration;Verbal cues;Handout    Comprehension Returned demonstration;Verbalized understanding              PT Short Term Goals - 11/27/21 1136       PT SHORT TERM GOAL #1   Title Patient will demonstrate independent use of home exercise program to maintain progress from in clinic treatments.    Time 3    Period Weeks    Status New    Target Date 12/18/21               PT Long Term Goals - 11/27/21 1136       PT LONG TERM GOAL #1   Title Patient will demonstrate/report pain at worst less than or equal to 2/10 to facilitate minimal limitation in daily activity secondary to pain symptoms.    Time 10    Period Weeks    Status New    Target Date 02/05/22      PT LONG TERM GOAL #2   Title Patient will demonstrate independent use of home exercise program to facilitate ability to maintain/progress functional gains from skilled physical therapy services.    Time 10    Period Weeks    Status New    Target Date 02/05/22      PT LONG TERM GOAL #3   Title Pt. will demonstrate FOTO outcome > or = 64 % to indicated reduced disability due to condition.    Time 10    Period Weeks    Status New    Target Date 02/05/22      PT LONG TERM GOAL #4   Title Patient will demonstrate Lt knee AROM 0-120 degrees to facilitate ability to perform transfers, sitting, ambulation, stair navigation s restriction due to mobility.    Time 10    Period Weeks    Status New    Target Date 02/05/22      PT LONG TERM GOAL #5   Title Patient will demonstrate Lt LE MMT 5/5, dynamometry with 15% of Rt throughout to facilitate ability to perform usual standing, walking, stairs at PLOF s limitation due to symptoms.    Time 10    Period Weeks    Status New    Target Date 02/05/22      Additional Long Term Goals   Additional Long Term Goals Yes      PT LONG TERM GOAL #6   Title Pt. will demonstrate ascending/descending stairs s UE assist c  reciprocal gait pattern s symptoms.    Time 10    Period Weeks    Status New    Target Date 02/05/22                    Plan - 11/27/21 1210     Clinical Impression Statement Patient is a 61 y.o. who comes to clinic with complaints of Lt knee pain s/p recent Lt TKA on 11/12/2021 with mobility, strength and movement coordination deficits that impair their ability to perform usual daily and recreational functional activities without increase difficulty/symptoms at this time.  Patient to benefit from skilled PT services to address impairments and limitations to improve to previous level of function without restriction secondary to condition.    Personal Factors and Comorbidities Comorbidity 1    Comorbidities HTN    Examination-Activity Limitations Sit;Sleep;Bed Mobility;Bend;Squat;Stairs;Stand;Carry;Transfers;Locomotion Level;Lift    Examination-Participation Restrictions Community Activity;Shop;Occupation;Other  exercise hiking, biking   Stability/Clinical Decision Making Stable/Uncomplicated    Clinical Decision Making Low    Rehab Potential Good    PT Frequency 2x / week    PT Duration Other (comment)   10 weeks   PT Treatment/Interventions ADLs/Self Care Home Management;Cryotherapy;Electrical Stimulation;Iontophoresis 4mg /ml Dexamethasone;Moist Heat;Balance training;Therapeutic exercise;Functional mobility training;Stair training;Gait training;Ultrasound;Neuromuscular re-education;Patient/family education;Passive range of motion;Joint Manipulations;Dry needling;Vasopneumatic Device;Manual techniques    PT Next Visit Plan Bike, static balance control, quad strengthening progression.    PT Home Exercise Plan [email or web address]    Consulted and Agree with Plan of Care Patient             Patient will benefit from skilled therapeutic intervention in order to improve the following deficits and impairments:  Abnormal gait, Hypomobility, Pain, Increased edema, Decreased activity tolerance,  Decreased strength, Difficulty walking, Decreased mobility, Decreased balance, Decreased range of motion, Improper body mechanics, Impaired perceived functional ability, Impaired flexibility, Decreased coordination  Visit Diagnosis: Chronic pain of left knee  Muscle weakness (generalized)  Difficulty in walking, not elsewhere classified  Localized edema     Problem List Patient Active Problem List   Diagnosis Date Noted   OA (osteoarthritis) of knee 11/12/2021   S/P TKR (total knee replacement), left 11/12/2021   NSVT (nonsustained ventricular tachycardia) 05/20/2021   Paroxysmal atrial fibrillation ([place]) 05/20/2021   Pleurodynia 03/23/2021   Multiple fractures of ribs, right side, subsequent encounter for fracture with delayed healing 03/18/2021   Non-toxic multinodular goiter 02/25/2021   Nontoxic single thyroid nodule 02/25/2021   Personal history of malignant neoplasm of thyroid 02/25/2021   Hypothyroidism 02/25/2021   Thyroid cancer ([place]) 02/25/2021   Screen for colon cancer 02/20/2021   Chronic sinus bradycardia 02/17/2021   Primary osteoarthritis of both knees 02/17/2021   Primary osteoarthritis of both feet 02/17/2021   Primary osteoarthritis of left knee 09/23/2020   Routine general medical examination at health care facility 01/29/2020   Premature atrial contraction    BPH associated with nocturia 05/23/2019   Depression with anxiety 05/23/2019   Postoperative hypothyroidism 05/23/2019   Pes planus of both feet 09/11/2015    Scot Jun, PT, DPT, OCS, ATC 11/27/21  12:26 PM    [facility] [address] [place], [place], [address] Phone: [phone]   Fax:  [phone]  Name: SADAT SLIWA MRN: [id number] Date of Birth: January 03, 1961

## 2021-11-30 ENCOUNTER — Telehealth: Payer: Self-pay | Admitting: Orthopedic Surgery

## 2021-11-30 ENCOUNTER — Telehealth: Payer: Self-pay

## 2021-11-30 NOTE — Telephone Encounter (Signed)
PT calling to get premed needed for an upcoming dental appt (see previous message in chart from [facility]). Pt also did not check out at previous appt on 11/26/21 and wanted to know if he needed to do a follow up prior to his back surg that is sch'd for mid Feb. The best call back number is [phone].

## 2021-11-30 NOTE — Telephone Encounter (Signed)
I called Arbie Cookey to advise on message per [place] Faster CB: [phone]

## 2021-11-30 NOTE — Telephone Encounter (Signed)
Carol (Dental office) called LVM on Triage line. Would like to know if patient needs premeds. I called her back and advised yes he will need premeds. Per Dr. Forbes Cellar Protocol yes-lifetime. They do no provide meds there. Can you please send meds into pharm.. Thanks.

## 2021-11-30 NOTE — Telephone Encounter (Signed)
I can but Dr Marlou Sa prefers patients wait until 2-3 months postop for dental work.  Can you see if he can postpone his appointment?

## 2021-12-01 ENCOUNTER — Encounter: Payer: Self-pay | Admitting: Rehabilitative and Restorative Service Providers"

## 2021-12-01 ENCOUNTER — Other Ambulatory Visit (HOSPITAL_COMMUNITY): Payer: Self-pay

## 2021-12-01 ENCOUNTER — Ambulatory Visit: Payer: No Typology Code available for payment source | Admitting: Rehabilitative and Restorative Service Providers"

## 2021-12-01 ENCOUNTER — Other Ambulatory Visit: Payer: Self-pay

## 2021-12-01 DIAGNOSIS — R600 Localized edema: Secondary | ICD-10-CM | POA: Diagnosis not present

## 2021-12-01 DIAGNOSIS — M25562 Pain in left knee: Secondary | ICD-10-CM | POA: Diagnosis not present

## 2021-12-01 DIAGNOSIS — R262 Difficulty in walking, not elsewhere classified: Secondary | ICD-10-CM

## 2021-12-01 DIAGNOSIS — M6281 Muscle weakness (generalized): Secondary | ICD-10-CM | POA: Diagnosis not present

## 2021-12-01 DIAGNOSIS — G8929 Other chronic pain: Secondary | ICD-10-CM

## 2021-12-01 NOTE — Telephone Encounter (Signed)
Can you make him a follow-up appointment for the first week of February so it will be about 4 weeks out from last visit?  Thanks [employer]

## 2021-12-01 NOTE — Therapy (Addendum)
[facility] [address] [place], [place], [address] Phone: [phone]   Fax:  [phone]  Physical Therapy Treatment  Patient Details  Name: Rickey Cochran MRN: [id number] Date of Birth: December 19, 1960 Referring Provider (PT): Marlou Sa Tonna Corner, MD   Encounter Date: 12/01/2021   PT End of Session - 12/01/21 1543     Visit Number 2    Number of Visits 20    Date for PT Re-Evaluation 02/05/22    Authorization Type [facility] $40 copay    Progress Note Due on Visit 10    PT Start Time 1510    PT Stop Time 1550    PT Time Calculation (min) 40 min    Activity Tolerance Patient tolerated treatment well    Behavior During Therapy [facility] for tasks assessed/performed             Past Medical History:  Diagnosis Date   Arthritis    Cancer ([place])    possible thyroid cancer, now s/p partial thyroidectomy    Depression    Dysrhythmia    Hypertension    several years ago was on lisiopril but lost weight and no longer needs   Hypothyroidism    mgd on levothyroxine    Left-sided tinnitus    Premature atrial contraction    was seen on EKG at his PCP approx 8 years ago, denies any symptoms, reports he was a runner before onset of hip pain in feb 2020   Sleep apnea     Past Surgical History:  Procedure Laterality Date   COLONOSCOPY     IR THORACENTESIS ASP PLEURAL SPACE W/IMG GUIDE  03/20/2021   JOINT REPLACEMENT Right 04/13/2019   right total hip arthroplasty   THYROID LOBECTOMY Right 05/09/2018   THYROID LOBECTOMY Right 05/09/2018   Procedure: RIGHT THYROID LOBECTOMY;  Surgeon: Ralene Ok, MD;  Location: [facility];  Service: General;  Laterality: Right;   TOTAL HIP ARTHROPLASTY Right 04/13/2019   Procedure: RIGHT TOTAL HIP ARTHROPLASTY ANTERIOR APPROACH;  Surgeon: Mcarthur Rossetti, MD;  Location: [facility];  Service: Orthopedics;  Laterality: Right;   TOTAL KNEE ARTHROPLASTY Left 11/12/2021   Procedure: LEFT TOTAL KNEE ARTHROPLASTY;   Surgeon: Meredith Pel, MD;  Location: [facility];  Service: Orthopedics;  Laterality: Left;    There were no vitals filed for this visit.   Subjective Assessment - 12/01/21 1513     Subjective Pt. indicated 2/10 pain upon arrival today.  Pt. indicated a complaint of pain in Lt knee cap area with stand to sit transfer exercise.    Pertinent History Lumbar spine surgery 01/04/2022 planned, HTN    Limitations Standing;Walking;House hold activities;Sitting    Patient Stated Goals Reduce pain, riding a bicycle, hiking    Currently in Pain? Yes    Pain Score 2     Pain Location Knee    Pain Orientation Left    Pain Descriptors / Indicators Aching    Pain Type Surgical pain    Pain Onset 1 to 4 weeks ago    Pain Frequency Intermittent    Aggravating Factors  chair transfer exercise, nighttime    Pain Relieving Factors ice, rest                               [facility] Adult PT Treatment/Exercise - 12/01/21 0001       Neuro Re-ed    Neuro Re-ed Details  SLS on airex  foam 30 sec x 4 bilateral,      Exercises   Exercises Knee/Hip      Knee/Hip Exercises: Stretches   Gastroc Stretch 30 seconds;3 reps   runner stretch Lt leg posterior on incline board     Knee/Hip Exercises: Aerobic   Recumbent Bike Lvl 3 5 mins full revolution      Knee/Hip Exercises: Machines for Strengthening   Cybex Knee Extension double leg up, Lt leg lowering full range 10 lbs 3 x 10    Total Gym Leg Press single leg Lt 50 lbs 2 x 15      Knee/Hip Exercises: Standing   Lateral Step Up Step Height: 4";2 sets;10 reps;Left   mild pain noted in distal quad / patellar insertion proximal to patella   Forward Step Up Step Height: 6";Hand Hold: 0;5 reps;Left                       PT Short Term Goals - 12/01/21 1540       PT SHORT TERM GOAL #1   Title Patient will demonstrate independent use of home exercise program to maintain progress from in clinic treatments.    Time 3     Period Weeks    Status On-going    Target Date 12/18/21               PT Long Term Goals - 11/27/21 1136       PT LONG TERM GOAL #1   Title Patient will demonstrate/report pain at worst less than or equal to 2/10 to facilitate minimal limitation in daily activity secondary to pain symptoms.    Time 10    Period Weeks    Status New    Target Date 02/05/22      PT LONG TERM GOAL #2   Title Patient will demonstrate independent use of home exercise program to facilitate ability to maintain/progress functional gains from skilled physical therapy services.    Time 10    Period Weeks    Status New    Target Date 02/05/22      PT LONG TERM GOAL #3   Title Pt. will demonstrate FOTO outcome > or = 64 % to indicated reduced disability due to condition.    Time 10    Period Weeks    Status New    Target Date 02/05/22      PT LONG TERM GOAL #4   Title Patient will demonstrate Lt knee AROM 0-120 degrees to facilitate ability to perform transfers, sitting, ambulation, stair navigation s restriction due to mobility.    Time 10    Period Weeks    Status New    Target Date 02/05/22      PT LONG TERM GOAL #5   Title Patient will demonstrate Lt LE MMT 5/5, dynamometry with 15% of Rt throughout to facilitate ability to perform usual standing, walking, stairs at PLOF s limitation due to symptoms.    Time 10    Period Weeks    Status New    Target Date 02/05/22      Additional Long Term Goals   Additional Long Term Goals Yes      PT LONG TERM GOAL #6   Title Pt. will demonstrate ascending/descending stairs s UE assist c reciprocal gait pattern s symptoms.    Time 10    Period Weeks    Status New    Target Date 02/05/22  Plan - 12/01/21 1540     Clinical Impression Statement Overall good performance with new activity in clinic.  Main complaint noted was mild/moderate pain in distal quad near insertion to patella in loading such as eccentric step down  and eccentric squat control (noted in HEP transfer exercise). Gave cues to adjust speed of movement to decrease loading while strength improved.  Continued skilled PT services indicated at this time.    Personal Factors and Comorbidities Comorbidity 1    Comorbidities HTN    Examination-Activity Limitations Sit;Sleep;Bed Mobility;Bend;Squat;Stairs;Stand;Carry;Transfers;Locomotion Level;Lift    Examination-Participation Restrictions Community Activity;Shop;Occupation;Other   exercise hiking, biking   Stability/Clinical Decision Making Stable/Uncomplicated    Rehab Potential Good    PT Frequency 2x / week    PT Duration Other (comment)   10 weeks   PT Treatment/Interventions ADLs/Self Care Home Management;Cryotherapy;Electrical Stimulation;Iontophoresis 4mg /ml Dexamethasone;Moist Heat;Balance training;Therapeutic exercise;Functional mobility training;Stair training;Gait training;Ultrasound;Neuromuscular re-education;Patient/family education;Passive range of motion;Joint Manipulations;Dry needling;Vasopneumatic Device;Manual techniques    PT Next Visit Plan Dynamic balance, continued quad strengthening    PT Home Exercise Plan [id number]    Consulted and Agree with Plan of Care Patient             Patient will benefit from skilled therapeutic intervention in order to improve the following deficits and impairments:  Abnormal gait, Hypomobility, Pain, Increased edema, Decreased activity tolerance, Decreased strength, Difficulty walking, Decreased mobility, Decreased balance, Decreased range of motion, Improper body mechanics, Impaired perceived functional ability, Impaired flexibility, Decreased coordination  Visit Diagnosis: Chronic pain of left knee  Muscle weakness (generalized)  Difficulty in walking, not elsewhere classified  Localized edema  Difficulty walking     Problem List Patient Active Problem List   Diagnosis Date Noted   OA (osteoarthritis) of knee 11/12/2021   S/P TKR  (total knee replacement), left 11/12/2021   NSVT (nonsustained ventricular tachycardia) 05/20/2021   Paroxysmal atrial fibrillation ([place]) 05/20/2021   Pleurodynia 03/23/2021   Multiple fractures of ribs, right side, subsequent encounter for fracture with delayed healing 03/18/2021   Non-toxic multinodular goiter 02/25/2021   Nontoxic single thyroid nodule 02/25/2021   Personal history of malignant neoplasm of thyroid 02/25/2021   Hypothyroidism 02/25/2021   Thyroid cancer ([place]) 02/25/2021   Screen for colon cancer 02/20/2021   Chronic sinus bradycardia 02/17/2021   Primary osteoarthritis of both knees 02/17/2021   Primary osteoarthritis of both feet 02/17/2021   Primary osteoarthritis of left knee 09/23/2020   Routine general medical examination at health care facility 01/29/2020   Premature atrial contraction    BPH associated with nocturia 05/23/2019   Depression with anxiety 05/23/2019   Postoperative hypothyroidism 05/23/2019   Pes planus of both feet 09/11/2015    Scot Jun, PT, DPT, OCS, ATC 12/01/21  3:50 PM    [facility] [address] [place], [place], [address] Phone: [phone]   Fax:  [phone]  Name: Rickey Cochran MRN: [id number] Date of Birth: 1961/09/25

## 2021-12-01 NOTE — Telephone Encounter (Signed)
Prefer he wait until mid March

## 2021-12-01 NOTE — Telephone Encounter (Signed)
Patient has been contacted and be scheduled for 12/24/2020 at 8:15AM

## 2021-12-01 NOTE — Telephone Encounter (Signed)
Patient had left TKA preformed on 11/12/2021, when would he be able to have dental cleaning and procedures?

## 2021-12-01 NOTE — Telephone Encounter (Signed)
Contacted patient and made him aware and he understood. Patient would like to know when he should follow back up. He was last seen in the office on 11/26/2021.

## 2021-12-02 ENCOUNTER — Other Ambulatory Visit (HOSPITAL_COMMUNITY): Payer: Self-pay

## 2021-12-03 ENCOUNTER — Other Ambulatory Visit (HOSPITAL_COMMUNITY): Payer: Self-pay

## 2021-12-03 MED ORDER — LEVOTHYROXINE SODIUM 88 MCG PO TABS
ORAL_TABLET | ORAL | 1 refills | Status: DC
Start: 2021-12-03 — End: 2022-03-23
  Filled 2021-12-03: qty 90, 90d supply, fill #0
  Filled 2022-02-26: qty 90, 90d supply, fill #1

## 2021-12-04 ENCOUNTER — Ambulatory Visit: Payer: No Typology Code available for payment source | Admitting: Rehabilitative and Restorative Service Providers"

## 2021-12-04 ENCOUNTER — Other Ambulatory Visit: Payer: Self-pay

## 2021-12-04 ENCOUNTER — Encounter: Payer: Self-pay | Admitting: Rehabilitative and Restorative Service Providers"

## 2021-12-04 DIAGNOSIS — M25562 Pain in left knee: Secondary | ICD-10-CM

## 2021-12-04 DIAGNOSIS — M6281 Muscle weakness (generalized): Secondary | ICD-10-CM

## 2021-12-04 DIAGNOSIS — G8929 Other chronic pain: Secondary | ICD-10-CM

## 2021-12-04 DIAGNOSIS — R600 Localized edema: Secondary | ICD-10-CM | POA: Diagnosis not present

## 2021-12-04 DIAGNOSIS — R262 Difficulty in walking, not elsewhere classified: Secondary | ICD-10-CM

## 2021-12-04 NOTE — Therapy (Addendum)
[facility] [address] [place], [place], [address] Phone: [phone]   Fax:  [phone]  Physical Therapy Treatment  Patient Details  Name: Rickey Cochran MRN: [id number] Date of Birth: May 07, 1961 Referring Provider (PT): Marlou Sa Tonna Corner, MD   Encounter Date: 12/04/2021   PT End of Session - 12/04/21 0757     Visit Number 3    Number of Visits 20    Date for PT Re-Evaluation 02/05/22    Authorization Type [facility] $40 copay    Progress Note Due on Visit 10    PT Start Time 0758    PT Stop Time 0838    PT Time Calculation (min) 40 min    Activity Tolerance Patient tolerated treatment well    Behavior During Therapy [facility] for tasks assessed/performed             Past Medical History:  Diagnosis Date   Arthritis    Cancer ([place])    possible thyroid cancer, now s/p partial thyroidectomy    Depression    Dysrhythmia    Hypertension    several years ago was on lisiopril but lost weight and no longer needs   Hypothyroidism    mgd on levothyroxine    Left-sided tinnitus    Premature atrial contraction    was seen on EKG at his PCP approx 8 years ago, denies any symptoms, reports he was a runner before onset of hip pain in feb 2020   Sleep apnea     Past Surgical History:  Procedure Laterality Date   COLONOSCOPY     IR THORACENTESIS ASP PLEURAL SPACE W/IMG GUIDE  03/20/2021   JOINT REPLACEMENT Right 04/13/2019   right total hip arthroplasty   THYROID LOBECTOMY Right 05/09/2018   THYROID LOBECTOMY Right 05/09/2018   Procedure: RIGHT THYROID LOBECTOMY;  Surgeon: Ralene Ok, MD;  Location: [facility];  Service: General;  Laterality: Right;   TOTAL HIP ARTHROPLASTY Right 04/13/2019   Procedure: RIGHT TOTAL HIP ARTHROPLASTY ANTERIOR APPROACH;  Surgeon: Mcarthur Rossetti, MD;  Location[facility];  Service: Orthopedics;  Laterality: Right;   TOTAL KNEE ARTHROPLASTY Left 11/12/2021   Procedure: LEFT TOTAL KNEE ARTHROPLASTY;   Surgeon: Meredith Pel, MD;  Location: [facility];  Service: Orthopedics;  Laterality: Left;    There were no vitals filed for this visit.   Subjective Assessment - 12/04/21 0759     Subjective Pt. indicated a little pain walking in today.  Pt. indicated nighttime is still trouble.  Pt. indicated he was fatigued in muscle after last visit.    Pertinent History Lumbar spine surgery 01/04/2022 planned, HTN    Limitations Standing;Walking;House hold activities;Sitting    Patient Stated Goals Reduce pain, riding a bicycle, hiking    Currently in Pain? Yes    Pain Score 2    rated as "alittle pain"   Pain Location Knee    Pain Orientation Left    Pain Descriptors / Indicators Aching    Pain Type Surgical pain    Pain Onset 1 to 4 weeks ago    Pain Frequency Intermittent    Aggravating Factors  nighttime    Pain Relieving Factors rest, ice                [facility] PT Assessment - 12/04/21 0001       Assessment   Medical Diagnosis M25.562,G89.29 (ICD-10-CM) - Chronic pain of left knee    Referring Provider (PT) Marlou Sa Tonna Corner, MD  Onset Date/Surgical Date 11/12/21    Hand Dominance Right      Functional Tests   Functional tests Other      Other:   Other/ Comments Able to perform reciprocal gait pattern up/down stairs in clinic s UE assist.      AROM   Left Knee Flexion 120   in supine heel slide                          [facility] Adult PT Treatment/Exercise - 12/04/21 0001       Neuro Re-ed    Neuro Re-ed Details  SLS on airex foam c contralateral cone taps (anterior, anterior/lateral, anterior/medial) x 10 bilateral, lateral stepping 3 cones x 10 bilateral      Knee/Hip Exercises: Stretches   Gastroc Stretch 30 seconds;3 reps;Left   Lt posterior runner stretch on incilne board     Knee/Hip Exercises: Aerobic   Recumbent Bike Lvl 4 5 mins      Knee/Hip Exercises: Machines for Strengthening   Cybex Knee Extension double leg up, Lt leg lowering full  range 15 lbs 3 x 10    Total Gym Leg Press single leg Lt 50 lbs 3 x 15                       PT Short Term Goals - 12/01/21 1540       PT SHORT TERM GOAL #1   Title Patient will demonstrate independent use of home exercise program to maintain progress from in clinic treatments.    Time 3    Period Weeks    Status On-going    Target Date 12/18/21               PT Long Term Goals - 11/27/21 1136       PT LONG TERM GOAL #1   Title Patient will demonstrate/report pain at worst less than or equal to 2/10 to facilitate minimal limitation in daily activity secondary to pain symptoms.    Time 10    Period Weeks    Status New    Target Date 02/05/22      PT LONG TERM GOAL #2   Title Patient will demonstrate independent use of home exercise program to facilitate ability to maintain/progress functional gains from skilled physical therapy services.    Time 10    Period Weeks    Status New    Target Date 02/05/22      PT LONG TERM GOAL #3   Title Pt. will demonstrate FOTO outcome > or = 64 % to indicated reduced disability due to condition.    Time 10    Period Weeks    Status New    Target Date 02/05/22      PT LONG TERM GOAL #4   Title Patient will demonstrate Lt knee AROM 0-120 degrees to facilitate ability to perform transfers, sitting, ambulation, stair navigation s restriction due to mobility.    Time 10    Period Weeks    Status New    Target Date 02/05/22      PT LONG TERM GOAL #5   Title Patient will demonstrate Lt LE MMT 5/5, dynamometry with 15% of Rt throughout to facilitate ability to perform usual standing, walking, stairs at PLOF s limitation due to symptoms.    Time 10    Period Weeks    Status New    Target Date 02/05/22  Additional Long Term Goals   Additional Long Term Goals Yes      PT LONG TERM GOAL #6   Title Pt. will demonstrate ascending/descending stairs s UE assist c reciprocal gait pattern s symptoms.    Time 10     Period Weeks    Status New    Target Date 02/05/22                   Plan - 12/04/21 0825     Clinical Impression Statement Able to perform ascending/descending stairs reciprocal into clinic. Pt. to continue to benefit from progressive strengthening and dynamic balance control intervention.    Personal Factors and Comorbidities Comorbidity 1    Comorbidities HTN    Examination-Activity Limitations Sit;Sleep;Bed Mobility;Bend;Squat;Stairs;Stand;Carry;Transfers;Locomotion Level;Lift    Examination-Participation Restrictions Community Activity;Shop;Occupation;Other   exercise hiking, biking   Stability/Clinical Decision Making Stable/Uncomplicated    Rehab Potential Good    PT Frequency 2x / week    PT Duration Other (comment)   10 weeks   PT Treatment/Interventions ADLs/Self Care Home Management;Cryotherapy;Electrical Stimulation;Iontophoresis 4mg /ml Dexamethasone;Moist Heat;Balance training;Therapeutic exercise;Functional mobility training;Stair training;Gait training;Ultrasound;Neuromuscular re-education;Patient/family education;Passive range of motion;Joint Manipulations;Dry needling;Vasopneumatic Device;Manual techniques    PT Next Visit Plan Dynamic balance, continued quad strengthening on machines, squats    PT Home Exercise Plan [id number]    Consulted and Agree with Plan of Care Patient             Patient will benefit from skilled therapeutic intervention in order to improve the following deficits and impairments:  Abnormal gait, Hypomobility, Pain, Increased edema, Decreased activity tolerance, Decreased strength, Difficulty walking, Decreased mobility, Decreased balance, Decreased range of motion, Improper body mechanics, Impaired perceived functional ability, Impaired flexibility, Decreased coordination  Visit Diagnosis: Chronic pain of left knee  Muscle weakness (generalized)  Difficulty in walking, not elsewhere classified  Localized edema     Problem  List Patient Active Problem List   Diagnosis Date Noted   OA (osteoarthritis) of knee 11/12/2021   S/P TKR (total knee replacement), left 11/12/2021   NSVT (nonsustained ventricular tachycardia) 05/20/2021   Paroxysmal atrial fibrillation ([place]) 05/20/2021   Pleurodynia 03/23/2021   Multiple fractures of ribs, right side, subsequent encounter for fracture with delayed healing 03/18/2021   Non-toxic multinodular goiter 02/25/2021   Nontoxic single thyroid nodule 02/25/2021   Personal history of malignant neoplasm of thyroid 02/25/2021   Hypothyroidism 02/25/2021   Thyroid cancer ([place]) 02/25/2021   Screen for colon cancer 02/20/2021   Chronic sinus bradycardia 02/17/2021   Primary osteoarthritis of both knees 02/17/2021   Primary osteoarthritis of both feet 02/17/2021   Primary osteoarthritis of left knee 09/23/2020   Routine general medical examination at health care facility 01/29/2020   Premature atrial contraction    BPH associated with nocturia 05/23/2019   Depression with anxiety 05/23/2019   Postoperative hypothyroidism 05/23/2019   Pes planus of both feet 09/11/2015    Scot Jun, PT, DPT, OCS, ATC 12/04/21  8:39 AM    [facility] [address] [place], [place], [address] Phone: [phone]   Fax:  [phone]  Name: SYLVAN SOOKDEO MRN: [id number] Date of Birth: 1961/01/13

## 2021-12-07 ENCOUNTER — Encounter: Payer: Self-pay | Admitting: Rehabilitative and Restorative Service Providers"

## 2021-12-07 ENCOUNTER — Ambulatory Visit: Payer: No Typology Code available for payment source | Admitting: Rehabilitative and Restorative Service Providers"

## 2021-12-07 ENCOUNTER — Other Ambulatory Visit: Payer: Self-pay

## 2021-12-07 DIAGNOSIS — R600 Localized edema: Secondary | ICD-10-CM

## 2021-12-07 DIAGNOSIS — M6281 Muscle weakness (generalized): Secondary | ICD-10-CM | POA: Diagnosis not present

## 2021-12-07 DIAGNOSIS — M25562 Pain in left knee: Secondary | ICD-10-CM | POA: Diagnosis not present

## 2021-12-07 DIAGNOSIS — R262 Difficulty in walking, not elsewhere classified: Secondary | ICD-10-CM

## 2021-12-07 DIAGNOSIS — G8929 Other chronic pain: Secondary | ICD-10-CM

## 2021-12-07 NOTE — Therapy (Signed)
[facility] [address] [place], [place], [address] Phone: [phone]   Fax:  [phone]  Physical Therapy Treatment  Patient Details  Name: Rickey Cochran MRN: [id number] Date of Birth: [birth year] Referring Provider (PT): Marlou Sa Tonna Corner, MD   Encounter Date: 12/07/2021   PT End of Session - 12/07/21 1324     Visit Number 4    Number of Visits 20    Date for PT Re-Evaluation 02/05/22    Authorization Type [facility] $40 copay    Progress Note Due on Visit 10    PT Start Time 1300    PT Stop Time 1339    PT Time Calculation (min) 39 min    Activity Tolerance Patient tolerated treatment well    Behavior During Therapy [facility] for tasks assessed/performed             Past Medical History:  Diagnosis Date   Arthritis    Cancer ([place])    possible thyroid cancer, now s/p partial thyroidectomy    Depression    Dysrhythmia    Hypertension    several years ago was on lisiopril but lost weight and no longer needs   Hypothyroidism    mgd on levothyroxine    Left-sided tinnitus    Premature atrial contraction    was seen on EKG at his PCP approx 8 years ago, denies any symptoms, reports he was a runner before onset of hip pain in feb 2020   Sleep apnea     Past Surgical History:  Procedure Laterality Date   COLONOSCOPY     IR THORACENTESIS ASP PLEURAL SPACE W/IMG GUIDE  03/20/2021   JOINT REPLACEMENT Right 04/13/2019   right total hip arthroplasty   THYROID LOBECTOMY Right 05/09/2018   THYROID LOBECTOMY Right 05/09/2018   Procedure: RIGHT THYROID LOBECTOMY;  Surgeon: Ralene Ok, MD;  Location: [place];  Service: General;  Laterality: Right;   TOTAL HIP ARTHROPLASTY Right 04/13/2019   Procedure: RIGHT TOTAL HIP ARTHROPLASTY ANTERIOR APPROACH;  Surgeon: Mcarthur Rossetti, MD;  Location: [facility];  Service: Orthopedics;  Laterality: Right;   TOTAL KNEE ARTHROPLASTY Left 11/12/2021   Procedure: LEFT TOTAL KNEE ARTHROPLASTY;   Surgeon: Meredith Pel, MD;  Location: [place];  Service: Orthopedics;  Laterality: Left;    There were no vitals filed for this visit.   Subjective Assessment - 12/07/21 1301     Subjective Pt. indicated 2/10 pain/stiffness today.  Pt. walked 3 plus miles on trail yesterday.    Pertinent History Lumbar spine surgery 01/04/2022 planned, HTN    Limitations Standing;Walking;House hold activities;Sitting    Patient Stated Goals Reduce pain, riding a bicycle, hiking    Pain Score 2     Pain Location Knee    Pain Orientation Left    Pain Descriptors / Indicators Tightness;Sore    Pain Type Surgical pain    Pain Onset 1 to 4 weeks ago    Pain Frequency Intermittent    Aggravating Factors  upon waking this morning    Pain Relieving Factors rest, ice                               [facility] Adult PT Treatment/Exercise - 12/07/21 0001       Neuro Re-ed    Neuro Re-ed Details  fitter rocker board fwd/back 30 x,      Knee/Hip Exercises: Aerobic   Recumbent Bike Lvl 4 8  mins      Knee/Hip Exercises: Machines for Strengthening   Cybex Knee Extension double leg up, Lt leg lowering full range 15 lbs 3 x 10    Cybex Knee Flexion Lt leg 20 lbs 3 x 10    Total Gym Leg Press single leg Lt 56 lbs 3 x 10      Knee/Hip Exercises: Standing   Other Standing Knee Exercises step up and over down on Lt leg 6 inch 20x      Knee/Hip Exercises: Seated   Other Seated Knee/Hip Exercises seated LAQ pause in extension/flexion for mobility x 15      Knee/Hip Exercises: Supine   Other Supine Knee/Hip Exercises supine LAQ in 90 deg hip flexion x 10 c cues for home use                       PT Short Term Goals - 12/01/21 1540       PT SHORT TERM GOAL #1   Title Patient will demonstrate independent use of home exercise program to maintain progress from in clinic treatments.    Time 3    Period Weeks    Status On-going    Target Date 12/18/21               PT  Long Term Goals - 11/27/21 1136       PT LONG TERM GOAL #1   Title Patient will demonstrate/report pain at worst less than or equal to 2/10 to facilitate minimal limitation in daily activity secondary to pain symptoms.    Time 10    Period Weeks    Status New    Target Date 02/05/22      PT LONG TERM GOAL #2   Title Patient will demonstrate independent use of home exercise program to facilitate ability to maintain/progress functional gains from skilled physical therapy services.    Time 10    Period Weeks    Status New    Target Date 02/05/22      PT LONG TERM GOAL #3   Title Pt. will demonstrate FOTO outcome > or = 64 % to indicated reduced disability due to condition.    Time 10    Period Weeks    Status New    Target Date 02/05/22      PT LONG TERM GOAL #4   Title Patient will demonstrate Lt knee AROM 0-120 degrees to facilitate ability to perform transfers, sitting, ambulation, stair navigation s restriction due to mobility.    Time 10    Period Weeks    Status New    Target Date 02/05/22      PT LONG TERM GOAL #5   Title Patient will demonstrate Lt LE MMT 5/5, dynamometry with 15% of Rt throughout to facilitate ability to perform usual standing, walking, stairs at PLOF s limitation due to symptoms.    Time 10    Period Weeks    Status New    Target Date 02/05/22      Additional Long Term Goals   Additional Long Term Goals Yes      PT LONG TERM GOAL #6   Title Pt. will demonstrate ascending/descending stairs s UE assist c reciprocal gait pattern s symptoms.    Time 10    Period Weeks    Status New    Target Date 02/05/22  Plan - 12/07/21 1324     Clinical Impression Statement Stiffness complaints more noted today than previous c mild antalgic gait increase due to WB pressure on Lt.    Personal Factors and Comorbidities Comorbidity 1    Comorbidities HTN    Examination-Activity Limitations Sit;Sleep;Bed  Mobility;Bend;Squat;Stairs;Stand;Carry;Transfers;Locomotion Level;Lift    Examination-Participation Restrictions Community Activity;Shop;Occupation;Other   exercise hiking, biking   Stability/Clinical Decision Making Stable/Uncomplicated    Rehab Potential Good    PT Frequency 2x / week    PT Duration Other (comment)   10 weeks   PT Treatment/Interventions ADLs/Self Care Home Management;Cryotherapy;Electrical Stimulation;Iontophoresis 4mg /ml Dexamethasone;Moist Heat;Balance training;Therapeutic exercise;Functional mobility training;Stair training;Gait training;Ultrasound;Neuromuscular re-education;Patient/family education;Passive range of motion;Joint Manipulations;Dry needling;Vasopneumatic Device;Manual techniques    PT Next Visit Plan Dynamic balance, continued quad strengthening on machines, squats    PT Home Exercise Plan [id number]    Consulted and Agree with Plan of Care Patient             Patient will benefit from skilled therapeutic intervention in order to improve the following deficits and impairments:  Abnormal gait, Hypomobility, Pain, Increased edema, Decreased activity tolerance, Decreased strength, Difficulty walking, Decreased mobility, Decreased balance, Decreased range of motion, Improper body mechanics, Impaired perceived functional ability, Impaired flexibility, Decreased coordination  Visit Diagnosis: Chronic pain of left knee  Muscle weakness (generalized)  Difficulty in walking, not elsewhere classified  Localized edema  Difficulty walking     Problem List Patient Active Problem List   Diagnosis Date Noted   OA (osteoarthritis) of knee 11/12/2021   S/P TKR (total knee replacement), left 11/12/2021   NSVT (nonsustained ventricular tachycardia) 05/20/2021   Paroxysmal atrial fibrillation ([place]) 05/20/2021   Pleurodynia 03/23/2021   Multiple fractures of ribs, right side, subsequent encounter for fracture with delayed healing 03/18/2021   Non-toxic  multinodular goiter 02/25/2021   Nontoxic single thyroid nodule 02/25/2021   Personal history of malignant neoplasm of thyroid 02/25/2021   Hypothyroidism 02/25/2021   Thyroid cancer ([place]) 02/25/2021   Screen for colon cancer 02/20/2021   Chronic sinus bradycardia 02/17/2021   Primary osteoarthritis of both knees 02/17/2021   Primary osteoarthritis of both feet 02/17/2021   Primary osteoarthritis of left knee 09/23/2020   Routine general medical examination at health care facility 01/29/2020   Premature atrial contraction    BPH associated with nocturia 05/23/2019   Depression with anxiety 05/23/2019   Postoperative hypothyroidism 05/23/2019   Pes planus of both feet 09/11/2015    Scot Jun, PT, DPT, OCS, ATC 12/07/21  1:42 PM     [facility] [address] [place], [place], [address] Phone: [phone]   Fax:  [phone]  Name: Rickey Cochran MRN: [id number] Date of Birth: [birth year]

## 2021-12-09 ENCOUNTER — Other Ambulatory Visit: Payer: Self-pay

## 2021-12-09 ENCOUNTER — Other Ambulatory Visit (HOSPITAL_COMMUNITY): Payer: Self-pay

## 2021-12-09 ENCOUNTER — Encounter: Payer: Self-pay | Admitting: Rehabilitative and Restorative Service Providers"

## 2021-12-09 ENCOUNTER — Ambulatory Visit: Payer: No Typology Code available for payment source | Admitting: Rehabilitative and Restorative Service Providers"

## 2021-12-09 DIAGNOSIS — G8929 Other chronic pain: Secondary | ICD-10-CM

## 2021-12-09 DIAGNOSIS — M25562 Pain in left knee: Secondary | ICD-10-CM

## 2021-12-09 DIAGNOSIS — M6281 Muscle weakness (generalized): Secondary | ICD-10-CM | POA: Diagnosis not present

## 2021-12-09 DIAGNOSIS — R262 Difficulty in walking, not elsewhere classified: Secondary | ICD-10-CM

## 2021-12-09 DIAGNOSIS — R600 Localized edema: Secondary | ICD-10-CM | POA: Diagnosis not present

## 2021-12-09 MED ORDER — LEVOTHYROXINE SODIUM 88 MCG PO TABS: ORAL_TABLET | ORAL | 1 refills | Status: DC

## 2021-12-09 NOTE — Therapy (Signed)
[facility] [address] [place], [place], [address] Phone: [phone]   Fax:  [phone]  Physical Therapy Treatment  Patient Details  Name: Rickey Cochran MRN: [id number] Date of Birth: 31-Aug-1961 Referring Provider (PT): Marlou Sa Tonna Corner, MD   Encounter Date: 12/09/2021   PT End of Session - 12/09/21 1142     Visit Number 5    Number of Visits 20    Date for PT Re-Evaluation 02/05/22    Authorization Type [facility] $40 copay    Progress Note Due on Visit 10    PT Start Time 0240    PT Stop Time 1221    PT Time Calculation (min) 39 min    Activity Tolerance Patient tolerated treatment well    Behavior During Therapy [facility] for tasks assessed/performed             Past Medical History:  Diagnosis Date   Arthritis    Cancer ([place])    possible thyroid cancer, now s/p partial thyroidectomy    Depression    Dysrhythmia    Hypertension    several years ago was on lisiopril but lost weight and no longer needs   Hypothyroidism    mgd on levothyroxine    Left-sided tinnitus    Premature atrial contraction    was seen on EKG at his PCP approx 8 years ago, denies any symptoms, reports he was a runner before onset of hip pain in feb 2020   Sleep apnea     Past Surgical History:  Procedure Laterality Date   COLONOSCOPY     IR THORACENTESIS ASP PLEURAL SPACE W/IMG GUIDE  03/20/2021   JOINT REPLACEMENT Right 04/13/2019   right total hip arthroplasty   THYROID LOBECTOMY Right 05/09/2018   THYROID LOBECTOMY Right 05/09/2018   Procedure: RIGHT THYROID LOBECTOMY;  Surgeon: Ralene Ok, MD;  Location: [place];  Service: General;  Laterality: Right;   TOTAL HIP ARTHROPLASTY Right 04/13/2019   Procedure: RIGHT TOTAL HIP ARTHROPLASTY ANTERIOR APPROACH;  Surgeon: Mcarthur Rossetti, MD;  Location[facility];  Service: Orthopedics;  Laterality: Right;   TOTAL KNEE ARTHROPLASTY Left 11/12/2021   Procedure: LEFT TOTAL KNEE ARTHROPLASTY;   Surgeon: Meredith Pel, MD;  Location: [place];  Service: Orthopedics;  Laterality: Left;    There were no vitals filed for this visit.   Subjective Assessment - 12/09/21 1145     Subjective Pt. indicated not feeling normal but didn't report specific pain.    Pertinent History Lumbar spine surgery 01/04/2022 planned, HTN    Limitations Standing;Walking;House hold activities;Sitting    Patient Stated Goals Reduce pain, riding a bicycle, hiking    Currently in Pain? No/denies    Pain Score 0-No pain    Pain Onset 1 to 4 weeks ago                               [facility] Adult PT Treatment/Exercise - 12/09/21 0001       Neuro Re-ed    Neuro Re-ed Details  lateral stepping 3 cones x 10 bilateral, step up on foam c single leg hold 2-3 seconds x 15 bilateral      Knee/Hip Exercises: Aerobic   Recumbent Bike Lvl 5 10 mins      Knee/Hip Exercises: Machines for Strengthening   Cybex Knee Extension double leg up, Lt leg lowering full range 15 lbs 3 x 15    Cybex Knee  Flexion Lt leg 20 lbs 3 x 10    Total Gym Leg Press single leg Lt 62 lbs 3 x 15                       PT Short Term Goals - 12/09/21 1148       PT SHORT TERM GOAL #1   Title Patient will demonstrate independent use of home exercise program to maintain progress from in clinic treatments.    Time 3    Period Weeks    Status Achieved    Target Date 12/18/21               PT Long Term Goals - 11/27/21 1136       PT LONG TERM GOAL #1   Title Patient will demonstrate/report pain at worst less than or equal to 2/10 to facilitate minimal limitation in daily activity secondary to pain symptoms.    Time 10    Period Weeks    Status New    Target Date 02/05/22      PT LONG TERM GOAL #2   Title Patient will demonstrate independent use of home exercise program to facilitate ability to maintain/progress functional gains from skilled physical therapy services.    Time 10    Period  Weeks    Status New    Target Date 02/05/22      PT LONG TERM GOAL #3   Title Pt. will demonstrate FOTO outcome > or = 64 % to indicated reduced disability due to condition.    Time 10    Period Weeks    Status New    Target Date 02/05/22      PT LONG TERM GOAL #4   Title Patient will demonstrate Lt knee AROM 0-120 degrees to facilitate ability to perform transfers, sitting, ambulation, stair navigation s restriction due to mobility.    Time 10    Period Weeks    Status New    Target Date 02/05/22      PT LONG TERM GOAL #5   Title Patient will demonstrate Lt LE MMT 5/5, dynamometry with 15% of Rt throughout to facilitate ability to perform usual standing, walking, stairs at PLOF s limitation due to symptoms.    Time 10    Period Weeks    Status New    Target Date 02/05/22      Additional Long Term Goals   Additional Long Term Goals Yes      PT LONG TERM GOAL #6   Title Pt. will demonstrate ascending/descending stairs s UE assist c reciprocal gait pattern s symptoms.    Time 10    Period Weeks    Status New    Target Date 02/05/22                   Plan - 12/09/21 1148     Clinical Impression Statement Pt. continued to demonstrate good overall tolerance to progression on interventions.  Focus remained on functional strength and balance control improvements to improve ambulation, overall functional mobility.    Personal Factors and Comorbidities Comorbidity 1    Comorbidities HTN    Examination-Activity Limitations Sit;Sleep;Bed Mobility;Bend;Squat;Stairs;Stand;Carry;Transfers;Locomotion Level;Lift    Examination-Participation Restrictions Community Activity;Shop;Occupation;Other   exercise hiking, biking   Stability/Clinical Decision Making Stable/Uncomplicated    Rehab Potential Good    PT Frequency 2x / week    PT Duration Other (comment)   10 weeks   PT Treatment/Interventions ADLs/Self Care  Home Management;Cryotherapy;Electrical Stimulation;Iontophoresis  4mg /ml Dexamethasone;Moist Heat;Balance training;Therapeutic exercise;Functional mobility training;Stair training;Gait training;Ultrasound;Neuromuscular re-education;Patient/family education;Passive range of motion;Joint Manipulations;Dry needling;Vasopneumatic Device;Manual techniques    PT Next Visit Plan Dynamic balance, continued quad strengthening progression.    PT Home Exercise Plan [id number]    Consulted and Agree with Plan of Care Patient             Patient will benefit from skilled therapeutic intervention in order to improve the following deficits and impairments:  Abnormal gait, Hypomobility, Pain, Increased edema, Decreased activity tolerance, Decreased strength, Difficulty walking, Decreased mobility, Decreased balance, Decreased range of motion, Improper body mechanics, Impaired perceived functional ability, Impaired flexibility, Decreased coordination  Visit Diagnosis: Chronic pain of left knee  Muscle weakness (generalized)  Difficulty in walking, not elsewhere classified  Localized edema     Problem List Patient Active Problem List   Diagnosis Date Noted   OA (osteoarthritis) of knee 11/12/2021   S/P TKR (total knee replacement), left 11/12/2021   NSVT (nonsustained ventricular tachycardia) 05/20/2021   Paroxysmal atrial fibrillation ([place]) 05/20/2021   Pleurodynia 03/23/2021   Multiple fractures of ribs, right side, subsequent encounter for fracture with delayed healing 03/18/2021   Non-toxic multinodular goiter 02/25/2021   Nontoxic single thyroid nodule 02/25/2021   Personal history of malignant neoplasm of thyroid 02/25/2021   Hypothyroidism 02/25/2021   Thyroid cancer ([place]) 02/25/2021   Screen for colon cancer 02/20/2021   Chronic sinus bradycardia 02/17/2021   Primary osteoarthritis of both knees 02/17/2021   Primary osteoarthritis of both feet 02/17/2021   Primary osteoarthritis of left knee 09/23/2020   Routine general medical examination at health  care facility 01/29/2020   Premature atrial contraction    BPH associated with nocturia 05/23/2019   Depression with anxiety 05/23/2019   Postoperative hypothyroidism 05/23/2019   Pes planus of both feet 09/11/2015    Scot Jun, PT, DPT, OCS, ATC 12/09/21  12:22 PM    [facility] [address] [place], [place], [address] Phone: [phone]   Fax:  [phone]  Name: Rickey Cochran MRN: [id number] Date of Birth: 1961/10/16

## 2021-12-10 ENCOUNTER — Other Ambulatory Visit (HOSPITAL_COMMUNITY): Payer: Self-pay

## 2021-12-10 ENCOUNTER — Other Ambulatory Visit: Payer: Self-pay | Admitting: Orthopedic Surgery

## 2021-12-10 MED ORDER — TRAMADOL HCL 50 MG PO TABS
50.0000 mg | ORAL_TABLET | Freq: Four times a day (QID) | ORAL | 0 refills | Status: DC
  Filled 2021-12-10: qty 30, 8d supply, fill #0

## 2021-12-11 ENCOUNTER — Other Ambulatory Visit (HOSPITAL_COMMUNITY): Payer: Self-pay

## 2021-12-11 ENCOUNTER — Telehealth: Payer: Self-pay | Admitting: Orthopedic Surgery

## 2021-12-11 NOTE — Telephone Encounter (Signed)
Already refilled yesterday at [facility]

## 2021-12-11 NOTE — Telephone Encounter (Signed)
IC advised.  

## 2021-12-11 NOTE — Telephone Encounter (Signed)
Pt wife called. Wondering if pt can get refill on tramadol. She thought he could make it without but he's just in too much pain.   CB [phone]

## 2021-12-15 ENCOUNTER — Encounter: Payer: Self-pay | Admitting: Rehabilitative and Restorative Service Providers"

## 2021-12-15 ENCOUNTER — Other Ambulatory Visit: Payer: Self-pay

## 2021-12-15 ENCOUNTER — Ambulatory Visit: Payer: No Typology Code available for payment source | Admitting: Rehabilitative and Restorative Service Providers"

## 2021-12-15 DIAGNOSIS — R262 Difficulty in walking, not elsewhere classified: Secondary | ICD-10-CM | POA: Diagnosis not present

## 2021-12-15 DIAGNOSIS — G8929 Other chronic pain: Secondary | ICD-10-CM

## 2021-12-15 DIAGNOSIS — M6281 Muscle weakness (generalized): Secondary | ICD-10-CM | POA: Diagnosis not present

## 2021-12-15 DIAGNOSIS — M25562 Pain in left knee: Secondary | ICD-10-CM | POA: Diagnosis not present

## 2021-12-15 DIAGNOSIS — R600 Localized edema: Secondary | ICD-10-CM | POA: Diagnosis not present

## 2021-12-15 NOTE — Therapy (Signed)
[facility] [address] [place], [place], [address] Phone: [phone]   Fax:  [phone]  Physical Therapy Treatment  Patient Details  Name: Rickey Cochran MRN: [id number] Date of Birth: January 05, 1961 Referring Provider (PT): Marlou Sa Tonna Corner, MD   Encounter Date: 12/15/2021   PT End of Session - 12/15/21 1600     Visit Number 6    Number of Visits 20    Date for PT Re-Evaluation 02/05/22    Authorization Type [facility] $40 copay    Progress Note Due on Visit 10    PT Start Time 9371    PT Stop Time 1635    PT Time Calculation (min) 39 min    Activity Tolerance Patient tolerated treatment well    Behavior During Therapy [facility] for tasks assessed/performed             Past Medical History:  Diagnosis Date   Arthritis    Cancer ([place])    possible thyroid cancer, now s/p partial thyroidectomy    Depression    Dysrhythmia    Hypertension    several years ago was on lisiopril but lost weight and no longer needs   Hypothyroidism    mgd on levothyroxine    Left-sided tinnitus    Premature atrial contraction    was seen on EKG at his PCP approx 8 years ago, denies any symptoms, reports he was a runner before onset of hip pain in feb 2020   Sleep apnea     Past Surgical History:  Procedure Laterality Date   COLONOSCOPY     IR THORACENTESIS ASP PLEURAL SPACE W/IMG GUIDE  03/20/2021   JOINT REPLACEMENT Right 04/13/2019   right total hip arthroplasty   THYROID LOBECTOMY Right 05/09/2018   THYROID LOBECTOMY Right 05/09/2018   Procedure: RIGHT THYROID LOBECTOMY;  Surgeon: Ralene Ok, MD;  Location: [place];  Service: General;  Laterality: Right;   TOTAL HIP ARTHROPLASTY Right 04/13/2019   Procedure: RIGHT TOTAL HIP ARTHROPLASTY ANTERIOR APPROACH;  Surgeon: Mcarthur Rossetti, MD;  Location: [facility];  Service: Orthopedics;  Laterality: Right;   TOTAL KNEE ARTHROPLASTY Left 11/12/2021   Procedure: LEFT TOTAL KNEE ARTHROPLASTY;   Surgeon: Meredith Pel, MD;  Location: [place];  Service: Orthopedics;  Laterality: Left;    There were no vitals filed for this visit.   Subjective Assessment - 12/15/21 1559     Subjective Pt. indicated no pain today just stiffness here and there.  Pt. indicated he has been working on stair lowering.  Pt. indicated some complaints in distal Lt quad c transfers one day but it was better a few days later.    Pertinent History Lumbar spine surgery 01/04/2022 planned, HTN    Limitations Standing;Walking;House hold activities;Sitting    Patient Stated Goals Reduce pain, riding a bicycle, hiking    Currently in Pain? No/denies    Pain Score 0-No pain    Pain Onset 1 to 4 weeks ago                [facility] PT Assessment - 12/15/21 0001       Assessment   Medical Diagnosis M25.562,G89.29 (ICD-10-CM) - Chronic pain of left knee    Referring Provider (PT) Marlou Sa Tonna Corner, MD    Onset Date/Surgical Date 11/12/21    Hand Dominance Right      Single Leg Stance   Comments Lt SLS 25 seconds  [place] Adult PT Treatment/Exercise - 12/15/21 0001       Neuro Re-ed    Neuro Re-ed Details  single leg stance c vector reaching fwd/lat/reverse x 10 each bilateral, single leg stance c contralateral leg cone touches (anterior, anterior/medial, anterior/lateral) x 10 each bilateral      Knee/Hip Exercises: Aerobic   Other Aerobic UBE LE only lvl 5.5 6 mins      Knee/Hip Exercises: Machines for Strengthening   Cybex Knee Extension double leg up, Lt leg lowering full range 15 lbs 3 x 15    Total Gym Leg Press single leg Lt 68 lbs 3 x 10      Knee/Hip Exercises: Standing   Forward Step Up Step Height: 6";Hand Hold: 1;2 sets;15 reps;Left   blue band TKE     Manual Therapy   Manual therapy comments percussive device to distal and middle 1/3 of Lt quad in sitting                       PT Short Term Goals - 12/09/21 1148       PT  SHORT TERM GOAL #1   Title Patient will demonstrate independent use of home exercise program to maintain progress from in clinic treatments.    Time 3    Period Weeks    Status Achieved    Target Date 12/18/21               PT Long Term Goals - 11/27/21 1136       PT LONG TERM GOAL #1   Title Patient will demonstrate/report pain at worst less than or equal to 2/10 to facilitate minimal limitation in daily activity secondary to pain symptoms.    Time 10    Period Weeks    Status New    Target Date 02/05/22      PT LONG TERM GOAL #2   Title Patient will demonstrate independent use of home exercise program to facilitate ability to maintain/progress functional gains from skilled physical therapy services.    Time 10    Period Weeks    Status New    Target Date 02/05/22      PT LONG TERM GOAL #3   Title Pt. will demonstrate FOTO outcome > or = 64 % to indicated reduced disability due to condition.    Time 10    Period Weeks    Status New    Target Date 02/05/22      PT LONG TERM GOAL #4   Title Patient will demonstrate Lt knee AROM 0-120 degrees to facilitate ability to perform transfers, sitting, ambulation, stair navigation s restriction due to mobility.    Time 10    Period Weeks    Status New    Target Date 02/05/22      PT LONG TERM GOAL #5   Title Patient will demonstrate Lt LE MMT 5/5, dynamometry with 15% of Rt throughout to facilitate ability to perform usual standing, walking, stairs at PLOF s limitation due to symptoms.    Time 10    Period Weeks    Status New    Target Date 02/05/22      Additional Long Term Goals   Additional Long Term Goals Yes      PT LONG TERM GOAL #6   Title Pt. will demonstrate ascending/descending stairs s UE assist c reciprocal gait pattern s symptoms.    Time 10    Period Weeks  Status New    Target Date 02/05/22                   Plan - 12/15/21 1611     Clinical Impression Statement Reviewed techniques of  soft tissue mobilization to aid distal quad tightness/trigger point release.  Pt. to benefit from skilled PT services to continue to improve quad strength/control in functional movement to facilitate return to PLOF.    Personal Factors and Comorbidities Comorbidity 1    Comorbidities HTN    Examination-Activity Limitations Sit;Sleep;Bed Mobility;Bend;Squat;Stairs;Stand;Carry;Transfers;Locomotion Level;Lift    Examination-Participation Restrictions Community Activity;Shop;Occupation;Other   exercise hiking, biking   Stability/Clinical Decision Making Stable/Uncomplicated    Rehab Potential Good    PT Frequency 2x / week    PT Duration Other (comment)   10 weeks   PT Treatment/Interventions ADLs/Self Care Home Management;Cryotherapy;Electrical Stimulation;Iontophoresis 4mg /ml Dexamethasone;Moist Heat;Balance training;Therapeutic exercise;Functional mobility training;Stair training;Gait training;Ultrasound;Neuromuscular re-education;Patient/family education;Passive range of motion;Joint Manipulations;Dry needling;Vasopneumatic Device;Manual techniques    PT Next Visit Plan FOTO reassessment, quad strengthening    PT Home Exercise Plan [id number]    Consulted and Agree with Plan of Care Patient             Patient will benefit from skilled therapeutic intervention in order to improve the following deficits and impairments:  Abnormal gait, Hypomobility, Pain, Increased edema, Decreased activity tolerance, Decreased strength, Difficulty walking, Decreased mobility, Decreased balance, Decreased range of motion, Improper body mechanics, Impaired perceived functional ability, Impaired flexibility, Decreased coordination  Visit Diagnosis: Chronic pain of left knee  Muscle weakness (generalized)  Difficulty in walking, not elsewhere classified  Localized edema     Problem List Patient Active Problem List   Diagnosis Date Noted   OA (osteoarthritis) of knee 11/12/2021   S/P TKR (total knee  replacement), left 11/12/2021   NSVT (nonsustained ventricular tachycardia) 05/20/2021   Paroxysmal atrial fibrillation ([place]) 05/20/2021   Pleurodynia 03/23/2021   Multiple fractures of ribs, right side, subsequent encounter for fracture with delayed healing 03/18/2021   Non-toxic multinodular goiter 02/25/2021   Nontoxic single thyroid nodule 02/25/2021   Personal history of malignant neoplasm of thyroid 02/25/2021   Hypothyroidism 02/25/2021   Thyroid cancer ([place]) 02/25/2021   Screen for colon cancer 02/20/2021   Chronic sinus bradycardia 02/17/2021   Primary osteoarthritis of both knees 02/17/2021   Primary osteoarthritis of both feet 02/17/2021   Primary osteoarthritis of left knee 09/23/2020   Routine general medical examination at health care facility 01/29/2020   Premature atrial contraction    BPH associated with nocturia 05/23/2019   Depression with anxiety 05/23/2019   Postoperative hypothyroidism 05/23/2019   Pes planus of both feet 09/11/2015    Scot Jun, PT, DPT, OCS, ATC 12/15/21  4:32 PM    [facility] [address] [place], [place], [address] Phone: [phone]   Fax:  [phone]  Name: Rickey Cochran MRN: [id number] Date of Birth: [birth year]

## 2021-12-17 ENCOUNTER — Other Ambulatory Visit: Payer: Self-pay

## 2021-12-17 ENCOUNTER — Encounter: Payer: Self-pay | Admitting: Rehabilitative and Restorative Service Providers"

## 2021-12-17 ENCOUNTER — Ambulatory Visit: Payer: No Typology Code available for payment source | Admitting: Rehabilitative and Restorative Service Providers"

## 2021-12-17 DIAGNOSIS — M6281 Muscle weakness (generalized): Secondary | ICD-10-CM

## 2021-12-17 DIAGNOSIS — R262 Difficulty in walking, not elsewhere classified: Secondary | ICD-10-CM

## 2021-12-17 DIAGNOSIS — G8929 Other chronic pain: Secondary | ICD-10-CM

## 2021-12-17 DIAGNOSIS — R600 Localized edema: Secondary | ICD-10-CM

## 2021-12-17 DIAGNOSIS — M25562 Pain in left knee: Secondary | ICD-10-CM

## 2021-12-17 NOTE — Therapy (Signed)
[facility] [address], [place], [address] Phone: [phone]   Fax:  [phone]  Physical Therapy Treatment  Patient Details  Name: Rickey Cochran MRN: [id number] Date of Birth: 1961-08-11 Referring Provider (PT): Marlou Sa Tonna Corner, MD   Encounter Date: 12/17/2021   PT End of Session - 12/17/21 1255     Visit Number 7    Number of Visits 20    Date for PT Re-Evaluation 02/05/22    Authorization Type [facility] $40 copay    Progress Note Due on Visit 10    PT Start Time 1194    PT Stop Time 1335    PT Time Calculation (min) 39 min    Activity Tolerance Patient tolerated treatment well    Behavior During Therapy [facility] for tasks assessed/performed             Past Medical History:  Diagnosis Date   Arthritis    Cancer ([place])    possible thyroid cancer, now s/p partial thyroidectomy    Depression    Dysrhythmia    Hypertension    several years ago was on lisiopril but lost weight and no longer needs   Hypothyroidism    mgd on levothyroxine    Left-sided tinnitus    Premature atrial contraction    was seen on EKG at his PCP approx 8 years ago, denies any symptoms, reports he was a runner before onset of hip pain in feb 2020   Sleep apnea     Past Surgical History:  Procedure Laterality Date   COLONOSCOPY     IR THORACENTESIS ASP PLEURAL SPACE W/IMG GUIDE  03/20/2021   JOINT REPLACEMENT Right 04/13/2019   right total hip arthroplasty   THYROID LOBECTOMY Right 05/09/2018   THYROID LOBECTOMY Right 05/09/2018   Procedure: RIGHT THYROID LOBECTOMY;  Surgeon: Ralene Ok, MD;  Location: [place];  Service: General;  Laterality: Right;   TOTAL HIP ARTHROPLASTY Right 04/13/2019   Procedure: RIGHT TOTAL HIP ARTHROPLASTY ANTERIOR APPROACH;  Surgeon: Mcarthur Rossetti, MD;  Location[facility];  Service: Orthopedics;  Laterality: Right;   TOTAL KNEE ARTHROPLASTY Left 11/12/2021   Procedure: LEFT TOTAL KNEE ARTHROPLASTY;   Surgeon: Meredith Pel, MD;  Location: [place];  Service: Orthopedics;  Laterality: Left;    There were no vitals filed for this visit.   Subjective Assessment - 12/17/21 1258     Subjective Pt. indicated that muscle area was similar to last time.  Tried doing some soft tissue rolling at home but didn't get it better yet.  No pain upon arrival with walking.    Pertinent History Lumbar spine surgery 01/04/2022 planned, HTN    Limitations Standing;Walking;House hold activities;Sitting    Patient Stated Goals Reduce pain, riding a bicycle, hiking    Currently in Pain? No/denies    Pain Score 0-No pain    Pain Onset 1 to 4 weeks ago                [facility] PT Assessment - 12/17/21 0001       Observation/Other Assessments   Focus on Therapeutic Outcomes (FOTO)  update 64%                           [facility] Adult PT Treatment/Exercise - 12/17/21 0001       Neuro Re-ed    Neuro Re-ed Details  SLS on foam c cone touching contralateral (anterior, anterior/medial, anterior/lateral), SLS vector  slider fwd/lateral/reverse x 10 each bilateral, tandem ambulation fwd/back 15 ft x 5 each way      Knee/Hip Exercises: Aerobic   Recumbent Bike Lvl 5 6 mins      Knee/Hip Exercises: Machines for Strengthening   Cybex Knee Extension double leg up, Lt leg lowering full range 20 lbs 3 x 10    Total Gym Leg Press single leg Lt 68 lbs 3 x 15      Knee/Hip Exercises: Standing   Lateral Step Up Step Height: 6";2 sets;10 reps;Left   eccentric lowering focus   Lateral Step Up Limitations --    Other Standing Knee Exercises decline ramp squat x 15      Manual Therapy   Manual therapy comments percussive device to distal and middle 1/3 of Lt quad in sitting                       PT Short Term Goals - 12/09/21 1148       PT SHORT TERM GOAL #1   Title Patient will demonstrate independent use of home exercise program to maintain progress from in clinic treatments.     Time 3    Period Weeks    Status Achieved    Target Date 12/18/21               PT Long Term Goals - 11/27/21 1136       PT LONG TERM GOAL #1   Title Patient will demonstrate/report pain at worst less than or equal to 2/10 to facilitate minimal limitation in daily activity secondary to pain symptoms.    Time 10    Period Weeks    Status New    Target Date 02/05/22      PT LONG TERM GOAL #2   Title Patient will demonstrate independent use of home exercise program to facilitate ability to maintain/progress functional gains from skilled physical therapy services.    Time 10    Period Weeks    Status New    Target Date 02/05/22      PT LONG TERM GOAL #3   Title Pt. will demonstrate FOTO outcome > or = 64 % to indicated reduced disability due to condition.    Time 10    Period Weeks    Status New    Target Date 02/05/22      PT LONG TERM GOAL #4   Title Patient will demonstrate Lt knee AROM 0-120 degrees to facilitate ability to perform transfers, sitting, ambulation, stair navigation s restriction due to mobility.    Time 10    Period Weeks    Status New    Target Date 02/05/22      PT LONG TERM GOAL #5   Title Patient will demonstrate Lt LE MMT 5/5, dynamometry with 15% of Rt throughout to facilitate ability to perform usual standing, walking, stairs at PLOF s limitation due to symptoms.    Time 10    Period Weeks    Status New    Target Date 02/05/22      Additional Long Term Goals   Additional Long Term Goals Yes      PT LONG TERM GOAL #6   Title Pt. will demonstrate ascending/descending stairs s UE assist c reciprocal gait pattern s symptoms.    Time 10    Period Weeks    Status New    Target Date 02/05/22  Plan - 12/17/21 1319     Clinical Impression Statement Pt. demonstrated improved quality of control on single leg stance activity today bilaterally.  FOTO reassessment was notably better than evaluation. Eccentric step down  control proved still difficult. Continued skilled PT services warranted to progress towards goals.    Personal Factors and Comorbidities Comorbidity 1    Comorbidities HTN    Examination-Activity Limitations Sit;Sleep;Bed Mobility;Bend;Squat;Stairs;Stand;Carry;Transfers;Locomotion Level;Lift    Examination-Participation Restrictions Community Activity;Shop;Occupation;Other   exercise hiking, biking   Stability/Clinical Decision Making Stable/Uncomplicated    Rehab Potential Good    PT Frequency 2x / week    PT Duration Other (comment)   10 weeks   PT Treatment/Interventions ADLs/Self Care Home Management;Cryotherapy;Electrical Stimulation;Iontophoresis 4mg /ml Dexamethasone;Moist Heat;Balance training;Therapeutic exercise;Functional mobility training;Stair training;Gait training;Ultrasound;Neuromuscular re-education;Patient/family education;Passive range of motion;Joint Manipulations;Dry needling;Vasopneumatic Device;Manual techniques    PT Next Visit Plan Full objective data reassessment for MD visit.    PT Home Exercise Plan [id number]    Consulted and Agree with Plan of Care Patient             Patient will benefit from skilled therapeutic intervention in order to improve the following deficits and impairments:  Abnormal gait, Hypomobility, Pain, Increased edema, Decreased activity tolerance, Decreased strength, Difficulty walking, Decreased mobility, Decreased balance, Decreased range of motion, Improper body mechanics, Impaired perceived functional ability, Impaired flexibility, Decreased coordination  Visit Diagnosis: Chronic pain of left knee  Muscle weakness (generalized)  Difficulty in walking, not elsewhere classified  Localized edema  Difficulty walking     Problem List Patient Active Problem List   Diagnosis Date Noted   OA (osteoarthritis) of knee 11/12/2021   S/P TKR (total knee replacement), left 11/12/2021   NSVT (nonsustained ventricular tachycardia) 05/20/2021    Paroxysmal atrial fibrillation ([place]) 05/20/2021   Pleurodynia 03/23/2021   Multiple fractures of ribs, right side, subsequent encounter for fracture with delayed healing 03/18/2021   Non-toxic multinodular goiter 02/25/2021   Nontoxic single thyroid nodule 02/25/2021   Personal history of malignant neoplasm of thyroid 02/25/2021   Hypothyroidism 02/25/2021   Thyroid cancer ([place]) 02/25/2021   Screen for colon cancer 02/20/2021   Chronic sinus bradycardia 02/17/2021   Primary osteoarthritis of both knees 02/17/2021   Primary osteoarthritis of both feet 02/17/2021   Primary osteoarthritis of left knee 09/23/2020   Routine general medical examination at health care facility 01/29/2020   Premature atrial contraction    BPH associated with nocturia 05/23/2019   Depression with anxiety 05/23/2019   Postoperative hypothyroidism 05/23/2019   Pes planus of both feet 09/11/2015    Scot Jun, PT, DPT, OCS, ATC 12/17/21  1:35 PM    [facility] [address] [place], [place], [address] Phone: [phone]   Fax:  [phone]  Name: Rickey Cochran MRN: [id number] Date of Birth: March 20, 1961

## 2021-12-22 ENCOUNTER — Encounter: Payer: Self-pay | Admitting: Rehabilitative and Restorative Service Providers"

## 2021-12-22 ENCOUNTER — Other Ambulatory Visit: Payer: Self-pay

## 2021-12-22 ENCOUNTER — Ambulatory Visit: Payer: No Typology Code available for payment source | Admitting: Rehabilitative and Restorative Service Providers"

## 2021-12-22 DIAGNOSIS — M6281 Muscle weakness (generalized): Secondary | ICD-10-CM

## 2021-12-22 DIAGNOSIS — G8929 Other chronic pain: Secondary | ICD-10-CM

## 2021-12-22 DIAGNOSIS — R600 Localized edema: Secondary | ICD-10-CM

## 2021-12-22 DIAGNOSIS — R262 Difficulty in walking, not elsewhere classified: Secondary | ICD-10-CM | POA: Diagnosis not present

## 2021-12-22 DIAGNOSIS — M25562 Pain in left knee: Secondary | ICD-10-CM

## 2021-12-22 NOTE — Therapy (Signed)
[facility] [address] [place], [place], [address] Phone: [phone]   Fax:  [phone]  Physical Therapy Treatment /Discharge   Patient Details  Name: Rickey Cochran MRN: [id number] Date of Birth: [birth year] Referring Provider (PT): Marlou Sa Tonna Corner, MD   Encounter Date: 12/22/2021 PHYSICAL THERAPY DISCHARGE SUMMARY  Visits from Start of Care: 8  Current functional level related to goals / functional outcomes: See note   Remaining deficits: See note   Education / Equipment: HEP   Patient agrees to discharge. Patient goals were met. Patient is being discharged due to meeting the stated rehab goals.    PT End of Session - 12/22/21 1258     Visit Number 8    Number of Visits 20    Date for PT Re-Evaluation 02/05/22    Authorization Type [facility] $40 copay    Progress Note Due on Visit 18    PT Start Time 1257    PT Stop Time 1336    PT Time Calculation (min) 39 min    Activity Tolerance Patient tolerated treatment well    Behavior During Therapy WFL for tasks assessed/performed             Past Medical History:  Diagnosis Date   Arthritis    Cancer ([place])    possible thyroid cancer, now s/p partial thyroidectomy    Depression    Dysrhythmia    Hypertension    several years ago was on lisiopril but lost weight and no longer needs   Hypothyroidism    mgd on levothyroxine    Left-sided tinnitus    Premature atrial contraction    was seen on EKG at his PCP approx 8 years ago, denies any symptoms, reports he was a runner before onset of hip pain in feb 2020   Sleep apnea     Past Surgical History:  Procedure Laterality Date   COLONOSCOPY     IR THORACENTESIS ASP PLEURAL SPACE W/IMG GUIDE  03/20/2021   JOINT REPLACEMENT Right 04/13/2019   right total hip arthroplasty   THYROID LOBECTOMY Right 05/09/2018   THYROID LOBECTOMY Right 05/09/2018   Procedure: RIGHT THYROID LOBECTOMY;  Surgeon: Ralene Ok, MD;   Location: [facility];  Service: General;  Laterality: Right;   TOTAL HIP ARTHROPLASTY Right 04/13/2019   Procedure: RIGHT TOTAL HIP ARTHROPLASTY ANTERIOR APPROACH;  Surgeon: Mcarthur Rossetti, MD;  Location: [facility];  Service: Orthopedics;  Laterality: Right;   TOTAL KNEE ARTHROPLASTY Left 11/12/2021   Procedure: LEFT TOTAL KNEE ARTHROPLASTY;  Surgeon: Meredith Pel, MD;  Location: [facility];  Service: Orthopedics;  Laterality: Left;    There were no vitals filed for this visit.   Subjective Assessment - 12/22/21 1303     Subjective Pt. reported overall improvement at 80% back to normal. Pt. stated one area of proximal incision that seems thicker or raised that pops sometimes.    Pertinent History Lumbar spine surgery 01/04/2022 planned, HTN    Limitations Standing;Walking;House hold activities;Sitting    Patient Stated Goals Reduce pain, riding a bicycle, hiking    Currently in Pain? No/denies    Pain Onset 1 to 4 weeks ago                [facility] PT Assessment - 12/22/21 0001       Assessment   Medical Diagnosis M25.562,G89.29 (ICD-10-CM) - Chronic pain of left knee    Referring Provider (PT) Marlou Sa Tonna Corner, MD    Onset Date/Surgical  Date 11/12/21    Hand Dominance Right      Observation/Other Assessments   Focus on Therapeutic Outcomes (FOTO)  update 64%   updated from 12/17/2021     Single Leg Stance   Comments Lt 30 seconds      AROM   Left Knee Extension -3   in sitting LAQ.  0 degrees in supine heel prop   Left Knee Flexion 137   in heel slide     Strength   Right Knee Flexion 5/5    Right Knee Extension 5/5    Left Knee Flexion 5/5    Left Knee Extension 5/5   63, 55 lbs                          [facility] Adult PT Treatment/Exercise - 12/22/21 0001       Knee/Hip Exercises: Aerobic   Recumbent Bike Lvl 5 10 mins      Knee/Hip Exercises: Machines for Strengthening   Cybex Knee Extension double leg up, Lt leg lowering full range 20 lbs 3  x 10    Total Gym Leg Press single leg Lt 75 lbs 3 x 15      Knee/Hip Exercises: Standing   Lateral Step Up Step Height: 6";10 reps;Left;3 sets;Hand Hold: 1      Manual Therapy   Manual therapy comments cross friction massage to proximal incision c cues for home use                     PT Education - 12/22/21 1323     Education Details Discharge planning    Person(s) Educated Patient    Methods Explanation;Demonstration    Comprehension Verbalized understanding;Returned demonstration              PT Short Term Goals - 12/09/21 1148       PT SHORT TERM GOAL #1   Title Patient will demonstrate independent use of home exercise program to maintain progress from in clinic treatments.    Time 3    Period Weeks    Status Achieved    Target Date 12/18/21               PT Long Term Goals - 12/22/21 1323       PT LONG TERM GOAL #1   Title Patient will demonstrate/report pain at worst less than or equal to 2/10 to facilitate minimal limitation in daily activity secondary to pain symptoms.    Time 10    Period Weeks    Status Achieved    Target Date 02/05/22      PT LONG TERM GOAL #2   Title Patient will demonstrate independent use of home exercise program to facilitate ability to maintain/progress functional gains from skilled physical therapy services.    Time 10    Period Weeks    Status Achieved    Target Date 02/05/22      PT LONG TERM GOAL #3   Title Pt. will demonstrate FOTO outcome > or = 64 % to indicated reduced disability due to condition.    Time 10    Period Weeks    Status Achieved    Target Date 02/05/22      PT LONG TERM GOAL #4   Title Patient will demonstrate Lt knee AROM 0-120 degrees to facilitate ability to perform transfers, sitting, ambulation, stair navigation s restriction due to mobility.    Time 10  Period Weeks    Status Achieved    Target Date 02/05/22      PT LONG TERM GOAL #5   Title Patient will demonstrate Lt  LE MMT 5/5, dynamometry with 15% of Rt throughout to facilitate ability to perform usual standing, walking, stairs at PLOF s limitation due to symptoms.    Time 10    Period Weeks    Status Achieved    Target Date 02/05/22      PT LONG TERM GOAL #6   Title Pt. will demonstrate ascending/descending stairs s UE assist c reciprocal gait pattern s symptoms.    Time 10    Period Weeks    Status Achieved    Target Date 02/05/22                   Plan - 12/22/21 1328     Clinical Impression Statement Pt. has attended 8 visits overall during course of treatment, showing good improvement and reported reduced disability due to symptoms.  See objective data for updated information.  Pt. has demonstrated reaching established goals and was knowledgeable of HEP.  Due to improvements and upcoming back surgery, recommend d/c to HEP at this time.    Personal Factors and Comorbidities Comorbidity 1    Comorbidities HTN    Examination-Activity Limitations Sit;Sleep;Bed Mobility;Bend;Squat;Stairs;Stand;Carry;Transfers;Locomotion Level;Lift    Examination-Participation Restrictions Community Activity;Shop;Occupation;Other   exercise hiking, biking   Stability/Clinical Decision Making Stable/Uncomplicated    Rehab Potential Good    PT Frequency 2x / week    PT Duration Other (comment)   10 weeks   PT Treatment/Interventions ADLs/Self Care Home Management;Cryotherapy;Electrical Stimulation;Iontophoresis 48m/ml Dexamethasone;Moist Heat;Balance training;Therapeutic exercise;Functional mobility training;Stair training;Gait training;Ultrasound;Neuromuscular re-education;Patient/family education;Passive range of motion;Joint Manipulations;Dry needling;Vasopneumatic Device;Manual techniques    PT Next Visit Plan D/C to HEP    PT Home Exercise Plan [id number]   Consulted and Agree with Plan of Care Patient             Patient will benefit from skilled therapeutic intervention in order to improve the  following deficits and impairments:  Abnormal gait, Hypomobility, Pain, Increased edema, Decreased activity tolerance, Decreased strength, Difficulty walking, Decreased mobility, Decreased balance, Decreased range of motion, Improper body mechanics, Impaired perceived functional ability, Impaired flexibility, Decreased coordination  Visit Diagnosis: Chronic pain of left knee  Muscle weakness (generalized)  Difficulty in walking, not elsewhere classified  Localized edema     Problem List Patient Active Problem List   Diagnosis Date Noted   OA (osteoarthritis) of knee 11/12/2021   S/P TKR (total knee replacement), left 11/12/2021   NSVT (nonsustained ventricular tachycardia) 05/20/2021   Paroxysmal atrial fibrillation ([place] 05/20/2021   Pleurodynia 03/23/2021   Multiple fractures of ribs, right side, subsequent encounter for fracture with delayed healing 03/18/2021   Non-toxic multinodular goiter 02/25/2021   Nontoxic single thyroid nodule 02/25/2021   Personal history of malignant neoplasm of thyroid 02/25/2021   Hypothyroidism 02/25/2021   Thyroid cancer ([place] 02/25/2021   Screen for colon cancer 02/20/2021   Chronic sinus bradycardia 02/17/2021   Primary osteoarthritis of both knees 02/17/2021   Primary osteoarthritis of both feet 02/17/2021   Primary osteoarthritis of left knee 09/23/2020   Routine general medical examination at health care facility 01/29/2020   Premature atrial contraction    BPH associated with nocturia 05/23/2019   Depression with anxiety 05/23/2019   Postoperative hypothyroidism 05/23/2019   Pes planus of both feet 09/11/2015    MScot Jun PT, DPT, OCS, ATC  12/22/21  1:34 PM    [facility] [address] [place], [place], [address] Phone: [phone]   Fax:  [phone]  Name: Rickey Cochran MRN: [id number] Date of Birth: 1960-11-30

## 2021-12-24 ENCOUNTER — Encounter: Payer: No Typology Code available for payment source | Admitting: Rehabilitative and Restorative Service Providers"

## 2021-12-24 ENCOUNTER — Other Ambulatory Visit: Payer: Self-pay

## 2021-12-24 ENCOUNTER — Ambulatory Visit (INDEPENDENT_AMBULATORY_CARE_PROVIDER_SITE_OTHER): Payer: No Typology Code available for payment source | Admitting: Orthopedic Surgery

## 2021-12-24 DIAGNOSIS — Z96652 Presence of left artificial knee joint: Secondary | ICD-10-CM

## 2021-12-25 ENCOUNTER — Encounter: Payer: Self-pay | Admitting: Orthopedic Surgery

## 2021-12-25 NOTE — Progress Notes (Signed)
Post-Op Visit Note   Patient: Rickey Cochran           Date of Birth: 10/27/1961           MRN: [id number] Visit Date: 12/24/2021 PCP: Janith Lima, MD   Assessment & Plan:  Chief Complaint:  Chief Complaint  Patient presents with   Post-op Follow-up    left total knee arthroplasty on 11/12/2021.    Visit Diagnoses: No diagnosis found.  Plan: Patient is a 61 year old male who presents s/p left total knee arthroplasty on 11/12/2021.  He reports that he is doing better and feels like he is walking more easily.  He still notes some stiffness at times that sets in with immobility.  He has graduated from physical therapy and been released.  He reached 136 degrees in physical therapy.  He denies any difficulty with stairs or with getting off of the toilet.  He uses occasional over-the-counter medication for pain control but this is nothing that he has to take consistently.  He was able to accomplish a 4 to 5 mile hike outdoors on a hiking trail on Sunday without any issues.  On exam he has 0 degrees extension and 130 degrees of knee flexion.  Trace effusion noted.  Incision well-healed.  No calf tenderness.  Negative Homans' sign.  Excellent quad strength rated 5/5.  Able to perform straight leg raise without difficulty.  He walks without significant antalgia.  Plan is for patient to follow-up with the office as needed.  Counseled him on dental antibiotic prophylaxis.  He is doing exceptionally well with excellent range of motion and strength at the 6-week point.  Did inform him that he can expect to have some continual improvement with maximum medical improvement around the 9 to 69-month mark.  He has upcoming surgery by Dr. Ellene Route on February 13.  Follow-up with the office as needed.  Follow-Up Instructions: No follow-ups on file.   Orders:  No orders of the defined types were placed in this encounter.  No orders of the defined types were placed in this encounter.   Imaging: No  results found.  PMFS History: Patient Active Problem List   Diagnosis Date Noted   OA (osteoarthritis) of knee 11/12/2021   S/P TKR (total knee replacement), left 11/12/2021   NSVT (nonsustained ventricular tachycardia) 05/20/2021   Paroxysmal atrial fibrillation ([place]) 05/20/2021   Pleurodynia 03/23/2021   Multiple fractures of ribs, right side, subsequent encounter for fracture with delayed healing 03/18/2021   Non-toxic multinodular goiter 02/25/2021   Nontoxic single thyroid nodule 02/25/2021   Personal history of malignant neoplasm of thyroid 02/25/2021   Hypothyroidism 02/25/2021   Thyroid cancer ([place]) 02/25/2021   Screen for colon cancer 02/20/2021   Chronic sinus bradycardia 02/17/2021   Primary osteoarthritis of both knees 02/17/2021   Primary osteoarthritis of both feet 02/17/2021   Primary osteoarthritis of left knee 09/23/2020   Routine general medical examination at health care facility 01/29/2020   Premature atrial contraction    BPH associated with nocturia 05/23/2019   Depression with anxiety 05/23/2019   Postoperative hypothyroidism 05/23/2019   Pes planus of both feet 09/11/2015   Past Medical History:  Diagnosis Date   Arthritis    Cancer ([place])    possible thyroid cancer, now s/p partial thyroidectomy    Depression    Dysrhythmia    Hypertension    several years ago was on lisiopril but lost weight and no longer needs   Hypothyroidism  mgd on levothyroxine    Left-sided tinnitus    Premature atrial contraction    was seen on EKG at his PCP approx 8 years ago, denies any symptoms, reports he was a runner before onset of hip pain in feb 2020   Sleep apnea     No family history on file.  Past Surgical History:  Procedure Laterality Date   COLONOSCOPY     IR THORACENTESIS ASP PLEURAL SPACE W/IMG GUIDE  03/20/2021   JOINT REPLACEMENT Right 04/13/2019   right total hip arthroplasty   THYROID LOBECTOMY Right 05/09/2018   THYROID LOBECTOMY Right  05/09/2018   Procedure: RIGHT THYROID LOBECTOMY;  Surgeon: Ralene Ok, MD;  Location: [facility];  Service: General;  Laterality: Right;   TOTAL HIP ARTHROPLASTY Right 04/13/2019   Procedure: RIGHT TOTAL HIP ARTHROPLASTY ANTERIOR APPROACH;  Surgeon: Mcarthur Rossetti, MD;  Location[facility];  Service: Orthopedics;  Laterality: Right;   TOTAL KNEE ARTHROPLASTY Left 11/12/2021   Procedure: LEFT TOTAL KNEE ARTHROPLASTY;  Surgeon: Meredith Pel, MD;  Location: [facility];  Service: Orthopedics;  Laterality: Left;   Social History   Occupational History    Comment: Self employed  Tobacco Use   Smoking status: Former   Smokeless tobacco: Never   Tobacco comments:    QUIT IN 2004  Vaping Use   Vaping Use: Never used  Substance and Sexual Activity   Alcohol use: Not Currently    Alcohol/week: 0.0 standard drinks    Comment: quit   Drug use: Never   Sexual activity: Not on file

## 2021-12-30 NOTE — Progress Notes (Signed)
Surgical Instructions    Your procedure is scheduled on Monday February 13th..  Report to [facility] Main Entrance "A" at 5:30 A.M., then check in with the Admitting office.  Call this number if you have problems the morning of surgery:  [phone]   If you have any questions prior to your surgery date call [phone]: Open Monday-Friday 8am-4pm    Remember:  Do not eat after midnight the night before your surgery  You may drink clear liquids until 4:30am the morning of your surgery.   Clear liquids allowed are: Water, Non-Citrus Juices (without pulp), Carbonated Beverages, Clear Tea, Black Coffee ONLY (NO MILK, CREAM OR POWDERED CREAMER of any kind), and Gatorade  Enhanced Recovery after Surgery for Orthopedics Enhanced Recovery after Surgery is a protocol used to improve the stress on your body and your recovery after surgery.  Patient Instructions  The day of surgery (if you do NOT have diabetes):  Drink ONE (1) Pre-Surgery Clear Ensure by ___4:30__ am the morning of surgery   This drink was given to you during your hospital  pre-op appointment visit. Nothing else to drink after completing the  Pre-Surgery Clear Ensure.           If you have questions, please contact your surgeons office.    Take these medicines the morning of surgery with A SIP OF WATER: alfuzosin (UROXATRAL) 10 MG 24 hr tablet levothyroxine (SYNTHROID) 88 MCG tablet   IF NEEDED  acetaminophen (TYLENOL) 500 MG tablet traMADol (ULTRAM) 50 MG tablet   Follow your surgeon's instructions on when to stop Aspirin.  If no instructions were given by your surgeon then you will need to call the office to get those instructions.     As of today, STOP taking any Aspirin (unless otherwise instructed by your surgeon) Celebrex, Aleve, Naproxen, Ibuprofen, Motrin, Advil, Goody's, BC's, all herbal medications, fish oil, and all vitamins.           Do not wear jewelry  Do not wear lotions, powders, colognes,  or deodorant. Do not shave 48 hours prior to surgery.  Men may shave face and neck. Do not bring valuables to the hospital. Do not wear nail polish, gel polish, artificial nails, or any other type of covering on natural nails (fingers and toes) If you have artificial nails or gel coating that need to be removed by a nail salon, please have this removed prior to surgery. Artificial nails or gel coating may interfere with anesthesia's ability to adequately monitor your vital signs.  [facility] is not responsible for any belongings or valuables. .   Do NOT Smoke (Tobacco/Vaping)  24 hours prior to your procedure  If you use a CPAP at night, you may bring your mask for your overnight stay.   Contacts, glasses, hearing aids, dentures or partials may not be worn into surgery, please bring cases for these belongings   For patients admitted to the hospital, discharge time will be determined by your treatment team.   Patients discharged the day of surgery will not be allowed to drive home, and someone needs to stay with them for 24 hours.  NO VISITORS WILL BE ALLOWED IN PRE-OP WHERE PATIENTS ARE PREPPED FOR SURGERY.  ONLY 1 SUPPORT PERSON MAY BE PRESENT IN THE WAITING ROOM WHILE YOU ARE IN SURGERY.  IF YOU ARE TO BE ADMITTED, ONCE YOU ARE IN YOUR ROOM YOU WILL BE ALLOWED TWO (2) VISITORS. 1 (ONE) VISITOR MAY STAY OVERNIGHT BUT MUST ARRIVE TO THE  ROOM BY 8pm.  Minor children may have two parents present. Special consideration for safety and communication needs will be reviewed on a case by case basis.  Special instructions:    Oral Hygiene is also important to reduce your risk of infection.  Remember - BRUSH YOUR TEETH THE MORNING OF SURGERY WITH YOUR REGULAR TOOTHPASTE   [facility]- Preparing For Surgery  Before surgery, you can play an important role. Because skin is not sterile, your skin needs to be as free of germs as possible. You can reduce the number of germs on your skin by washing with  CHG (chlorahexidine gluconate) Soap before surgery.  CHG is an antiseptic cleaner which kills germs and bonds with the skin to continue killing germs even after washing.     Please do not use if you have an allergy to CHG or antibacterial soaps. If your skin becomes reddened/irritated stop using the CHG.  Do not shave (including legs and underarms) for at least 48 hours prior to first CHG shower. It is OK to shave your face.  Please follow these instructions carefully.     Shower the NIGHT BEFORE SURGERY and the MORNING OF SURGERY with CHG Soap.   If you chose to wash your hair, wash your hair first as usual with your normal shampoo. After you shampoo, rinse your hair and body thoroughly to remove the shampoo.  Then [employer] and genitals (private parts) with your normal soap and rinse thoroughly to remove soap.  After that Use CHG Soap as you would any other liquid soap. You can apply CHG directly to the skin and wash gently with a scrungie or a clean washcloth.   Apply the CHG Soap to your body ONLY FROM THE NECK DOWN.  Do not use on open wounds or open sores. Avoid contact with your eyes, ears, mouth and genitals (private parts). Wash Face and genitals (private parts)  with your normal soap.   Wash thoroughly, paying special attention to the area where your surgery will be performed.  Thoroughly rinse your body with warm water from the neck down.  DO NOT shower/wash with your normal soap after using and rinsing off the CHG Soap.  Pat yourself dry with a CLEAN TOWEL.  Wear CLEAN PAJAMAS to bed the night before surgery  Place CLEAN SHEETS on your bed the night before your surgery  DO NOT SLEEP WITH PETS.   Day of Surgery:  Take a shower with CHG soap. Wear Clean/Comfortable clothing the morning of surgery Do not apply any deodorants/lotions.   Remember to brush your teeth WITH YOUR REGULAR TOOTHPASTE.    COVID testing  If you are going to stay overnight or be admitted after  your procedure/surgery and require a pre-op COVID test, please follow these instructions after your COVID test   You are not required to quarantine however you are required to wear a well-fitting mask when you are out and around people not in your household.  If your mask becomes wet or soiled, replace with a new one.  Wash your hands often with soap and water for 20 seconds or clean your hands with an alcohol-based hand sanitizer that contains at least 60% alcohol.  Do not share personal items.  Notify your provider: if you are in close contact with someone who has COVID  or if you develop a fever of 100.4 or greater, sneezing, cough, sore throat, shortness of breath or body aches.    Please read over the following  fact sheets that you were given.

## 2021-12-31 ENCOUNTER — Other Ambulatory Visit (HOSPITAL_COMMUNITY): Payer: Self-pay

## 2021-12-31 ENCOUNTER — Encounter (HOSPITAL_COMMUNITY)
Admission: RE | Admit: 2021-12-31 | Discharge: 2021-12-31 | Disposition: A | Discharge status: Home / Self Care | Payer: No Typology Code available for payment source | Source: Ambulatory Visit | Attending: Neurological Surgery | Admitting: Neurological Surgery

## 2021-12-31 ENCOUNTER — Encounter (HOSPITAL_COMMUNITY): Payer: Self-pay

## 2021-12-31 ENCOUNTER — Other Ambulatory Visit: Payer: Self-pay

## 2021-12-31 VITALS — BP 133/71 | HR 82 | Temp 98.6°F | Resp 17 | Ht 74.0 in | Wt 222.2 lb

## 2021-12-31 DIAGNOSIS — Z87891 Personal history of nicotine dependence: Secondary | ICD-10-CM | POA: Insufficient documentation

## 2021-12-31 DIAGNOSIS — G4733 Obstructive sleep apnea (adult) (pediatric): Secondary | ICD-10-CM | POA: Diagnosis not present

## 2021-12-31 DIAGNOSIS — I472 Ventricular tachycardia, unspecified: Secondary | ICD-10-CM | POA: Insufficient documentation

## 2021-12-31 DIAGNOSIS — I493 Ventricular premature depolarization: Secondary | ICD-10-CM | POA: Insufficient documentation

## 2021-12-31 DIAGNOSIS — Z96652 Presence of left artificial knee joint: Secondary | ICD-10-CM | POA: Diagnosis not present

## 2021-12-31 DIAGNOSIS — Z20822 Contact with and (suspected) exposure to covid-19: Secondary | ICD-10-CM | POA: Insufficient documentation

## 2021-12-31 DIAGNOSIS — Z9989 Dependence on other enabling machines and devices: Secondary | ICD-10-CM | POA: Diagnosis not present

## 2021-12-31 DIAGNOSIS — I480 Paroxysmal atrial fibrillation: Secondary | ICD-10-CM | POA: Insufficient documentation

## 2021-12-31 DIAGNOSIS — S2241XD Multiple fractures of ribs, right side, subsequent encounter for fracture with routine healing: Secondary | ICD-10-CM | POA: Diagnosis not present

## 2021-12-31 DIAGNOSIS — I10 Essential (primary) hypertension: Secondary | ICD-10-CM | POA: Insufficient documentation

## 2021-12-31 DIAGNOSIS — Z96641 Presence of right artificial hip joint: Secondary | ICD-10-CM | POA: Diagnosis not present

## 2021-12-31 DIAGNOSIS — Z01812 Encounter for preprocedural laboratory examination: Secondary | ICD-10-CM | POA: Diagnosis not present

## 2021-12-31 DIAGNOSIS — H9312 Tinnitus, left ear: Secondary | ICD-10-CM | POA: Insufficient documentation

## 2021-12-31 DIAGNOSIS — E039 Hypothyroidism, unspecified: Secondary | ICD-10-CM | POA: Insufficient documentation

## 2021-12-31 DIAGNOSIS — M48062 Spinal stenosis, lumbar region with neurogenic claudication: Secondary | ICD-10-CM | POA: Insufficient documentation

## 2021-12-31 DIAGNOSIS — Z01818 Encounter for other preprocedural examination: Secondary | ICD-10-CM

## 2021-12-31 LAB — CBC
HCT: 43.6 % (ref 39.0–52.0)
Hemoglobin: 15 g/dL (ref 13.0–17.0)
MCH: 32.2 pg (ref 26.0–34.0)
MCHC: 34.4 g/dL (ref 30.0–36.0)
MCV: 93.6 fL (ref 80.0–100.0)
Platelets: 288 10*3/uL (ref 150–400)
RBC: 4.66 MIL/uL (ref 4.22–5.81)
RDW: 12.1 % (ref 11.5–15.5)
WBC: 8.4 10*3/uL (ref 4.0–10.5)
nRBC: 0 % (ref 0.0–0.2)

## 2021-12-31 LAB — TYPE AND SCREEN
ABO/RH(D): A POS
Antibody Screen: NEGATIVE

## 2021-12-31 LAB — BASIC METABOLIC PANEL
Anion gap: 9 (ref 5–15)
BUN: 14 mg/dL (ref 8–23)
CO2: 27 mmol/L (ref 22–32)
Calcium: 9.2 mg/dL (ref 8.9–10.3)
Chloride: 104 mmol/L (ref 98–111)
Creatinine, Ser: 0.84 mg/dL (ref 0.61–1.24)
GFR, Estimated: >60 mL/min (ref >60)
Glucose, Bld: 101 mg/dL — ABNORMAL HIGH (ref 70–99)
Potassium: 4.3 mmol/L (ref 3.5–5.1)
Sodium: 140 mmol/L (ref 135–145)

## 2021-12-31 LAB — SURGICAL PCR SCREEN
MRSA, PCR: NEGATIVE
Staphylococcus aureus: NEGATIVE

## 2021-12-31 NOTE — Progress Notes (Signed)
PCP - Volanda Napoleon Cardiologist - Rema Jasmine  PPM/ICD - denies Device Orders -  Rep Notified -   Chest x-ray -  EKG - 04/28/21 Stress Test -  ECHO -04/30/21  Cardiac Cath -   Sleep Study - yes CPAP - doesn't wear  Fasting Blood Sugar - n/a Checks Blood Sugar _____ times a day  Blood Thinner Instructions:n/a  Aspirin Instructions:pt reports he doesn't take Aspirin  ERAS Protcol -clear liquids until 0430 PRE-SURGERY Ensure or G2- no  COVID TEST- 12/31/21   Anesthesia review: yes. Pt c/o new left calf tenderness. Leg not red and calf measurements the same in right and left leg. No swelling. Pt had LTKR in December and took a car trip approximately 3 1/2 hours this past weekend. Pt denied shortness of breath. VSS. Notified Karoline Caldwell Anesthesia PA. James examined pt. No further interventions required.  Patient denies shortness of breath, fever, cough and chest pain at PAT appointment   All instructions explained to the patient, with a verbal understanding of the material. Patient agrees to go over the instructions while at home for a better understanding. Patient also instructed to wear a mask in public after being tested for COVID-19. The opportunity to ask questions was provided.

## 2021-12-31 NOTE — Progress Notes (Signed)
Surgical Instructions    Your procedure is scheduled on Monday February 13th..  Report to [facility] Main Entrance "A" at 5:30 A.M., then check in with the Admitting office.  Call this number if you have problems the morning of surgery:  [phone]   If you have any questions prior to your surgery date call [phone]: Open Monday-Friday 8am-4pm    Remember:  Do not eat after midnight the night before your surgery  You may drink clear liquids until 4:30am the morning of your surgery.   Clear liquids allowed are: Water, Non-Citrus Juices (without pulp), Carbonated Beverages, Clear Tea, Black Coffee ONLY (NO MILK, CREAM OR POWDERED CREAMER of any kind), and Gatorade      Take these medicines the morning of surgery with A SIP OF WATER: alfuzosin (UROXATRAL) 10 MG 24 hr tablet levothyroxine (SYNTHROID) 88 MCG tablet   IF NEEDED  acetaminophen (TYLENOL) 500 MG tablet traMADol (ULTRAM) 50 MG tablet   Follow your surgeon's instructions on when to stop Aspirin.  If no instructions were given by your surgeon then you will need to call the office to get those instructions.     As of today, STOP taking any Aspirin (unless otherwise instructed by your surgeon) Celebrex, Aleve, Naproxen, Ibuprofen, Motrin, Advil, Goody's, BC's, all herbal medications, fish oil, and all vitamins.           Do not wear jewelry  Do not wear lotions, powders, colognes, or deodorant. Do not shave 48 hours prior to surgery.  Men may shave face and neck. Do not bring valuables to the hospital. Do not wear nail polish, gel polish, artificial nails, or any other type of covering on natural nails (fingers and toes) If you have artificial nails or gel coating that need to be removed by a nail salon, please have this removed prior to surgery. Artificial nails or gel coating may interfere with anesthesia's ability to adequately monitor your vital signs.  [facility] is not responsible for any belongings or  valuables. .   Do NOT Smoke (Tobacco/Vaping)  24 hours prior to your procedure  If you use a CPAP at night, you may bring your mask for your overnight stay.   Contacts, glasses, hearing aids, dentures or partials may not be worn into surgery, please bring cases for these belongings   For patients admitted to the hospital, discharge time will be determined by your treatment team.   Patients discharged the day of surgery will not be allowed to drive home, and someone needs to stay with them for 24 hours.  NO VISITORS WILL BE ALLOWED IN PRE-OP WHERE PATIENTS ARE PREPPED FOR SURGERY.  ONLY 1 SUPPORT PERSON MAY BE PRESENT IN THE WAITING ROOM WHILE YOU ARE IN SURGERY.  IF YOU ARE TO BE ADMITTED, ONCE YOU ARE IN YOUR ROOM YOU WILL BE ALLOWED TWO (2) VISITORS. 1 (ONE) VISITOR MAY STAY OVERNIGHT BUT MUST ARRIVE TO THE ROOM BY 8pm.  Minor children may have two parents present. Special consideration for safety and communication needs will be reviewed on a case by case basis.  Special instructions:    Oral Hygiene is also important to reduce your risk of infection.  Remember - BRUSH YOUR TEETH THE MORNING OF SURGERY WITH YOUR REGULAR TOOTHPASTE   [facility]- Preparing For Surgery  Before surgery, you can play an important role. Because skin is not sterile, your skin needs to be as free of germs as possible. You can reduce the number of germs on  your skin by washing with CHG (chlorahexidine gluconate) Soap before surgery.  CHG is an antiseptic cleaner which kills germs and bonds with the skin to continue killing germs even after washing.     Please do not use if you have an allergy to CHG or antibacterial soaps. If your skin becomes reddened/irritated stop using the CHG.  Do not shave (including legs and underarms) for at least 48 hours prior to first CHG shower. It is OK to shave your face.  Please follow these instructions carefully.     Shower the NIGHT BEFORE SURGERY and the MORNING OF SURGERY  with CHG Soap.   If you chose to wash your hair, wash your hair first as usual with your normal shampoo. After you shampoo, rinse your hair and body thoroughly to remove the shampoo.  Then [employer] and genitals (private parts) with your normal soap and rinse thoroughly to remove soap.  After that Use CHG Soap as you would any other liquid soap. You can apply CHG directly to the skin and wash gently with a scrungie or a clean washcloth.   Apply the CHG Soap to your body ONLY FROM THE NECK DOWN.  Do not use on open wounds or open sores. Avoid contact with your eyes, ears, mouth and genitals (private parts). Wash Face and genitals (private parts)  with your normal soap.   Wash thoroughly, paying special attention to the area where your surgery will be performed.  Thoroughly rinse your body with warm water from the neck down.  DO NOT shower/wash with your normal soap after using and rinsing off the CHG Soap.  Pat yourself dry with a CLEAN TOWEL.  Wear CLEAN PAJAMAS to bed the night before surgery  Place CLEAN SHEETS on your bed the night before your surgery  DO NOT SLEEP WITH PETS.   Day of Surgery:  Take a shower with CHG soap. Wear Clean/Comfortable clothing the morning of surgery Do not apply any deodorants/lotions.   Remember to brush your teeth WITH YOUR REGULAR TOOTHPASTE.    COVID testing  If you are going to stay overnight or be admitted after your procedure/surgery and require a pre-op COVID test, please follow these instructions after your COVID test   You are not required to quarantine however you are required to wear a well-fitting mask when you are out and around people not in your household.  If your mask becomes wet or soiled, replace with a new one.  Wash your hands often with soap and water for 20 seconds or clean your hands with an alcohol-based hand sanitizer that contains at least 60% alcohol.  Do not share personal items.  Notify your provider: if you are  in close contact with someone who has COVID  or if you develop a fever of 100.4 or greater, sneezing, cough, sore throat, shortness of breath or body aches.    Please read over the following fact sheets that you were given.

## 2022-01-01 ENCOUNTER — Ambulatory Visit: Payer: No Typology Code available for payment source | Admitting: Orthopedic Surgery

## 2022-01-01 ENCOUNTER — Encounter: Payer: Self-pay | Admitting: Orthopedic Surgery

## 2022-01-01 ENCOUNTER — Ambulatory Visit (HOSPITAL_BASED_OUTPATIENT_CLINIC_OR_DEPARTMENT_OTHER)
Admission: RE | Admit: 2022-01-01 | Discharge: 2022-01-01 | Disposition: A | Discharge status: Home / Self Care | Payer: No Typology Code available for payment source | Source: Ambulatory Visit | Attending: Orthopedic Surgery | Admitting: Orthopedic Surgery

## 2022-01-01 ENCOUNTER — Other Ambulatory Visit: Payer: Self-pay

## 2022-01-01 DIAGNOSIS — Z96652 Presence of left artificial knee joint: Secondary | ICD-10-CM

## 2022-01-01 LAB — SARS CORONAVIRUS 2 (TAT 6-24 HRS): SARS Coronavirus 2: NEGATIVE
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
## 2022-01-01 NOTE — Progress Notes (Signed)
Post-Op Visit Note   Patient: Rickey Cochran           Date of Birth: [birth year]           MRN: [id number] Visit Date: 01/01/2022 PCP: Janith Lima, MD   Assessment & Plan:  Chief Complaint:  Chief Complaint  Patient presents with   Left Leg - Pain    Pain-wants to be checked for DVT   Visit Diagnoses:  1. Status post left knee replacement     Plan: Patient presents now status post left total knee replacement 11/12/2021.  Had some acute onset of calf pain yesterday.  He has been on aspirin for DVT prophylaxis.  He is scheduled for back surgery Monday morning.  His pain is significantly improved today.  On examination he maintains excellent range of motion in the knee.  Minimal calf tenderness today and not too much swelling.  His tenderness yesterday was right in the mid body of the calf.  No posterior popliteal tenderness.  Plan at this time is ultrasound rule out DVT left lower extremity.  Back surgery pending for Monday and it will be extensive.  Need to rule out DVT prior to that procedure.  Follow-Up Instructions: No follow-ups on file.   Orders:  Orders Placed This Encounter  Procedures   VAS [place] LOWER EXTREMITY VENOUS (DVT)   No orders of the defined types were placed in this encounter.   Imaging: VAS [place] LOWER EXTREMITY VENOUS (DVT)  Result Date: 01/01/2022  Lower Venous DVT Study Patient Name:  Rickey Cochran [facility]  Date of Exam:   01/01/2022 Medical Rec #: [id number]          Accession #:    [id number] Date of Birth: 01/11/1961          Patient Gender: M Patient Age:   61 years Exam Location:  [facility] Procedure:      VAS [place] LOWER EXTREMITY VENOUS (DVT) Referring Phys: Marcene Duos --------------------------------------------------------------------------------  Indications: Left calf pain, s/p knee surgery.  Comparison Study: No prior studies. Performing Technologist: Darlin Coco RDMS, RVT  Examination Guidelines: A complete evaluation includes B-mode  imaging, spectral Doppler, color Doppler, and power Doppler as needed of all accessible portions of each vessel. Bilateral testing is considered an integral part of a complete examination. Limited examinations for reoccurring indications may be performed as noted. The reflux portion of the exam is performed with the patient in reverse Trendelenburg.  +-----+---------------+---------+-----------+----------+--------------+  RIGHT Compressibility Phasicity Spontaneity Properties Thrombus Aging  +-----+---------------+---------+-----------+----------+--------------+  CFV   Full            Yes       Yes                                    +-----+---------------+---------+-----------+----------+--------------+   +---------+---------------+---------+-----------+----------+--------------+  LEFT      Compressibility Phasicity Spontaneity Properties Thrombus Aging  +---------+---------------+---------+-----------+----------+--------------+  CFV       Full            Yes       Yes                                    +---------+---------------+---------+-----------+----------+--------------+  SFJ       Full                                                             +---------+---------------+---------+-----------+----------+--------------+  FV Prox   Full                                                             +---------+---------------+---------+-----------+----------+--------------+  FV Mid    Full                                                             +---------+---------------+---------+-----------+----------+--------------+  FV Distal Full                                                             +---------+---------------+---------+-----------+----------+--------------+  PFV       Full                                                             +---------+---------------+---------+-----------+----------+--------------+  POP       Full            Yes       Yes                                     +---------+---------------+---------+-----------+----------+--------------+  PTV       Full                                                             +---------+---------------+---------+-----------+----------+--------------+  PERO      Full                                                             +---------+---------------+---------+-----------+----------+--------------+  Gastroc   Full                                                             +---------+---------------+---------+-----------+----------+--------------+     Summary: RIGHT: - No evidence of common femoral vein obstruction.  LEFT: - There is no evidence of deep vein thrombosis in the lower extremity.  - Heterogenous cystic collection observed at popliteal fossa measuring 3.4 cm. Patient endorses history of recurrent Baker's cyst.  *See table(s) above for measurements and observations.    Preliminary  PMFS History: Patient Active Problem List   Diagnosis Date Noted   OA (osteoarthritis) of knee 11/12/2021   S/P TKR (total knee replacement), left 11/12/2021   NSVT (nonsustained ventricular tachycardia) 05/20/2021   Paroxysmal atrial fibrillation ([place]) 05/20/2021   Pleurodynia 03/23/2021   Multiple fractures of ribs, right side, subsequent encounter for fracture with delayed healing 03/18/2021   Non-toxic multinodular goiter 02/25/2021   Nontoxic single thyroid nodule 02/25/2021   Personal history of malignant neoplasm of thyroid 02/25/2021   Hypothyroidism 02/25/2021   Thyroid cancer ([place]) 02/25/2021   Screen for colon cancer 02/20/2021   Chronic sinus bradycardia 02/17/2021   Primary osteoarthritis of both knees 02/17/2021   Primary osteoarthritis of both feet 02/17/2021   Primary osteoarthritis of left knee 09/23/2020   Routine general medical examination at health care facility 01/29/2020   Premature atrial contraction    BPH associated with nocturia 05/23/2019   Depression with anxiety 05/23/2019   Postoperative  hypothyroidism 05/23/2019   Pes planus of both feet 09/11/2015   Past Medical History:  Diagnosis Date   Arthritis    Cancer ([place])    possible thyroid cancer, now s/p partial thyroidectomy    Depression    Dysrhythmia    Hypertension    several years ago was on lisiopril but lost weight and no longer needs   Hypothyroidism    mgd on levothyroxine    Left-sided tinnitus    Premature atrial contraction    was seen on EKG at his PCP approx 8 years ago, denies any symptoms, reports he was a runner before onset of hip pain in feb 2020   Sleep apnea     History reviewed. No pertinent family history.  Past Surgical History:  Procedure Laterality Date   COLONOSCOPY     IR THORACENTESIS ASP PLEURAL SPACE W/IMG GUIDE  03/20/2021   JOINT REPLACEMENT Right 04/13/2019   right total hip arthroplasty   THYROID LOBECTOMY Right 05/09/2018   THYROID LOBECTOMY Right 05/09/2018   Procedure: RIGHT THYROID LOBECTOMY;  Surgeon: Ralene Ok, MD;  Location: [place];  Service: General;  Laterality: Right;   TOTAL HIP ARTHROPLASTY Right 04/13/2019   Procedure: RIGHT TOTAL HIP ARTHROPLASTY ANTERIOR APPROACH;  Surgeon: Mcarthur Rossetti, MD;  [facility];  Service: Orthopedics;  Laterality: Right;   TOTAL KNEE ARTHROPLASTY Left 11/12/2021   Procedure: LEFT TOTAL KNEE ARTHROPLASTY;  Surgeon: Meredith Pel, MD;  Location: [facility];  Service: Orthopedics;  Laterality: Left;   Social History   Occupational History    Comment: Self employed  Tobacco Use   Smoking status: Former   Smokeless tobacco: Never   Tobacco comments:    QUIT IN 2004  Vaping Use   Vaping Use: Never used  Substance and Sexual Activity   Alcohol use: Not Currently    Alcohol/week: 0.0 standard drinks    Comment: quit   Drug use: Never   Sexual activity: Not on file

## 2022-01-01 NOTE — Anesthesia Preprocedure Evaluation (Addendum)
Anesthesia Evaluation  Patient identified by MRN, date of birth, ID band Patient awake    Reviewed: Allergy & Precautions, NPO status , Patient's Chart, lab work & pertinent test results  Airway Mallampati: II  TM Distance: >3 FB Neck ROM: Full    Dental  (+) Dental Advisory Given   Pulmonary sleep apnea , former smoker,    breath sounds clear to auscultation       Cardiovascular hypertension, Pt. on home beta blockers + dysrhythmias  Rhythm:Regular Rate:Normal     Neuro/Psych negative neurological ROS     GI/Hepatic negative GI ROS, Neg liver ROS,   Endo/Other  Hypothyroidism   Renal/GU negative Renal ROS     Musculoskeletal  (+) Arthritis ,   Abdominal   Peds  Hematology negative hematology ROS (+)   Anesthesia Other Findings   Reproductive/Obstetrics                            Lab Results  Component Value Date   WBC 8.4 12/31/2021   HGB 15.0 12/31/2021   HCT 43.6 12/31/2021   MCV 93.6 12/31/2021   PLT 288 12/31/2021   Lab Results  Component Value Date   CREATININE 0.84 12/31/2021   BUN 14 12/31/2021   NA 140 12/31/2021   K 4.3 12/31/2021   CL 104 12/31/2021   CO2 27 12/31/2021    Anesthesia Physical Anesthesia Plan  ASA: 2  Anesthesia Plan: General   Post-op Pain Management: Tylenol PO (pre-op), Ketamine IV and Gabapentin PO (pre-op)   Induction:   PONV Risk Score and Plan: 2 and Dexamethasone, Ondansetron and Treatment may vary due to age or medical condition  Airway Management Planned: Oral ETT  Additional Equipment: None  Intra-op Plan:   Post-operative Plan: Extubation in OR  Informed Consent: I have reviewed the patients History and Physical, chart, labs and discussed the procedure including the risks, benefits and alternatives for the proposed anesthesia with the patient or authorized representative who has indicated his/her understanding and acceptance.      Dental advisory given  Plan Discussed with:   Anesthesia Plan Comments:       Anesthesia Quick Evaluation

## 2022-01-04 ENCOUNTER — Inpatient Hospital Stay (HOSPITAL_COMMUNITY): Payer: No Typology Code available for payment source | Admitting: Physician Assistant

## 2022-01-04 ENCOUNTER — Inpatient Hospital Stay (HOSPITAL_COMMUNITY)
Admission: RE | Disposition: A | Discharge status: Home / Self Care | Payer: Self-pay | Source: Home / Self Care | Attending: Neurological Surgery

## 2022-01-04 ENCOUNTER — Inpatient Hospital Stay (HOSPITAL_COMMUNITY)
Admission: RE | Admit: 2022-01-04 | Discharge: 2022-01-05 | DRG: 455 | Disposition: A | Discharge status: Home / Self Care | Payer: No Typology Code available for payment source | Attending: Neurological Surgery | Admitting: Neurological Surgery

## 2022-01-04 ENCOUNTER — Other Ambulatory Visit: Payer: Self-pay

## 2022-01-04 ENCOUNTER — Inpatient Hospital Stay (HOSPITAL_COMMUNITY): Payer: No Typology Code available for payment source

## 2022-01-04 ENCOUNTER — Encounter (HOSPITAL_COMMUNITY): Payer: Self-pay | Admitting: Neurological Surgery

## 2022-01-04 DIAGNOSIS — M4156 Other secondary scoliosis, lumbar region: Secondary | ICD-10-CM | POA: Diagnosis present

## 2022-01-04 DIAGNOSIS — Z419 Encounter for procedure for purposes other than remedying health state, unspecified: Secondary | ICD-10-CM

## 2022-01-04 DIAGNOSIS — Z87891 Personal history of nicotine dependence: Secondary | ICD-10-CM

## 2022-01-04 DIAGNOSIS — I10 Essential (primary) hypertension: Secondary | ICD-10-CM | POA: Diagnosis present

## 2022-01-04 DIAGNOSIS — E039 Hypothyroidism, unspecified: Secondary | ICD-10-CM | POA: Diagnosis present

## 2022-01-04 DIAGNOSIS — M4726 Other spondylosis with radiculopathy, lumbar region: Secondary | ICD-10-CM | POA: Diagnosis present

## 2022-01-04 DIAGNOSIS — M1990 Unspecified osteoarthritis, unspecified site: Secondary | ICD-10-CM | POA: Diagnosis present

## 2022-01-04 DIAGNOSIS — M47817 Spondylosis without myelopathy or radiculopathy, lumbosacral region: Secondary | ICD-10-CM | POA: Diagnosis present

## 2022-01-04 DIAGNOSIS — M48062 Spinal stenosis, lumbar region with neurogenic claudication: Secondary | ICD-10-CM

## 2022-01-04 DIAGNOSIS — Z96641 Presence of right artificial hip joint: Secondary | ICD-10-CM | POA: Diagnosis present

## 2022-01-04 DIAGNOSIS — M5116 Intervertebral disc disorders with radiculopathy, lumbar region: Secondary | ICD-10-CM | POA: Diagnosis present

## 2022-01-04 LAB — ABO/RH: ABO/RH(D): A POS

## 2022-01-04 SURGERY — POSTERIOR LUMBAR FUSION 3 LEVEL
Anesthesia: General | Site: Back

## 2022-01-04 MED ORDER — ACETAMINOPHEN 500 MG PO TABS: 1000.0000 mg | ORAL_TABLET | Freq: Four times a day (QID) | ORAL | Status: DC | PRN

## 2022-01-04 MED ORDER — CHLORHEXIDINE GLUCONATE CLOTH 2 % EX PADS: 6.0000 | MEDICATED_PAD | Freq: Once | CUTANEOUS | Status: DC

## 2022-01-04 MED ORDER — CHLORHEXIDINE GLUCONATE 0.12 % MT SOLN
15.0000 mL | Freq: Once | OROMUCOSAL | Status: AC
  Administered 2022-01-04: 15 mL via OROMUCOSAL
  Filled 2022-01-04: qty 15

## 2022-01-04 MED ORDER — FENTANYL CITRATE (PF) 250 MCG/5ML IJ SOLN
INTRAMUSCULAR | Status: AC
  Filled 2022-01-04: qty 5

## 2022-01-04 MED ORDER — PHENOL 1.4 % MT LIQD: 1.0000 | OROMUCOSAL | Status: DC | PRN

## 2022-01-04 MED ORDER — ONDANSETRON HCL 4 MG/2ML IJ SOLN
INTRAMUSCULAR | Status: DC | PRN
Start: 2022-01-04 — End: 2022-01-04
  Administered 2022-01-04: 4 mg via INTRAVENOUS

## 2022-01-04 MED ORDER — KETOROLAC TROMETHAMINE 15 MG/ML IJ SOLN
15.0000 mg | Freq: Four times a day (QID) | INTRAMUSCULAR | Status: DC
  Administered 2022-01-04 – 2022-01-05 (×3): 15 mg via INTRAVENOUS
  Filled 2022-01-04 (×3): qty 1

## 2022-01-04 MED ORDER — TRAMADOL HCL 50 MG PO TABS
50.0000 mg | ORAL_TABLET | Freq: Four times a day (QID) | ORAL | Status: DC | PRN
Start: 2022-01-04 — End: 2022-01-04

## 2022-01-04 MED ORDER — THROMBIN 5000 UNITS EX SOLR
CUTANEOUS | Status: AC
  Filled 2022-01-04: qty 5000

## 2022-01-04 MED ORDER — BISACODYL 10 MG RE SUPP: 10.0000 mg | Freq: Every day | RECTAL | Status: DC | PRN

## 2022-01-04 MED ORDER — PHENYLEPHRINE 40 MCG/ML (10ML) SYRINGE FOR IV PUSH (FOR BLOOD PRESSURE SUPPORT)
PREFILLED_SYRINGE | INTRAVENOUS | Status: DC | PRN
Start: 2022-01-04 — End: 2022-01-04
  Administered 2022-01-04: 120 ug via INTRAVENOUS
  Administered 2022-01-04 (×2): 80 ug via INTRAVENOUS
  Administered 2022-01-04: 120 ug via INTRAVENOUS

## 2022-01-04 MED ORDER — KETAMINE HCL 10 MG/ML IJ SOLN
INTRAMUSCULAR | Status: DC | PRN
  Administered 2022-01-04 (×2): 10 mg via INTRAVENOUS
  Administered 2022-01-04: 20 mg via INTRAVENOUS
  Administered 2022-01-04: 10 mg via INTRAVENOUS

## 2022-01-04 MED ORDER — ALUM & MAG HYDROXIDE-SIMETH 200-200-20 MG/5ML PO SUSP: 30.0000 mL | Freq: Four times a day (QID) | ORAL | Status: DC | PRN

## 2022-01-04 MED ORDER — KETOROLAC TROMETHAMINE 15 MG/ML IJ SOLN
INTRAMUSCULAR | Status: AC
  Filled 2022-01-04: qty 1

## 2022-01-04 MED ORDER — FENTANYL CITRATE (PF) 250 MCG/5ML IJ SOLN
INTRAMUSCULAR | Status: DC | PRN
  Administered 2022-01-04: 50 ug via INTRAVENOUS
  Administered 2022-01-04: 100 ug via INTRAVENOUS
  Administered 2022-01-04 (×7): 50 ug via INTRAVENOUS

## 2022-01-04 MED ORDER — MORPHINE SULFATE (PF) 2 MG/ML IV SOLN: 2.0000 mg | INTRAVENOUS | Status: DC | PRN

## 2022-01-04 MED ORDER — 0.9 % SODIUM CHLORIDE (POUR BTL) OPTIME
TOPICAL | Status: DC | PRN
  Administered 2022-01-04: 1000 mL

## 2022-01-04 MED ORDER — SENNA 8.6 MG PO TABS
1.0000 | ORAL_TABLET | Freq: Two times a day (BID) | ORAL | Status: DC
  Administered 2022-01-04 – 2022-01-05 (×2): 8.6 mg via ORAL
  Filled 2022-01-04 (×3): qty 1

## 2022-01-04 MED ORDER — ROCURONIUM BROMIDE 10 MG/ML (PF) SYRINGE
PREFILLED_SYRINGE | INTRAVENOUS | Status: AC
  Filled 2022-01-04: qty 20

## 2022-01-04 MED ORDER — ALBUMIN HUMAN 5 % IV SOLN
12.5000 g | Freq: Once | INTRAVENOUS | Status: AC
  Administered 2022-01-04: 12.5 g via INTRAVENOUS

## 2022-01-04 MED ORDER — OXYCODONE-ACETAMINOPHEN 5-325 MG PO TABS: 1.0000 | ORAL_TABLET | ORAL | Status: DC | PRN

## 2022-01-04 MED ORDER — ONDANSETRON HCL 4 MG PO TABS
4.0000 mg | ORAL_TABLET | Freq: Four times a day (QID) | ORAL | Status: DC | PRN
  Administered 2022-01-05: 4 mg via ORAL
  Filled 2022-01-04: qty 1

## 2022-01-04 MED ORDER — DEXAMETHASONE SODIUM PHOSPHATE 10 MG/ML IJ SOLN
INTRAMUSCULAR | Status: DC | PRN
Start: 2022-01-04 — End: 2022-01-04
  Administered 2022-01-04: 10 mg via INTRAVENOUS

## 2022-01-04 MED ORDER — POLYETHYLENE GLYCOL 3350 17 G PO PACK: 17.0000 g | PACK | Freq: Every day | ORAL | Status: DC | PRN

## 2022-01-04 MED ORDER — EPHEDRINE 5 MG/ML INJ
INTRAVENOUS | Status: AC
  Filled 2022-01-04: qty 5

## 2022-01-04 MED ORDER — CEFAZOLIN SODIUM-DEXTROSE 2-4 GM/100ML-% IV SOLN
2.0000 g | INTRAVENOUS | Status: AC
  Administered 2022-01-04 (×2): 2 g via INTRAVENOUS
  Filled 2022-01-04: qty 100

## 2022-01-04 MED ORDER — ACETAMINOPHEN 10 MG/ML IV SOLN
INTRAVENOUS | Status: DC | PRN
  Administered 2022-01-04: 1000 mg via INTRAVENOUS

## 2022-01-04 MED ORDER — PHENYLEPHRINE HCL-NACL 20-0.9 MG/250ML-% IV SOLN
INTRAVENOUS | Status: DC | PRN
Start: 2022-01-04 — End: 2022-01-04
  Administered 2022-01-04: 40 ug/min via INTRAVENOUS
  Administered 2022-01-04 (×2): 60 ug/min via INTRAVENOUS

## 2022-01-04 MED ORDER — LIDOCAINE 2% (20 MG/ML) 5 ML SYRINGE
INTRAMUSCULAR | Status: DC | PRN
  Administered 2022-01-04: 60 mg via INTRAVENOUS

## 2022-01-04 MED ORDER — LIDOCAINE-EPINEPHRINE 1 %-1:100000 IJ SOLN
INTRAMUSCULAR | Status: DC | PRN
  Administered 2022-01-04: 10 mL

## 2022-01-04 MED ORDER — ONDANSETRON HCL 4 MG/2ML IJ SOLN: 4.0000 mg | Freq: Four times a day (QID) | INTRAMUSCULAR | Status: DC | PRN

## 2022-01-04 MED ORDER — ALFUZOSIN HCL ER 10 MG PO TB24
10.0000 mg | ORAL_TABLET | Freq: Every day | ORAL | Status: DC
  Administered 2022-01-05: 10 mg via ORAL
  Filled 2022-01-04: qty 1

## 2022-01-04 MED ORDER — LACTATED RINGERS IV SOLN: INTRAVENOUS | Status: DC

## 2022-01-04 MED ORDER — SODIUM CHLORIDE 0.9% FLUSH: 3.0000 mL | INTRAVENOUS | Status: DC | PRN

## 2022-01-04 MED ORDER — ROCURONIUM BROMIDE 10 MG/ML (PF) SYRINGE
PREFILLED_SYRINGE | INTRAVENOUS | Status: AC
  Filled 2022-01-04: qty 10

## 2022-01-04 MED ORDER — SODIUM CHLORIDE 0.9 % IV SOLN: INTRAVENOUS | Status: DC | PRN

## 2022-01-04 MED ORDER — OXYCODONE-ACETAMINOPHEN 5-325 MG PO TABS
1.0000 | ORAL_TABLET | ORAL | Status: DC | PRN
  Administered 2022-01-05: 2 via ORAL
  Filled 2022-01-04: qty 2

## 2022-01-04 MED ORDER — BUPIVACAINE HCL (PF) 0.5 % IJ SOLN
INTRAMUSCULAR | Status: DC | PRN
  Administered 2022-01-04: 10 mL
  Administered 2022-01-04: 20 mL

## 2022-01-04 MED ORDER — FLEET ENEMA 7-19 GM/118ML RE ENEM: 1.0000 | ENEMA | Freq: Once | RECTAL | Status: DC | PRN

## 2022-01-04 MED ORDER — MENTHOL 3 MG MT LOZG: 1.0000 | LOZENGE | OROMUCOSAL | Status: DC | PRN

## 2022-01-04 MED ORDER — CEFAZOLIN SODIUM-DEXTROSE 2-4 GM/100ML-% IV SOLN
2.0000 g | Freq: Three times a day (TID) | INTRAVENOUS | Status: AC
  Administered 2022-01-04 – 2022-01-05 (×2): 2 g via INTRAVENOUS
  Filled 2022-01-04 (×2): qty 100

## 2022-01-04 MED ORDER — PROPOFOL 10 MG/ML IV BOLUS
INTRAVENOUS | Status: DC | PRN
  Administered 2022-01-04: 150 mg via INTRAVENOUS

## 2022-01-04 MED ORDER — METOPROLOL SUCCINATE ER 25 MG PO TB24: 25.0000 mg | ORAL_TABLET | Freq: Every day | ORAL | Status: DC

## 2022-01-04 MED ORDER — LACTATED RINGERS IV SOLN
INTRAVENOUS | Status: DC | PRN
Start: 2022-01-04 — End: 2022-01-04

## 2022-01-04 MED ORDER — DEXAMETHASONE SODIUM PHOSPHATE 10 MG/ML IJ SOLN
INTRAMUSCULAR | Status: AC
  Filled 2022-01-04: qty 1

## 2022-01-04 MED ORDER — ONDANSETRON HCL 4 MG/2ML IJ SOLN
INTRAMUSCULAR | Status: AC
  Filled 2022-01-04: qty 2

## 2022-01-04 MED ORDER — ORAL CARE MOUTH RINSE: 15.0000 mL | Freq: Once | OROMUCOSAL | Status: AC

## 2022-01-04 MED ORDER — MIDAZOLAM HCL 2 MG/2ML IJ SOLN
INTRAMUSCULAR | Status: AC
  Filled 2022-01-04: qty 2

## 2022-01-04 MED ORDER — SODIUM CHLORIDE 0.9 % IV SOLN: 250.0000 mL | INTRAVENOUS | Status: DC

## 2022-01-04 MED ORDER — SUGAMMADEX SODIUM 200 MG/2ML IV SOLN
INTRAVENOUS | Status: DC | PRN
  Administered 2022-01-04: 300 mg via INTRAVENOUS

## 2022-01-04 MED ORDER — AMISULPRIDE (ANTIEMETIC) 5 MG/2ML IV SOLN: 10.0000 mg | Freq: Once | INTRAVENOUS | Status: DC | PRN

## 2022-01-04 MED ORDER — SODIUM CHLORIDE 0.9% FLUSH
3.0000 mL | Freq: Two times a day (BID) | INTRAVENOUS | Status: DC
  Administered 2022-01-04: 3 mL via INTRAVENOUS

## 2022-01-04 MED ORDER — LEVOTHYROXINE SODIUM 88 MCG PO TABS
88.0000 ug | ORAL_TABLET | Freq: Every day | ORAL | Status: DC
  Administered 2022-01-05: 88 ug via ORAL
  Filled 2022-01-04: qty 1

## 2022-01-04 MED ORDER — BUPIVACAINE HCL (PF) 0.5 % IJ SOLN
INTRAMUSCULAR | Status: AC
  Filled 2022-01-04: qty 30

## 2022-01-04 MED ORDER — PHENYLEPHRINE 40 MCG/ML (10ML) SYRINGE FOR IV PUSH (FOR BLOOD PRESSURE SUPPORT)
PREFILLED_SYRINGE | INTRAVENOUS | Status: AC
  Filled 2022-01-04: qty 10

## 2022-01-04 MED ORDER — METHOCARBAMOL 500 MG PO TABS
500.0000 mg | ORAL_TABLET | Freq: Four times a day (QID) | ORAL | Status: DC | PRN
  Administered 2022-01-04 – 2022-01-05 (×3): 500 mg via ORAL
  Filled 2022-01-04 (×3): qty 1

## 2022-01-04 MED ORDER — PROPOFOL 10 MG/ML IV BOLUS
INTRAVENOUS | Status: AC
  Filled 2022-01-04: qty 20

## 2022-01-04 MED ORDER — EPHEDRINE SULFATE-NACL 50-0.9 MG/10ML-% IV SOSY
PREFILLED_SYRINGE | INTRAVENOUS | Status: DC | PRN
Start: 2022-01-04 — End: 2022-01-04
  Administered 2022-01-04: 5 mg via INTRAVENOUS

## 2022-01-04 MED ORDER — DOCUSATE SODIUM 100 MG PO CAPS
100.0000 mg | ORAL_CAPSULE | Freq: Two times a day (BID) | ORAL | Status: DC
  Administered 2022-01-04 – 2022-01-05 (×2): 100 mg via ORAL
  Filled 2022-01-04 (×3): qty 1

## 2022-01-04 MED ORDER — ROCURONIUM BROMIDE 10 MG/ML (PF) SYRINGE
PREFILLED_SYRINGE | INTRAVENOUS | Status: DC | PRN
  Administered 2022-01-04 (×2): 50 mg via INTRAVENOUS
  Administered 2022-01-04: 60 mg via INTRAVENOUS
  Administered 2022-01-04: 40 mg via INTRAVENOUS
  Administered 2022-01-04 (×2): 50 mg via INTRAVENOUS

## 2022-01-04 MED ORDER — METHOCARBAMOL 1000 MG/10ML IJ SOLN
500.0000 mg | Freq: Four times a day (QID) | INTRAVENOUS | Status: DC | PRN
  Filled 2022-01-04: qty 5

## 2022-01-04 MED ORDER — ACETAMINOPHEN 500 MG PO TABS
1000.0000 mg | ORAL_TABLET | Freq: Once | ORAL | Status: AC
  Administered 2022-01-04: 1000 mg via ORAL
  Filled 2022-01-04: qty 2

## 2022-01-04 MED ORDER — MIDAZOLAM HCL 2 MG/2ML IJ SOLN
INTRAMUSCULAR | Status: DC | PRN
Start: 2022-01-04 — End: 2022-01-04
  Administered 2022-01-04: 2 mg via INTRAVENOUS

## 2022-01-04 MED ORDER — THROMBIN 20000 UNITS EX SOLR
CUTANEOUS | Status: AC
  Filled 2022-01-04: qty 20000

## 2022-01-04 MED ORDER — SERTRALINE HCL 50 MG PO TABS
100.0000 mg | ORAL_TABLET | Freq: Every day | ORAL | Status: DC
  Administered 2022-01-04: 100 mg via ORAL
  Filled 2022-01-04: qty 2

## 2022-01-04 MED ORDER — THROMBIN 5000 UNITS EX SOLR
OROMUCOSAL | Status: DC | PRN
  Administered 2022-01-04 (×2): 5 mL via TOPICAL

## 2022-01-04 MED ORDER — THROMBIN 20000 UNITS EX SOLR
CUTANEOUS | Status: DC | PRN
  Administered 2022-01-04: 20 mL via TOPICAL

## 2022-01-04 MED ORDER — LIDOCAINE-EPINEPHRINE 1 %-1:100000 IJ SOLN
INTRAMUSCULAR | Status: AC
  Filled 2022-01-04: qty 1

## 2022-01-04 MED ORDER — ACETAMINOPHEN 650 MG RE SUPP: 650.0000 mg | RECTAL | Status: DC | PRN

## 2022-01-04 MED ORDER — LIDOCAINE 2% (20 MG/ML) 5 ML SYRINGE
INTRAMUSCULAR | Status: AC
  Filled 2022-01-04: qty 5

## 2022-01-04 MED ORDER — ACETAMINOPHEN 325 MG PO TABS
650.0000 mg | ORAL_TABLET | ORAL | Status: DC | PRN
  Administered 2022-01-05: 650 mg via ORAL
  Filled 2022-01-04: qty 2

## 2022-01-04 MED ORDER — TRAMADOL HCL 50 MG PO TABS: 50.0000 mg | ORAL_TABLET | Freq: Four times a day (QID) | ORAL | Status: DC | PRN

## 2022-01-04 MED ORDER — HYDROMORPHONE HCL 1 MG/ML IJ SOLN: 0.2500 mg | INTRAMUSCULAR | Status: DC | PRN

## 2022-01-04 MED ORDER — GABAPENTIN 300 MG PO CAPS
ORAL_CAPSULE | ORAL | Status: AC
  Administered 2022-01-04: 300 mg
  Filled 2022-01-04: qty 1

## 2022-01-04 MED ORDER — TRAMADOL HCL 50 MG PO TABS
50.0000 mg | ORAL_TABLET | Freq: Four times a day (QID) | ORAL | Status: DC | PRN
  Administered 2022-01-04 – 2022-01-05 (×3): 100 mg via ORAL
  Filled 2022-01-04 (×3): qty 2

## 2022-01-04 MED ORDER — ALBUMIN HUMAN 5 % IV SOLN
INTRAVENOUS | Status: AC
  Filled 2022-01-04: qty 250

## 2022-01-04 SURGICAL SUPPLY — 68 items
BAG COUNTER SPONGE SURGICOUNT (BAG) ×3 IMPLANT
BASKET BONE COLLECTION (BASKET) ×2 IMPLANT
BLADE CLIPPER SURG (BLADE) ×1 IMPLANT
BONE CANC CHIPS 40CC CAN1/2 (Bone Implant) ×2 IMPLANT
BUR MATCHSTICK NEURO 3.0 LAGG (BURR) ×2 IMPLANT
CAGE COROENT LG 10X9X23-12 (Cage) ×1 IMPLANT
CAGE COROENT MP 8X9X23M-8 SPIN (Cage) ×1 IMPLANT
CANISTER SUCT 3000ML PPV (MISCELLANEOUS) ×2 IMPLANT
CHIPS CANC BONE 40CC CAN1/2 (Bone Implant) ×1 IMPLANT
CNTNR URN SCR LID CUP LEK RST (MISCELLANEOUS) ×1 IMPLANT
CONT SPEC 4OZ STRL OR WHT (MISCELLANEOUS) ×1
COVER BACK TABLE 60X90IN (DRAPES) ×2 IMPLANT
DECANTER SPIKE VIAL GLASS SM (MISCELLANEOUS) ×1 IMPLANT
DERMABOND ADVANCED (GAUZE/BANDAGES/DRESSINGS) ×1
DERMABOND ADVANCED .7 DNX12 (GAUZE/BANDAGES/DRESSINGS) ×1 IMPLANT
DEVICE DISSECT PLASMABLAD 3.0S (MISCELLANEOUS) ×1 IMPLANT
DRAPE C-ARM 42X72 X-RAY (DRAPES) ×4 IMPLANT
DRAPE HALF SHEET 40X57 (DRAPES) ×1 IMPLANT
DRAPE LAPAROTOMY 100X72X124 (DRAPES) ×2 IMPLANT
DRSG OPSITE POSTOP 4X8 (GAUZE/BANDAGES/DRESSINGS) ×1 IMPLANT
DURAPREP 26ML APPLICATOR (WOUND CARE) ×2 IMPLANT
DURASEAL APPLICATOR TIP (TIP) IMPLANT
DURASEAL SPINE SEALANT 3ML (MISCELLANEOUS) IMPLANT
ELECT REM PT RETURN 9FT ADLT (ELECTROSURGICAL) ×2
ELECTRODE REM PT RTRN 9FT ADLT (ELECTROSURGICAL) ×1 IMPLANT
GAUZE 4X4 16PLY ~~LOC~~+RFID DBL (SPONGE) ×1 IMPLANT
GAUZE SPONGE 4X4 12PLY STRL (GAUZE/BANDAGES/DRESSINGS) ×1 IMPLANT
GLOVE SURG LTX SZ8.5 (GLOVE) ×4 IMPLANT
GLOVE SURG POLYISO LF SZ7 (GLOVE) ×4 IMPLANT
GLOVE SURG POLYISO LF SZ7.5 (GLOVE) ×1 IMPLANT
GLOVE SURG UNDER POLY LF SZ7.5 (GLOVE) ×1 IMPLANT
GLOVE SURG UNDER POLY LF SZ8.5 (GLOVE) ×4 IMPLANT
GOWN STRL REUS W/ TWL LRG LVL3 (GOWN DISPOSABLE) IMPLANT
GOWN STRL REUS W/ TWL XL LVL3 (GOWN DISPOSABLE) IMPLANT
GOWN STRL REUS W/TWL 2XL LVL3 (GOWN DISPOSABLE) ×4 IMPLANT
GOWN STRL REUS W/TWL LRG LVL3 (GOWN DISPOSABLE) ×5
GOWN STRL REUS W/TWL XL LVL3 (GOWN DISPOSABLE)
GRAFT BNE CHIP CANC 1-8 40 (Bone Implant) IMPLANT
GRAFT BONE PROTEIOS XL 10CC (Orthopedic Implant) ×1 IMPLANT
HEMOSTAT POWDER KIT SURGIFOAM (HEMOSTASIS) ×2 IMPLANT
KIT BASIN OR (CUSTOM PROCEDURE TRAY) ×2 IMPLANT
KIT GRAFTMAG DEL NEURO DISP (NEUROSURGERY SUPPLIES) ×1 IMPLANT
KIT TURNOVER KIT B (KITS) ×2 IMPLANT
MILL MEDIUM DISP (BLADE) ×2 IMPLANT
NEEDLE HYPO 22GX1.5 SAFETY (NEEDLE) ×2 IMPLANT
NS IRRIG 1000ML POUR BTL (IV SOLUTION) ×2 IMPLANT
PACK LAMINECTOMY NEURO (CUSTOM PROCEDURE TRAY) ×2 IMPLANT
PAD ARMBOARD 7.5X6 YLW CONV (MISCELLANEOUS) ×5 IMPLANT
PATTIES SURGICAL .5 X1 (DISPOSABLE) ×3 IMPLANT
PLASMABLADE 3.0S (MISCELLANEOUS) ×2
ROD RELINE-O 5.5X100 LORD (Rod) ×1 IMPLANT
ROD RELINE-O LORD 5.5X110MM (Rod) ×1 IMPLANT
SCREW LOCK RELINE 5.5 TULIP (Screw) ×8 IMPLANT
SCREW RELINE-O POLY 6.5X45 (Screw) ×8 IMPLANT
SPONGE SURGIFOAM ABS GEL 100 (HEMOSTASIS) ×2 IMPLANT
SPONGE T-LAP 4X18 ~~LOC~~+RFID (SPONGE) ×2 IMPLANT
SUT PROLENE 6 0 BV (SUTURE) IMPLANT
SUT VIC AB 1 CT1 18XBRD ANBCTR (SUTURE) ×1 IMPLANT
SUT VIC AB 1 CT1 8-18 (SUTURE) ×1
SUT VIC AB 2-0 CP2 18 (SUTURE) ×2 IMPLANT
SUT VIC AB 3-0 SH 8-18 (SUTURE) ×2 IMPLANT
SUT VIC AB 4-0 RB1 18 (SUTURE) ×2 IMPLANT
SYR 3ML LL SCALE MARK (SYRINGE) ×13 IMPLANT
SYR CONTROL 10ML LL (SYRINGE) ×1 IMPLANT
TOWEL GREEN STERILE (TOWEL DISPOSABLE) ×2 IMPLANT
TOWEL GREEN STERILE FF (TOWEL DISPOSABLE) ×2 IMPLANT
TRAY FOLEY MTR SLVR 16FR STAT (SET/KITS/TRAYS/PACK) ×2 IMPLANT
WATER STERILE IRR 1000ML POUR (IV SOLUTION) ×2 IMPLANT

## 2022-01-04 NOTE — Op Note (Signed)
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  Adult scoliosis, degenerative. Postoperative diagnosis: Same Procedure: Posterior lumbar decompression L2-3 L3-4 L4-5 with more work than required for simple interbody technique.  Decompression of the L2 the L3-L4 and L5 nerve roots bilaterally and laterally.  Posterior lumbar interbody arthrodesis with peek spacers local autograft and allograft and Proteus L2-3 and L4-5.  Posterolateral arthrodesis L2-L5 with local autograft allograft and Proteus.  This pedicle screw fixation L2-L5 using fluoroscopic guidance for pedicle screw placement. Surgeon: Kristeen Miss First Assistant: Deatra Ina, MD Anesthesia: General endotracheal Indications: Rickey Cochran is a 61 year old individual whose had significant and worsening back pain and leg pain.  Rickey Cochran has evidence of significant spondylitic stenosis at L2-3 L3-4 and L4-5.  The stenosis at L4-5 is particularly severe and the patient was advised the need for surgical decompression and arthrodesis from L2-L5.  Rickey Cochran is now admitted for this procedure.  Patient was developing early degenerative scoliosis at the time of evaluation with left-sided collapse of the disc space at L2-3 and right-sided collapse of the disc space at L4-L5.  Procedure: The patient was brought to the operating room supine on the stretcher.  After the smooth induction of general endotracheal anesthesia, Rickey Cochran was carefully turned prone.  The back was prepped with alcohol DuraPrep and draped in a sterile fashion.  Midline incision was created and carried down to the lumbodorsal fascia.  First identifiable spinous processes were identified as L3 and L4.  The dissection was then carried inferiorly into the region of L4 and superiorly up to L2 using a subperiosteal dissection.  The self-retaining retractor was placed in the wound and ultimately the facets at  L2 334 and 4 5 were uncovered.  Tenotomy's were then created removing the inferior margin lamina of L2-L3 and L4 on the left side.  This exposed the thickened redundant yellow ligament in these regions.  The dissection then was taken to expose the common dural tube and superiorly the path of the L2 nerve root was cleared and inferiorly the path of the L3 nerve root was cleared at L2-3 and L3-4 similar dissection was carried out L3-4 was noted not to have much movement at all where his L2-3 and L4-5 were noted to be substantially more mobile.  The disc spaces were isolated first on the left side and then on the right side similar laminotomies and decompression was performed once the disc spaces were all isolated on both sides discectomies were then performed at L2-3 and L4-5.  Total discectomies were completed to expose the entirety of the disc space including the endplates.  Then by distracting substantially on the left side at L2-3 we are able to place a spacer measuring 8 x 9 x 23 mm in size with 8 degrees of lordosis this was placed with an additional 18 cc of bone graft into the disc space at L to 3.  This allowed for some reduction of the scoliotic curvature.  Then at L4-5 a similar decompression of the disc space was performed in its entirety and on the right side were able to place a 10 x 9 x 23 mm spacer with 12 degrees lordosis.  This further reduce the right-sided collapse and straighten the spine a total of 21 cc of bone graft was packed into the interspace here.  Once the interbody grafting was performed attention was turned to L3-4 were large bilateral laminotomies were performed trying to access the disc  space was not possible and there was not any motion between the disc space but once a good decompression was obtained it was decided to perform a posterolateral arthrodesis at L3-4 with substantial bone graft into this region in addition to bone graft being packed from L2-L5.  With this pedicle entry  sites were chosen and 6.5 x 45 mm screws were placed in L2-L3-L4 and L5.  The screws were aligned such that on the right side we were able to place a 110 mm precontoured rod and on the left side 100 mm precontoured rod to fit between the saddles in a neutral construct.  The screws were tightened and then the neutral fashion and the remainder of the bone graft for total volume of approximately 15 cc of bone was packed into the lateral gutters.  Stasis in the soft tissues was checked and then care was taken to make sure that each of the individual nerve roots were well decompressed from L2 down to L5.  The lumbodorsal fascia was then closed with #1 Vicryl in interrupted fashion 2-0 Vicryl was used in the subcutaneous tissues 3-0 Vicryl subcuticularly and Dermabond was placed on the skin.  Blood loss for the procedure was estimated at over 500 cc and 200 cc of Cell Saver blood was returned to the patient.

## 2022-01-04 NOTE — Anesthesia Procedure Notes (Signed)
Procedure Name: Intubation Date/Time: 01/04/2022 8:14 AM Performed by: Carolan Clines, CRNA Pre-anesthesia Checklist: Patient identified, Emergency Drugs available, Suction available and Patient being monitored Patient Re-evaluated:Patient Re-evaluated prior to induction Oxygen Delivery Method: Circle System Utilized Preoxygenation: Pre-oxygenation with 100% oxygen Induction Type: IV induction Ventilation: Mask ventilation without difficulty Laryngoscope Size: Mac and 4 Grade View: Grade I Tube type: Oral Tube size: 7.5 mm Number of attempts: 1 Airway Equipment and Method: Stylet Placement Confirmation: ETT inserted through vocal cords under direct vision, positive ETCO2 and breath sounds checked- equal and bilateral Secured at: 23 cm Tube secured with: Tape Dental Injury: Teeth and Oropharynx as per pre-operative assessment

## 2022-01-04 NOTE — H&P (Signed)
CHIEF COMPLAINT: Back pain and lower extremity pain.  HISTORY OF PRESENT ILLNESS: Rickey Cochran is a 61 year old right-handed individual who notes that he has had some back issues for 30 years, but he notes that they have been progressively getting worse.  In the last 6 months, he has noted a substantial change in his capacity to walk distances and to function with his lower extremities.  He has had a total hip replacement on the right and he has seen Dr. Alphonzo Severance in regard to doing a left knee replacement in the next few weeks.  Nonetheless, when he was complaining about his back, Dr. Marlou Sa did some plain x-rays and ultimately an MRI, which demonstrates that the patient has some advanced degenerative changes at L2-3, L3-4, and L4-5, with a degenerative scoliosis that is forming at that level.  The MRI demonstrates that he has some mild lateral recess stenosis on the left side at L2-3, more on the right side at L3-4, and severe central stenosis at L4-5.  Above that at L1-2 and T12, the patient has a very patulous spinal canal with normal-size discs.  He does have some moderate degenerative changes at the L5-S1 disc.  However, the lateral recesses and the nerve roots there have ample room for exit.  The patient notes that he was concerned about having his mother's back, and we discussed this briefly and that his mother, who is a patient of mine, has had a degenerative scoliosis and also had a rather patulous-looking spinal canal, but had developed significant central stenosis at multiple levels.  He notes that his activity level is becoming so limited that it is impeding the activities that he enjoys doing, working on landscape, but also his leisure activities, which include camping, hiking, and mountain bike riding.  He finds that standing up in the kitchen, even to do some simple chores is becoming increasingly difficult.  He finds himself constantly having to lean forward.  He notes that he practices yoga and he  finds that doing any extensor posture such as a cow position or a cobra position or even the sphinx tends to aggravate significant pain in his back and his legs.  PAST MEDICAL HISTORY: Otherwise reveals that he is generally quite healthy and physically active.  PHYSICAL EXAM: He stands straight and erect.  He walks without an antalgia.  However, there is a slight suggestion of some tibialis anterior weakness on the left side, but he notes that this may be coming from the issue that he has with his left knee creating pain.  His gastroc strength appears intact.  His reflexes are 2+ and symmetric in both the patellae and the Achilles at this time.  His station otherwise is also normal.  IMPRESSION/PLAN: The patient has an advanced spondylitic stenosis that is forming with a degenerative scoliosis that is occurring also at L2-3, L3-4, and L4-5.  He has a moderately severe stenosis at the L4-5 level particularly, but he also has lateral recess stenosis at the other 2 levels, and I noted that in order to address this adequately, he would require a 3-level decompression and fusion from L2 to L5.  This would require a straightforward posterior operation with removal of the hypertrophied facets and ligamentous structures, doing a posterior lumbar interbody arthrodesis at each level, L2-3, 3-4, and 4-5, with pedicle screw fixation from L2 to L5 to maintain the alignment of his spine and help reduce some of the scoliosis.  This should stabilize the process and allow him to  resume more normal function.  Noting that he particularly enjoys an active lifestyle, I believe that this should allow him to resume those activities once adequately healed, which may take upwards of 6 months after the surgery.  However, he may start to increase his level of activities usually after about 3 months once we know that the fusion is healing solidly.  We did discuss the nature of the surgery, being in the hospital typically for a 48-hour  period after surgery, with the use of an external corset for the first 2 months postoperatively.  Once we know that the fusion is setting up nicely and healing well, he can start to liberalize his activities.  We will schedule at his convenience, but I did suggest that he proceed with the total knee replacement first to have that issue resolving itself when he plans to have the lumbar spine surgery.

## 2022-01-04 NOTE — Transfer of Care (Signed)
Immediate Anesthesia Transfer of Care Note  Patient: Rickey Cochran  Procedure(s) Performed: Lumbar two-three Lumbar / Lumbar four-five Posterior Lumbar Interbody Fusion, Lumbar two to five posterior lateral arthrodesis  (Back)  Patient Location: PACU  Anesthesia Type:General  Level of Consciousness: drowsy  Airway & Oxygen Therapy: Patient Spontanous Breathing and Patient connected to face mask oxygen  Post-op Assessment: Report given to RN and Post -op Vital signs reviewed and stable  Post vital signs: Reviewed and stable  Last Vitals:  Vitals Value Taken Time  BP 129/80 01/04/22 1505  Temp 36.9 C 01/04/22 1505  Pulse 56 01/04/22 1507  Resp 13 01/04/22 1507  SpO2 97 % 01/04/22 1507  Vitals shown include unvalidated device data.  Last Pain:  Vitals:   01/04/22 0619  TempSrc:   PainSc: 0-No pain         Complications: No notable events documented.

## 2022-01-05 ENCOUNTER — Other Ambulatory Visit (HOSPITAL_COMMUNITY): Payer: Self-pay

## 2022-01-05 LAB — BASIC METABOLIC PANEL
Anion gap: 8 (ref 5–15)
BUN: 14 mg/dL (ref 8–23)
CO2: 26 mmol/L (ref 22–32)
Calcium: 8.4 mg/dL — ABNORMAL LOW (ref 8.9–10.3)
Chloride: 102 mmol/L (ref 98–111)
Creatinine, Ser: 0.87 mg/dL (ref 0.61–1.24)
GFR, Estimated: >60 mL/min (ref >60)
Glucose, Bld: 115 mg/dL — ABNORMAL HIGH (ref 70–99)
Potassium: 3.8 mmol/L (ref 3.5–5.1)
Sodium: 136 mmol/L (ref 135–145)

## 2022-01-05 LAB — CBC
HCT: 33.3 % — ABNORMAL LOW (ref 39.0–52.0)
Hemoglobin: 11.6 g/dL — ABNORMAL LOW (ref 13.0–17.0)
MCH: 32.3 pg (ref 26.0–34.0)
MCHC: 34.8 g/dL (ref 30.0–36.0)
MCV: 92.8 fL (ref 80.0–100.0)
Platelets: 194 10*3/uL (ref 150–400)
RBC: 3.59 MIL/uL — ABNORMAL LOW (ref 4.22–5.81)
RDW: 12.1 % (ref 11.5–15.5)
WBC: 11.3 10*3/uL — ABNORMAL HIGH (ref 4.0–10.5)
nRBC: 0 % (ref 0.0–0.2)

## 2022-01-05 MED ORDER — OXYCODONE-ACETAMINOPHEN 5-325 MG PO TABS
1.0000 | ORAL_TABLET | ORAL | 0 refills | Status: DC | PRN
  Filled 2022-01-05: qty 40, 4d supply, fill #0

## 2022-01-05 MED ORDER — IBUPROFEN 800 MG PO TABS
800.0000 mg | ORAL_TABLET | Freq: Three times a day (TID) | ORAL | 3 refills | Status: DC | PRN
  Filled 2022-01-05: qty 30, 10d supply, fill #0

## 2022-01-05 MED ORDER — ONDANSETRON HCL 4 MG PO TABS
4.0000 mg | ORAL_TABLET | Freq: Four times a day (QID) | ORAL | 0 refills | Status: DC | PRN
  Filled 2022-01-05: qty 30, 8d supply, fill #0

## 2022-01-05 MED ORDER — METHOCARBAMOL 500 MG PO TABS
500.0000 mg | ORAL_TABLET | Freq: Four times a day (QID) | ORAL | 2 refills | Status: DC | PRN
  Filled 2022-01-05: qty 30, 8d supply, fill #0

## 2022-01-05 MED FILL — Thrombin For Soln 5000 Unit: CUTANEOUS | Qty: 5000 | Status: AC

## 2022-01-05 NOTE — Anesthesia Postprocedure Evaluation (Signed)
Anesthesia Post Note  Patient: CORDAY WYKA  Procedure(s) Performed: Lumbar two-three Lumbar / Lumbar four-five Posterior Lumbar Interbody Fusion, Lumbar two to five posterior lateral arthrodesis  (Back)     Patient location during evaluation: PACU Anesthesia Type: General Level of consciousness: awake and alert Pain management: pain level controlled Vital Signs Assessment: post-procedure vital signs reviewed and stable Respiratory status: spontaneous breathing, nonlabored ventilation, respiratory function stable and patient connected to nasal cannula oxygen Cardiovascular status: blood pressure returned to baseline and stable Postop Assessment: no apparent nausea or vomiting Anesthetic complications: no   No notable events documented.  Last Vitals:  Vitals:   01/05/22 0306 01/05/22 0739  BP: 130/73 131/76  Pulse: 70 68  Resp: 20 16  Temp: 36.9 C 37 C  SpO2: 97% 100%    Last Pain:  Vitals:   01/05/22 0739  TempSrc: Oral  PainSc:                  Tiajuana Amass

## 2022-01-05 NOTE — Evaluation (Signed)
Occupational Therapy Evaluation Patient Details Name: Rickey Cochran MRN: [id number] DOB: 11/11/1961 Today's Date: 01/05/2022   History of Present Illness 61 y/o male admitted 2/13 for L2-5 decompression and fusion. 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 He is independent at baseline including working and driving. After review he verbalized great understanding of back precautions and had good carry over during ADLs and mobility. Educated pt on compensatory techniques fro bathing, grooming and LB ADLs, he verbalized great understanding. Overall he was set up for upper body tasks, and supervision - min guard for OOB tasks for safety. Pt does not required further acute OT. Recommend d/c home without follow up OT.      Recommendations for follow up therapy are one component of Rickey multi-disciplinary discharge planning process, led by the attending physician.  Recommendations may be updated based on patient status, additional functional criteria and insurance authorization.   Follow Up Recommendations  No OT follow up    Assistance Recommended at Discharge PRN  Patient can return home with the following Assist for transportation    Functional Status Assessment  Patient has had Rickey recent decline in their functional status and demonstrates the ability to make significant improvements in function in Rickey reasonable and predictable amount of time.  Equipment Recommendations  None recommended by OT       Precautions / Restrictions Precautions Precautions: Back Precaution Booklet Issued: Yes (comment) Required Braces or Orthoses: Spinal Brace Spinal Brace: Lumbar corset Restrictions Weight Bearing Restrictions: No      Mobility Bed Mobility Overal bed mobility: Modified Independent                  Transfers Overall transfer level: Modified independent              Balance Overall balance  assessment: No apparent balance deficits (not formally assessed)           ADL either performed or assessed with clinical judgement   ADL Overall ADL's : Needs assistance/impaired                 General ADL Comments: Pt is set up Rickey for upper body ADLs, and min guard for lower body ADLs. He is unable to obtain the figure four position, but can prop his LE up to don LB clothing without bending. Reviewed compensatory techniques and safety wtih bathing, grooming and self care. No AD.     Vision Baseline Vision/History: 0 No visual deficits Ability to See in Adequate Light: 0 Adequate Vision Assessment?: No apparent visual deficits            Pertinent Vitals/Pain Pain Assessment Pain Assessment: Faces Faces Pain Scale: Hurts little more Pain Location: low back Pain Descriptors / Indicators: Discomfort, Grimacing Pain Intervention(s): Limited activity within patient's tolerance     Hand Dominance Right   Extremity/Trunk Assessment Upper Extremity Assessment Upper Extremity Assessment: Overall WFL for tasks assessed   Lower Extremity Assessment Lower Extremity Assessment: Overall WFL for tasks assessed   Cervical / Trunk Assessment Cervical / Trunk Assessment: Back Surgery   Communication Communication Communication: No difficulties   Cognition Arousal/Alertness: Awake/alert Behavior During Therapy: WFL for tasks assessed/performed Overall Cognitive Status: Within Functional Limits for tasks assessed                   General Comments  VSS on RA, wife present and supportive  Home Living Family/patient expects to be discharged to:: Private residence Living Arrangements: Spouse/significant other Available Help at Discharge: Family;Available 24 hours/day Type of Home: House Home Access: Stairs to enter [employer] of Steps: 3 Entrance Stairs-Rails: None Home Layout: Two level;Able to live on main level with bedroom/bathroom;Laundry or  work area in basement     [employer] Shower/Tub: Teacher, early years/pre: Standard     Home Equipment: Conservation officer, nature (2 wheels);BSC/3in1          Prior Functioning/Environment Prior Level of Function : Independent/Modified Independent             Mobility Comments: Independent, driving. 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          OT Problem List: Decreased range of motion;Decreased activity tolerance;Impaired balance (sitting and/or standing);Decreased safety awareness;Decreased knowledge of precautions;Pain      OT Treatment/Interventions:      OT Goals(Current goals can be found in the care plan section) Acute Rehab OT Goals Patient Stated Goal: home OT Goal Formulation: With patient Time For Goal Achievement: 01/05/22  OT Frequency:         AM-PAC OT "6 Clicks" Daily Activity     Outcome Measure Help from another person eating meals?: None Help from another person taking care of personal grooming?: None Help from another person toileting, which includes using toliet, bedpan, or urinal?: None Help from another person bathing (including washing, rinsing, drying)?: None Help from another person to put on and taking off regular upper body clothing?: None Help from another person to put on and taking off regular lower body clothing?: Rickey Little 6 Click Score: 23   End of Session Equipment Utilized During Treatment: Back brace Nurse Communication: Mobility status  Activity Tolerance: Patient tolerated treatment well Patient left: in chair;with call bell/phone within reach;with family/visitor present  OT Visit Diagnosis: Other abnormalities of gait and mobility (R26.89);Muscle weakness (generalized) (M62.81);Pain                Time: 0810-0825 OT Time Calculation (min): 15 min Charges:  OT General Charges $OT Visit: 1 Visit OT Evaluation $OT Eval Low Complexity: 1 Low   Rickey Cochran Rickey Cochran 01/05/2022, 9:08 AM

## 2022-01-05 NOTE — Progress Notes (Signed)
Orthopedic Tech Progress Note Patient Details:  DAKWAN PRIDGEN 1961-08-11 [id number]  Patient ID: Madison Hickman, male   DOB: Aug 27, 1961, 61 y.o.   MRN: [id number] I spoke with floor staff that said the patient has brace at home and family will be bringing it this morning. Karolee Stamps 01/05/2022, 5:45 AM

## 2022-01-05 NOTE — Evaluation (Signed)
Physical Therapy Evaluation/ Discharge Patient Details Name: Rickey Cochran MRN: [id number] DOB: 09/01/1961 Today's Date: 01/05/2022  History of Present Illness  61 y/o male admitted 2/13 for L2-5 decompression and fusion. 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 Pt educated for brace wear with wife present and pt able to demonstrate bed mobility, basic transfers and gait after initial education. Pt verbalized understanding of all education with handout provided and wife able to assist at D/C. Pt with DME at home from TKA and does not require anything further. No further acute therapy needs, will sign off with pt aware and agreeable.        Recommendations for follow up therapy are one component of a multi-disciplinary discharge planning process, led by the attending physician.  Recommendations may be updated based on patient status, additional functional criteria and insurance authorization.  Follow Up Recommendations No PT follow up    Assistance Recommended at Discharge PRN  Patient can return home with the following  Assistance with cooking/housework;Assist for transportation    Equipment Recommendations None recommended by PT  Recommendations for Other Services       Functional Status Assessment Patient has not had a recent decline in their functional status     Precautions / Restrictions Precautions Precautions: Back Precaution Booklet Issued: Yes (comment)      Mobility  Bed Mobility Overal bed mobility: Modified Independent                  Transfers Overall transfer level: Modified independent                      Ambulation/Gait Ambulation/Gait assistance: Modified independent (Device/Increase time) Gait Distance (Feet): 400 Feet Assistive device: None Gait Pattern/deviations: Step-through pattern, Decreased stride length   Gait velocity interpretation: >2.62 ft/sec,  indicative of community ambulatory   General Gait Details: pt with good posture and speed without LOB with deficit during gait  Stairs Stairs: Yes Stairs assistance: Modified independent (Device/Increase time) Stair Management: Step to pattern, Forwards, No rails Number of Stairs: 4 General stair comments: pt maintaining step to pattern after recent TKA  Wheelchair Mobility    Modified Rankin (Stroke Patients Only)       Balance Overall balance assessment: No apparent balance deficits (not formally assessed)                                           Pertinent Vitals/Pain Pain Assessment Pain Assessment: 0-10 Pain Score: 4  Pain Location: incisional Pain Descriptors / Indicators: Aching Pain Intervention(s): Limited activity within patient's tolerance, Monitored during session, Premedicated before session, Repositioned    Home Living Family/patient expects to be discharged to:: Private residence Living Arrangements: Spouse/significant other Available Help at Discharge: Family;Available 24 hours/day Type of Home: House Home Access: Stairs to enter Entrance Stairs-Rails: None Entrance Stairs-Number of Steps: 3   Home Layout: Two level;Able to live on main level with bedroom/bathroom;Laundry or work area in [place]: Conservation officer, nature (2 wheels);BSC/3in1      Prior Function Prior Level of Function : Independent/Modified Independent             Mobility Comments: Independent, driving. 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       Journalist, newspaper        Extremity/Trunk Assessment  Upper Extremity Assessment Upper Extremity Assessment: Overall WFL for tasks assessed    Lower Extremity Assessment Lower Extremity Assessment: Overall WFL for tasks assessed    Cervical / Trunk Assessment Cervical / Trunk Assessment: Back Surgery  Communication   Communication: No difficulties  Cognition Arousal/Alertness:  Awake/alert Behavior During Therapy: WFL for tasks assessed/performed Overall Cognitive Status: Within Functional Limits for tasks assessed                                          General Comments      Exercises     Assessment/Plan    PT Assessment Patient does not need any further PT services  PT Problem List         PT Treatment Interventions      PT Goals (Current goals can be found in the Care Plan section)  Acute Rehab PT Goals PT Goal Formulation: All assessment and education complete, DC therapy    Frequency       Co-evaluation               AM-PAC PT "6 Clicks" Mobility  Outcome Measure Help needed turning from your back to your side while in a flat bed without using bedrails?: None Help needed moving from lying on your back to sitting on the side of a flat bed without using bedrails?: None Help needed moving to and from a bed to a chair (including a wheelchair)?: None Help needed standing up from a chair using your arms (e.g., wheelchair or bedside chair)?: None Help needed to walk in hospital room?: None Help needed climbing 3-5 steps with a railing? : None 6 Click Score: 24    End of Session Equipment Utilized During Treatment: Back brace Activity Tolerance: Patient tolerated treatment well Patient left: in chair;with call bell/phone within reach;with family/visitor present Nurse Communication: Mobility status PT Visit Diagnosis: Other abnormalities of gait and mobility (R26.89)    Time: 4268-3419 PT Time Calculation (min) (ACUTE ONLY): 23 min   Charges:   PT Evaluation $PT Eval Moderate Complexity: 1 Mod PT Treatments $Gait Training: 8-22 mins        Elba Dendinger P, PT Acute Rehabilitation Services Pager: [phone] Office: [place] [facility] Maximos Zayas 01/05/2022, 8:48 AM

## 2022-01-05 NOTE — Discharge Summary (Signed)
Physician Discharge Summary  Patient ID: Rickey Cochran MRN: [id number] DOB/AGE: [birth year] 61 y.o.  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  Lumbar radiculopathy.  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  Lumbar radiculopathy. Principal Problem:   Lumbar stenosis with neurogenic claudication   Discharged Condition: good  Hospital Course: Patient was admitted to undergo surgical decompression and stabilization L2-L5.  He tolerated surgery well.  Consults: None  Significant Diagnostic Studies: None  Treatments: surgery: See op note  Discharge Exam: Blood pressure 131/76, pulse 68, temperature 98.6 F (37 C), temperature source Oral, resp. rate 16, height 6\' 2"  (1.88 m), weight 99.8 kg, SpO2 100 %. Station and gait is intact incision is clean and dry.  Disposition: Discharge disposition: 01-Home or Self Care       Discharge Instructions     Call MD for:  redness, tenderness, or signs of infection (pain, swelling, redness, odor or green/yellow discharge around incision site)   Complete by: As directed    Call MD for:  severe uncontrolled pain   Complete by: As directed    Call MD for:  temperature >100.4   Complete by: As directed    Diet - low sodium heart healthy   Complete by: As directed    Discharge wound care:   Complete by: As directed    Okay to shower. Do not apply salves or appointments to incision. No heavy lifting with the upper extremities greater than 10 pounds. May resume driving when not requiring pain medication and patient feels comfortable with doing so.   Incentive spirometry RT   Complete by: As directed    Increase activity slowly   Complete by: As directed       Allergies as of 01/05/2022   No Known Allergies      Medication List     TAKE these medications    acetaminophen 500 MG  tablet Commonly known as: TYLENOL Take 1,000 mg by mouth every 6 (six) hours as needed (pain).   alfuzosin 10 MG 24 hr tablet Commonly known as: UROXATRAL TAKE 1 TABLET BY MOUTH ONCE DAILY WITH BREAKFAST   Aspirin Low Dose 81 MG chewable tablet Generic drug: aspirin Chew 1 tablet (81 mg total) by mouth 2 (two) times daily.   celecoxib 100 MG capsule Commonly known as: CELEBREX Take 1 capsule (100 mg total) by mouth 2 (two) times daily.   ibuprofen 800 MG tablet Commonly known as: ADVIL Take 1 tablet (800 mg total) by mouth every 8 (eight) hours as needed. What changed:  medication strength reasons to take this   levothyroxine 88 MCG tablet Commonly known as: SYNTHROID TAKE 1 TABLET BY MOUTH IN THE MORNING ON AN EMPTY STOMACH   levothyroxine 88 MCG tablet Commonly known as: SYNTHROID Take 1 tablet by mouth daily in the morning on an empty stomach   levothyroxine 88 MCG tablet Commonly known as: SYNTHROID TAKE 1 TABLET BY MOUTH IN THE MORNING ON AN EMPTY STOMACH   methocarbamol 500 MG tablet Commonly known as: ROBAXIN Take 1 tablet (500 mg total) by mouth every 6 (six) hours as needed for muscle spasms. What changed: Another medication with the same name was added. Make sure you understand how and when to take each.   methocarbamol 500 MG tablet Commonly known as: ROBAXIN Take 1 tablet (500 mg total) by mouth every 6 (six) hours as needed for muscle spasms. What  changed: You were already taking a medication with the same name, and this prescription was added. Make sure you understand how and when to take each.   metoprolol succinate 25 MG 24 hr tablet Commonly known as: Toprol XL Take 1 tablet (25 mg total) by mouth daily. What changed: when to take this   multivitamin with minerals Tabs tablet Take 1 tablet by mouth in the morning.   ondansetron 4 MG tablet Commonly known as: ZOFRAN Take 1 tablet (4 mg total) by mouth every 6 (six) hours as needed for nausea or  vomiting.   oxyCODONE-acetaminophen 5-325 MG tablet Commonly known as: PERCOCET/ROXICET Take 1-2 tablets by mouth every 4 (four) hours as needed for severe pain.   sertraline 100 MG tablet Commonly known as: ZOLOFT TAKE 1 TABLET BY MOUTH EVERY NIGHT AT BEDTIME   traMADol 50 MG tablet Commonly known as: ULTRAM Take 1 tablet (50 mg total) by mouth every 6 (six) hours. What changed:  when to take this reasons to take this               Discharge Care Instructions  (From admission, onward)           Start     Ordered   01/05/22 0000  Discharge wound care:       Comments: Okay to shower. Do not apply salves or appointments to incision. No heavy lifting with the upper extremities greater than 10 pounds. May resume driving when not requiring pain medication and patient feels comfortable with doing so.   01/05/22 1229             Signed: Blanchie Dessert Toney Lizaola 01/05/2022, 12:29 PM

## 2022-01-06 MED FILL — Sodium Chloride IV Soln 0.9%: INTRAVENOUS | Qty: 1000 | Status: AC

## 2022-01-06 MED FILL — Sodium Chloride Irrigation Soln 0.9%: Qty: 3000 | Status: AC

## 2022-01-06 MED FILL — Heparin Sodium (Porcine) Inj 1000 Unit/ML: INTRAMUSCULAR | Qty: 30 | Status: AC

## 2022-01-08 ENCOUNTER — Other Ambulatory Visit (HOSPITAL_COMMUNITY): Payer: Self-pay

## 2022-01-08 MED ORDER — OXYCODONE-ACETAMINOPHEN 5-325 MG PO TABS
ORAL_TABLET | ORAL | 0 refills | Status: DC
  Filled 2022-01-08: qty 40, 10d supply, fill #0

## 2022-01-27 ENCOUNTER — Other Ambulatory Visit (HOSPITAL_COMMUNITY): Payer: Self-pay

## 2022-01-27 MED ORDER — IBUPROFEN 800 MG PO TABS
800.0000 mg | ORAL_TABLET | Freq: Three times a day (TID) | ORAL | 4 refills | Status: DC
  Filled 2022-01-27: qty 60, 20d supply, fill #0
  Filled 2022-02-26: qty 60, 20d supply, fill #1
  Filled 2022-04-06: qty 60, 20d supply, fill #2
  Filled 2022-05-17: qty 60, 20d supply, fill #3
  Filled 2022-07-07: qty 60, 20d supply, fill #4

## 2022-01-27 MED ORDER — METHOCARBAMOL 500 MG PO TABS
500.0000 mg | ORAL_TABLET | Freq: Four times a day (QID) | ORAL | 0 refills | Status: DC | PRN
  Filled 2022-01-27: qty 60, 15d supply, fill #0

## 2022-02-04 ENCOUNTER — Other Ambulatory Visit: Payer: Self-pay | Admitting: Internal Medicine

## 2022-02-04 ENCOUNTER — Other Ambulatory Visit (HOSPITAL_COMMUNITY): Payer: Self-pay

## 2022-02-04 MED ORDER — ALFUZOSIN HCL ER 10 MG PO TB24
ORAL_TABLET | Freq: Every day | ORAL | 0 refills | Status: DC
  Filled 2022-02-04: qty 90, 90d supply, fill #0

## 2022-02-08 ENCOUNTER — Ambulatory Visit: Payer: No Typology Code available for payment source | Admitting: Family Medicine

## 2022-02-26 ENCOUNTER — Other Ambulatory Visit (HOSPITAL_COMMUNITY): Payer: Self-pay

## 2022-03-17 ENCOUNTER — Ambulatory Visit: Payer: No Typology Code available for payment source | Admitting: Physical Therapy

## 2022-03-17 ENCOUNTER — Encounter: Payer: Self-pay | Admitting: Physical Therapy

## 2022-03-17 DIAGNOSIS — M5459 Other low back pain: Secondary | ICD-10-CM

## 2022-03-17 NOTE — Therapy (Signed)
?OUTPATIENT PHYSICAL THERAPY THORACOLUMBAR EVALUATION ? ? ?Patient Name: Rickey Cochran ?MRN: [id number] ?DOB:1961-05-21, 61 y.o., male ?Today's Date: 03/17/2022 ? ? PT End of Session - 03/17/22 1142   ? ? Visit Number 1   ? Number of Visits 12   ? Date for PT Re-Evaluation 05/12/22   ? PT Start Time 1100   ? PT Stop Time 1145   ? PT Time Calculation (min) 45 min   ? Activity Tolerance Patient tolerated treatment well   ? Behavior During Therapy [facility] for tasks assessed/performed   ? ?  ?  ? ?  ? ? ?Past Medical History:  ?Diagnosis Date  ? Arthritis   ? Cancer [facility])   ? possible thyroid cancer, now s/p partial thyroidectomy   ? Depression   ? Dysrhythmia   ? Hypertension   ? several years ago was on lisiopril but lost weight and no longer needs  ? Hypothyroidism   ? mgd on levothyroxine   ? Left-sided tinnitus   ? Premature atrial contraction   ? was seen on EKG at his PCP approx 8 years ago, denies any symptoms, reports he was a runner before onset of hip pain in feb 2020  ? Sleep apnea   ? ?Past Surgical History:  ?Procedure Laterality Date  ? COLONOSCOPY    ? IR THORACENTESIS ASP PLEURAL SPACE W/IMG GUIDE  03/20/2021  ? JOINT REPLACEMENT Right 04/13/2019  ? right total hip arthroplasty  ? THYROID LOBECTOMY Right 05/09/2018  ? THYROID LOBECTOMY Right 05/09/2018  ? Procedure: RIGHT THYROID LOBECTOMY;  Surgeon: Ralene Ok, MD;  Location: [place];  Service: General;  Laterality: Right;  ? TOTAL HIP ARTHROPLASTY Right 04/13/2019  ? Procedure: RIGHT TOTAL HIP ARTHROPLASTY ANTERIOR APPROACH;  Surgeon: Mcarthur Rossetti, MD;  Location: [facility];  Service: Orthopedics;  Laterality: Right;  ? TOTAL KNEE ARTHROPLASTY Left 11/12/2021  ? Procedure: LEFT TOTAL KNEE ARTHROPLASTY;  Surgeon: Meredith Pel, MD;  Location: [place];  Service: Orthopedics;  Laterality: Left;  ? ?Patient Active Problem List  ? Diagnosis Date Noted  ? Lumbar stenosis with neurogenic claudication 01/04/2022  ? OA (osteoarthritis) of knee  11/12/2021  ? S/P TKR (total knee replacement), left 11/12/2021  ? NSVT (nonsustained ventricular tachycardia) ([place]) 05/20/2021  ? Paroxysmal atrial fibrillation ([place]) 05/20/2021  ? Pleurodynia 03/23/2021  ? Multiple fractures of ribs, right side, subsequent encounter for fracture with delayed healing 03/18/2021  ? Non-toxic multinodular goiter 02/25/2021  ? Nontoxic single thyroid nodule 02/25/2021  ? Personal history of malignant neoplasm of thyroid 02/25/2021  ? Hypothyroidism 02/25/2021  ? Thyroid cancer ([place]) 02/25/2021  ? Screen for colon cancer 02/20/2021  ? Chronic sinus bradycardia 02/17/2021  ? Primary osteoarthritis of both knees 02/17/2021  ? Primary osteoarthritis of both feet 02/17/2021  ? Primary osteoarthritis of left knee 09/23/2020  ? Routine general medical examination at health care facility 01/29/2020  ? Premature atrial contraction   ? BPH associated with nocturia 05/23/2019  ? Depression with anxiety 05/23/2019  ? Postoperative hypothyroidism 05/23/2019  ? Pes planus of both feet 09/11/2015  ? ? ?PCP: Janith Lima, MD ? ?REFERRING PROVIDER: Kristeen Miss, MD ? ?REFERRING DIAG: N27.782 (ICD-10-CM) - Spinal stenosis, lumbar region with neurogenic claudication ? ?THERAPY DIAG:  ?Other low back pain ? ?ONSET DATE: S/P lumbar fusion L2-5 01/04/22 ? ?SUBJECTIVE:                                                                                                                                                                                          ? ?  SUBJECTIVE STATEMENT: ?He had back surgery, been doing ok overall but having difficulty bending over. Pain is mostly Rt sided and feels muscular and not the pinched nerve pain he was having before surgery, denies N/T or bowel/bladder difficulty. Pain is worse in the morning with stiffness and tightness. He is interested in DN ?PERTINENT HISTORY:  ?S/P lumbar fusion L2-5 01/04/22 ? ?PAIN:  ?Are you having pain? Yes: NPRS scale: 5/10 ?Pain location: Rt  lumbar  ?Pain description: locked up, feels knots and tightness ?Aggravating factors: bending over, sleeping, lifting ?Relieving factors: walking, stretching ? ? ?PRECAUTIONS: Back ? ?WEIGHT BEARING RESTRICTIONS No ? ?FALLS:  ?Has patient fallen in last 6 months? No ? 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 ?PLOF: Independent ? ?PATIENT GOALS : be able to bend over and pick up plants for work, be able to ride road bike and hike moderate trails ? ? ?OBJECTIVE:  ? ?DIAGNOSTIC FINDINGS:  ? ? ?PATIENT SURVEYS:  ?03/17/22 FOTO 51% functional intake ? ?SCREENING FOR RED FLAGS: ?Bowel or bladder incontinence: No ?Cauda equina syndrome: No ? ?COGNITION: ? Overall cognitive status: Within functional limits for tasks assessed   ?  ?SENSATION: ?Intact light touch ? ?MUSCLE LENGTH: ?Hamstrings: Right 75 deg; Left 75 deg ? ?PALPATION: ?Mild tenderness to palpation with trigger points along Rt lumbar P.S, QL ? ?LUMBAR ROM:  ? ?Active  A/PROM  ?03/17/2022  ?Flexion 50  ?Extension 10  ?Right lateral flexion 10  ?Left lateral flexion 10  ?Right rotation 75%  ?Left rotation 75%  ? (Blank rows = not tested) ? ?LE ROM: WFL 03/17/22 ? ? ? ?LE MMT: [facility] 03/17/22 ? ?LUMBAR SPECIAL TESTS:  ?Straight leg raise test: Negative and Slump test: Negative ? ?FUNCTIONAL TESTS:  ? ? ?GAIT: ?Distance walked: mild limitation in velocity and prolonged tolerance ? ? ?TODAY'S TREATMENT  ?03/17/22 ?Manual therapy for skilled palpation and active compression with DN to Rt lumbar P.S, and QL. Verbal consent given and education provided. ?Reviewed HEP listed below ?Bike L3 X 10 min ? ? ?PATIENT EDUCATION:  ?Education details: HEP,PT POC, DN ?Person educated: Patient ?Education method: Explanation and Demonstration ?Education comprehension: verbalized understanding and needs further education ? ? ?HOME EXERCISE PROGRAM: ?Access Code: [email or web address] ?URL: [email or web address] ?Date: 03/17/2022 ?Prepared by: Elsie Ra ? ?Exercises ?- Supine Lower Trunk Rotation  - 2 x daily - 6 x weekly - 1 sets - 5 reps - 10 sec hold ?- Hooklying Single Knee to Chest Stretch  - 2 x daily - 6 x weekly - 1 sets - 2 reps - 20 hold ?- Supine Bridge  - 2 x daily - 6 x weekly - 1-2 sets - 10 reps - 5 hold ?- Standing 'L' Stretch at Counter  - 2 x daily - 6 x weekly - 1 sets - 10 reps - 10 hold ?- TL Sidebending Stretch - Single Arm Overhead  - 2 x daily - 6 x weekly - 1 sets - 10 reps - 10 hold ? ? ?ASSESSMENT: ? ?CLINICAL IMPRESSION: Patient presents to PT S/P lumbar fusion L2-5 01/04/22. He is overall doing quite well with strength and ambulation but is limited by pain, tightness, and stiffness particularly in his Rt lumbar area. Patient will benefit from skilled PT to address below impairments, limitations and improve overall function. ? ?OBJECTIVE IMPAIRMENTS: decreased activity tolerance, difficulty walking, decreased balance, decreased endurance, decreased mobility, decreased ROM, decreased strength, impaired flexibility, postural dysfunction, and pain. ? ?ACTIVITY LIMITATIONS:  bending, lifting, carry, locomotion, cleaning, community activity, driving, and or occupation ? ?PERSONAL FACTORS: HTN, Rt THA, Lt TKA are also affecting patient's functional outcome. ? ?REHAB POTENTIAL: Good ? ?CLINICAL DECISION MAKING: Stable/uncomplicated ? ?EVALUATION COMPLEXITY: Low ? ? ? ?GOALS: ?Short term PT Goals Target date: 04/14/2022 ?Pt will be I and compliant with HEP. ?Baseline:  ?Goal status: New ?Pt will decrease pain and tightness by 25% overall ?Baseline: ?Goal status: New ? ?Long term PT goals Target date: 05/12/2022 ?Pt will improve lumbar ROM to [facility] to improve functional mobility ?Baseline: ?Goal status: New ?Pt will improve FOTO to at least 59% functional to show improved function ?Baseline: ?Goal status: New ?Pt will reduce pain and tightness by overall 50% overall with usual activity ?Baseline: ?Goal status: New ?Pt will be able to return to  riding road bike, moderate hiking, golf with no more than minimal complaints. ?Baseline: ?Goal status: New ? ?PLAN: ?PT FREQUENCY: 1-2 times per week  ? ?PT DURATION: 6-8 weeks ? ?PLANNED INTERVENTIONS (unless contrai

## 2022-03-22 NOTE — Patient Instructions (Addendum)
Please continue using your CPAP regularly. While your insurance requires that you use CPAP at least 4 hours each night on 70% of the nights, I recommend, that you not skip any nights and use it throughout the night if you can. Getting used to CPAP and staying with the treatment long term does take time and patience and discipline. Untreated obstructive sleep apnea when it is moderate to severe can have an adverse impact on cardiovascular health and raise her risk for heart disease, arrhythmias, hypertension, congestive heart failure, stroke and diabetes. Untreated obstructive sleep apnea causes sleep disruption, nonrestorative sleep, and sleep deprivation. This can have an impact on your day to day functioning and cause daytime sleepiness and impairment of cognitive function, memory loss, mood disturbance, and problems focussing. Using CPAP regularly can improve these symptoms. ? ?Please use CPAP for at least 4 hours for the next 30 days. Call me when you reach this goal and I can repeat download.  ? ?We will have you see Dr Rexene Alberts for the next follow up to discuss [facility] in the event you are unable to tolerate CPAP.  ? ?

## 2022-03-22 NOTE — Progress Notes (Signed)
? ? ?PATIENT: Rickey Cochran ?DOB: 18-Feb-1961 ? ?REASON FOR VISIT: follow up ?HISTORY FROM: patient ? ?Chief Complaint  ?Patient presents with  ? Follow-up  ?  Pt alone, rm 16. Pt states he has a hard time tolerating the machine. He feels like suffocating and has claustrophobia concerns anyway. DME [employer]. He has tried 3-4 diff mask and all leave him feeling as if a bag is being put over his face. He is interested in alt options. Did not use the machine month or April. Last used a little bit in march  ?  ? ?HISTORY OF PRESENT ILLNESS: ? ?03/23/22 ALL:  ?Rickey Cochran returns for follow up for OSA on CPAP. He was last seen 09/2021 and having difficulty getting adjusted to therapy. We ordered desensitizing/reeducation through DME. Since, he reports that he never heard back from [facility]. He did not reach out to me or DME to inquire. He reports trying multiple mask but could never find one that was tolerable. He does report working up to about 5 hours of usage around the first of the year but had back surgery in February and has not used consistently since. He had left knee replacement in 10/2021. He admits that he has not been as dedicated to trying to use CPAP. He is not sure he is going to be able to tolerate.  ? ? ? ?10/13/2021 ALL:  ?Rickey Cochran is a 61 y.o. male here today for follow up for OSA on CPAP. He has a history of afib and was seen in consult with Dr Rickey Cochran for snoring and EDS. HST 05/2021 showed severe OSA with AHI 33.2/hr and O2 nadir of 84%. He has had a difficult time with CPAP therapy. He has used it twice in the past 45 days. He states that air pressure makes the mask move and he could not get comfortable. He felt that he could not breathe. He is disappointed with the set up education. He reports having a group session with multiple other patients and was not sure he understood how to use his machine. He does recognize health benefits of treating apnea and would like to continue working on  tolerability.  ? ? ?HISTORY: (copied from Dr Guadelupe Sabin previous note) ? ?Dear Dr. Ronnald Cochran,  ?   ?I saw your patient, Rickey Cochran, upon your kind request in my sleep clinic today for initial consultation of his sleep disorder, in particular, concern for underlying obstructive sleep apnea.  The patient is unaccompanied today.  As you know, [name] is a 61 year old right-handed gentleman with an underlying medical history of sinus bradycardia, PACs, osteoarthritis, thyroid cancer, hypothyroidism, hypertension, depression, anxiety, BPH, recent rib fractures in April 0350 with complication of hemothorax, overweight state, and recent diagnosis of paroxysmal A. fib, who reports snoring and excessive daytime somnolence, as well as witnessed apneas per wife's feedback.  I reviewed your office note from 02/17/2021.  His Epworth sleepiness score is 11 out of 24, fatigue severity score is 42 out of 63.  His wife is a Rickey Coch[employer] and has encouraged him to get checked for sleep apnea.  His father has sleep apnea.  His father also has a pacemaker.  His mom has A. fib.  Patient reports not being able to stay awake for long in the evening, falls asleep quickly.  He goes to bed generally between 9 and 10 PM and rise time is generally around 6 AM.  He has nocturia about once or twice per average night but denies  recurrent morning headaches.  He lives with his wife, he has a grown daughter and his stepson recently moved out.  They have 2 dogs in the household.  He typically does not watch TV in the bedroom.  He works in [employer], clinical (histocompatibility and immunogenetics).  He has reduced his caffeine intake and limit himself to 2 cups of coffee in the mornings.  He does not drink alcohol and quit smoking in 2007. ?  ? ?REVIEW OF SYSTEMS: Out of a complete 14 system review of symptoms, the patient complains only of the following symptoms, back pain, fatigue  and all other reviewed systems are negative. ? ? ?ALLERGIES: ?No Known Allergies ? ?HOME MEDICATIONS: ?Outpatient  Medications Prior to Visit  ?Medication Sig Dispense Refill  ? acetaminophen (TYLENOL) 500 MG tablet Take 1,000 mg by mouth every 6 (six) hours as needed (pain).    ? alfuzosin (UROXATRAL) 10 MG 24 hr tablet TAKE 1 TABLET BY MOUTH ONCE DAILY WITH BREAKFAST 90 tablet 0  ? ibuprofen (ADVIL) 800 MG tablet Take 1 tablet (800 mg total) by mouth 3 (three) times daily with food as needed 60 tablet 4  ? levothyroxine (SYNTHROID) 88 MCG tablet TAKE 1 TABLET BY MOUTH IN THE MORNING ON AN EMPTY STOMACH 90 tablet 1  ? metoprolol succinate (TOPROL XL) 25 MG 24 hr tablet Take 1 tablet (25 mg total) by mouth daily. (Patient taking differently: Take 25 mg by mouth at bedtime.) 90 tablet 3  ? Multiple Vitamin (MULTIVITAMIN WITH MINERALS) TABS tablet Take 1 tablet by mouth in the morning.    ? sertraline (ZOLOFT) 100 MG tablet TAKE 1 TABLET BY MOUTH EVERY NIGHT AT BEDTIME 90 tablet 1  ? ondansetron (ZOFRAN) 4 MG tablet Take 1 tablet by mouth every 6 (six) hours as needed for nausea or vomiting. 30 tablet 0  ? aspirin 81 MG chewable tablet Chew 1 tablet (81 mg total) by mouth 2 (two) times daily. (Patient not taking: Reported on 12/29/2021) 42 tablet 0  ? celecoxib (CELEBREX) 100 MG capsule Take 1 capsule (100 mg total) by mouth 2 (two) times daily. (Patient not taking: Reported on 12/29/2021) 42 capsule 0  ? ibuprofen (ADVIL) 800 MG tablet Take 1 tablet by mouth every 8 (eight) hours as needed. 30 tablet 3  ? levothyroxine (SYNTHROID) 88 MCG tablet TAKE 1 TABLET BY MOUTH IN THE MORNING ON AN EMPTY STOMACH (Patient not taking: Reported on 12/29/2021) 90 tablet 1  ? levothyroxine (SYNTHROID) 88 MCG tablet Take 1 tablet by mouth daily in the morning on an empty stomach (Patient not taking: Reported on 12/29/2021) 90 tablet 1  ? methocarbamol (ROBAXIN) 500 MG tablet Take 1 tablet (500 mg total) by mouth every 6 (six) hours as needed for muscle spasms. (Patient not taking: Reported on 12/29/2021) 30 tablet 0  ? methocarbamol (ROBAXIN) 500 MG tablet  Take 1 tablet by mouth every 6 (six) hours as needed for muscle spasms. 30 tablet 2  ? methocarbamol (ROBAXIN) 500 MG tablet Take 1 tablet (500 mg total) by mouth up to 4 (four) times daily as needed. 60 tablet 0  ? oxyCODONE-acetaminophen (PERCOCET/ROXICET) 5-325 MG tablet Take 1-2 tablets by mouth every 4 (four) hours as needed for severe pain. 40 tablet 0  ? oxyCODONE-acetaminophen (PERCOCET/ROXICET) 5-325 MG tablet Take 1 tablet by mouth every 6 hours as needed 40 tablet 0  ? traMADol (ULTRAM) 50 MG tablet Take 1 tablet (50 mg total) by mouth every 6 (six) hours. (Patient taking differently: Take 50 mg  by mouth every 6 (six) hours as needed (pain.).) 30 tablet 0  ? ?No facility-administered medications prior to visit.  ? ? ?PAST MEDICAL HISTORY: ?Past Medical History:  ?Diagnosis Date  ? Arthritis   ? Cancer [facility])   ? possible thyroid cancer, now s/p partial thyroidectomy   ? Depression   ? Dysrhythmia   ? Hypertension   ? several years ago was on lisiopril but lost weight and no longer needs  ? Hypothyroidism   ? mgd on levothyroxine   ? Left-sided tinnitus   ? Premature atrial contraction   ? was seen on EKG at his PCP approx 8 years ago, denies any symptoms, reports he was a runner before onset of hip pain in feb 2020  ? Sleep apnea   ? ? ?PAST SURGICAL HISTORY: ?Past Surgical History:  ?Procedure Laterality Date  ? COLONOSCOPY    ? IR THORACENTESIS ASP PLEURAL SPACE W/IMG GUIDE  03/20/2021  ? JOINT REPLACEMENT Right 04/13/2019  ? right total hip arthroplasty  ? THYROID LOBECTOMY Right 05/09/2018  ? THYROID LOBECTOMY Right 05/09/2018  ? Procedure: RIGHT THYROID LOBECTOMY;  Surgeon: Ralene Ok, MD;  Location: [place];  Service: General;  Laterality: Right;  ? TOTAL HIP ARTHROPLASTY Right 04/13/2019  ? Procedure: RIGHT TOTAL HIP ARTHROPLASTY ANTERIOR APPROACH;  Surgeon: Mcarthur Rossetti, MD;  [facility];  Service: Orthopedics;  Laterality: Right;  ? TOTAL KNEE ARTHROPLASTY Left 11/12/2021  ?  Procedure: LEFT TOTAL KNEE ARTHROPLASTY;  Surgeon: Meredith Pel, MD;  Location: [place];  Service: Orthopedics;  Laterality: Left;  ? ? ?FAMILY HISTORY: ?History reviewed. No pertinent family history. ? ?S

## 2022-03-23 ENCOUNTER — Encounter: Payer: Self-pay | Admitting: Family Medicine

## 2022-03-23 ENCOUNTER — Ambulatory Visit: Payer: No Typology Code available for payment source | Admitting: Family Medicine

## 2022-03-23 VITALS — BP 115/79 | HR 68 | Ht 74.0 in | Wt 217.0 lb

## 2022-03-23 DIAGNOSIS — G4733 Obstructive sleep apnea (adult) (pediatric): Secondary | ICD-10-CM | POA: Diagnosis not present

## 2022-03-23 DIAGNOSIS — Z9989 Dependence on other enabling machines and devices: Secondary | ICD-10-CM | POA: Diagnosis not present

## 2022-03-23 NOTE — Progress Notes (Signed)
CM sent to [facility] for new order ?

## 2022-03-24 ENCOUNTER — Ambulatory Visit: Payer: No Typology Code available for payment source | Admitting: Physical Therapy

## 2022-03-24 ENCOUNTER — Encounter: Payer: Self-pay | Admitting: Physical Therapy

## 2022-03-24 DIAGNOSIS — M5459 Other low back pain: Secondary | ICD-10-CM | POA: Diagnosis not present

## 2022-03-24 NOTE — Therapy (Signed)
?OUTPATIENT PHYSICAL THERAPY TREATMENT NOTE ? ? ?Patient Name: Rickey Cochran ?MRN: [id number] ?DOB:[birth year], 61 y.o., male ?Today's Date: 03/24/2022 ? ?PCP: Janith Lima, MD ?REFERRING PROVIDER: Kristeen Miss, MD ? ?END OF SESSION:  ? PT End of Session - 03/24/22 0931   ? ? Visit Number 2   ? Number of Visits 12   ? Date for PT Re-Evaluation 05/12/22   ? PT Start Time 0930   ? PT Stop Time 1009   ? PT Time Calculation (min) 39 min   ? Activity Tolerance Patient tolerated treatment well   ? Behavior During Therapy [facility] for tasks assessed/performed   ? ?  ?  ? ?  ? ? ?Past Medical History:  ?Diagnosis Date  ? Arthritis   ? Cancer [facility])   ? possible thyroid cancer, now s/p partial thyroidectomy   ? Depression   ? Dysrhythmia   ? Hypertension   ? several years ago was on lisiopril but lost weight and no longer needs  ? Hypothyroidism   ? mgd on levothyroxine   ? Left-sided tinnitus   ? Premature atrial contraction   ? was seen on EKG at his PCP approx 8 years ago, denies any symptoms, reports he was a runner before onset of hip pain in feb 2020  ? Sleep apnea   ? ?Past Surgical History:  ?Procedure Laterality Date  ? COLONOSCOPY    ? IR THORACENTESIS ASP PLEURAL SPACE W/IMG GUIDE  03/20/2021  ? JOINT REPLACEMENT Right 04/13/2019  ? right total hip arthroplasty  ? THYROID LOBECTOMY Right 05/09/2018  ? THYROID LOBECTOMY Right 05/09/2018  ? Procedure: RIGHT THYROID LOBECTOMY;  Surgeon: Ralene Ok, MD;  Location: [place];  Service: General;  Laterality: Right;  ? TOTAL HIP ARTHROPLASTY Right 04/13/2019  ? Procedure: RIGHT TOTAL HIP ARTHROPLASTY ANTERIOR APPROACH;  Surgeon: Mcarthur Rossetti, MD;  Location[facility];  Service: Orthopedics;  Laterality: Right;  ? TOTAL KNEE ARTHROPLASTY Left 11/12/2021  ? Procedure: LEFT TOTAL KNEE ARTHROPLASTY;  Surgeon: Meredith Pel, MD;  Location: [place];  Service: Orthopedics;  Laterality: Left;  ? ?Patient Active Problem List  ? Diagnosis Date Noted  ? Lumbar stenosis  with neurogenic claudication 01/04/2022  ? OA (osteoarthritis) of knee 11/12/2021  ? S/P TKR (total knee replacement), left 11/12/2021  ? NSVT (nonsustained ventricular tachycardia) ([place]) 05/20/2021  ? Paroxysmal atrial fibrillation ([place]) 05/20/2021  ? Pleurodynia 03/23/2021  ? Multiple fractures of ribs, right side, subsequent encounter for fracture with delayed healing 03/18/2021  ? Non-toxic multinodular goiter 02/25/2021  ? Nontoxic single thyroid nodule 02/25/2021  ? Personal history of malignant neoplasm of thyroid 02/25/2021  ? Hypothyroidism 02/25/2021  ? Thyroid cancer ([place]) 02/25/2021  ? Screen for colon cancer 02/20/2021  ? Chronic sinus bradycardia 02/17/2021  ? Primary osteoarthritis of both knees 02/17/2021  ? Primary osteoarthritis of both feet 02/17/2021  ? Primary osteoarthritis of left knee 09/23/2020  ? Routine general medical examination at health care facility 01/29/2020  ? Premature atrial contraction   ? BPH associated with nocturia 05/23/2019  ? Depression with anxiety 05/23/2019  ? Postoperative hypothyroidism 05/23/2019  ? Pes planus of both feet 09/11/2015  ? ? ?REFERRING DIAG: M48.062 (ICD-10-CM) - Spinal stenosis, lumbar region with neurogenic claudication ? ?THERAPY DIAG:  ?Other low back pain ? ?PERTINENT HISTORY: S/P lumbar fusion L2-5 01/04/22 ? ?PRECAUTIONS: Back ? ?SUBJECTIVE: felt better after DN initially, but pain and knot returned the next day.  Walking helps so he walked 5  miles this morning. ? ?PAIN:  ?Are you having pain? Yes: NPRS scale: 0, up to 5-6/10 ?Pain location: Rt low back ?Pain description: tightness, aching ?Aggravating factors: worse in AM ?Relieving factors: walking ? ? ?OBJECTIVE: (objective measures completed at initial evaluation unless otherwise dated) ? ? ?PATIENT SURVEYS:  ?03/17/22 FOTO 51% functional intake ?  ?MUSCLE LENGTH: ?Hamstrings: Right 75 deg; Left 75 deg ?  ?PALPATION: ?Mild tenderness to palpation with trigger points along Rt lumbar P.S, QL ?   ?LUMBAR ROM:  ?  ?Active  A/PROM  ?03/17/2022  ?Flexion 50  ?Extension 10  ?Right lateral flexion 10  ?Left lateral flexion 10  ?Right rotation 75%  ?Left rotation 75%  ? (Blank rows = not tested) ?  ?LE MMT: [facility] 03/17/22 ?  ? ?GAIT: ?Distance walked: mild limitation in velocity and prolonged tolerance ?  ?  ?TODAY'S TREATMENT  ?03/24/22 ?Therex: ?     Aerobic: ?Bike L3 x 10 min ?    Supine: ?SKTC 2x30 sec bil ?Lower trunk rotation 2x30 sec bil ?    Standing: ?"L" at counter 5 x 10 sec hold ?Use of tennis ball for STM and trigger point release ? ?Manual: ?Skilled palpation and monitoring of soft tissue during DN: STM with compressionto Rt thoracic paraspinals; palpation to Rt glute med - feel pain there is not trigger point but rather cyst or lipoma - recommended he see MD to assess area ?Trigger Point Dry-Needling  ?Treatment instructions: Expect mild to moderate muscle soreness. S/S of pneumothorax if dry needled over a lung field, and to seek immediate medical attention should they occur. Patient verbalized understanding of these instructions and education. ? ?Patient Consent Given: Yes ?Education handout provided: Yes ?Muscles treated: Rt thoracic paraspinals ?Electrical stimulation performed: No ?Parameters: N/A ?Treatment response/outcome: twitch response noted ? ? ?03/17/22 ?Manual therapy for skilled palpation and active compression with DN to Rt lumbar P.S, and QL. Verbal consent given and education provided. ?Reviewed HEP listed below ?Bike L3 X 10 min ?  ?  ?PATIENT EDUCATION:  ?Education details: HEP,PT POC, DN ?Person educated: Patient ?Education method: Explanation and Demonstration ?Education comprehension: verbalized understanding and needs further education ?  ?  ?HOME EXERCISE PROGRAM: ?Access Code: [email or web address] ?URL: [email or web address] ?Date: 03/17/2022 ?Prepared by: Elsie Ra ?  ?Exercises ?- Supine Lower Trunk Rotation  - 2 x daily - 6 x weekly - 1 sets - 5 reps - 10 sec hold ?-  Hooklying Single Knee to Chest Stretch  - 2 x daily - 6 x weekly - 1 sets - 2 reps - 20 hold ?- Supine Bridge  - 2 x daily - 6 x weekly - 1-2 sets - 10 reps - 5 hold ?- Standing 'L' Stretch at Counter  - 2 x daily - 6 x weekly - 1 sets - 10 reps - 10 hold ?- TL Sidebending Stretch - Single Arm Overhead  - 2 x daily - 6 x weekly - 1 sets - 10 reps - 10 hold ?  ?  ?ASSESSMENT: ?  ?CLINICAL IMPRESSION:  ?Pt tolerated session well today and demonstrates excellent understanding of initial HEP.  Pt will continue to benefit from PT to maximize function.  Area of pain around Rt low back/glute med area does not appear to be a trigger point.  He will follow up with MD to assess and let us know. ?  ?OBJECTIVE IMPAIRMENTS: decreased activity tolerance, difficulty walking, decreased balance, decreased endurance, decreased mobility, decreased ROM, decreased strength, impaired flexibility, postural  dysfunction, and pain. ?  ?ACTIVITY LIMITATIONS: bending, lifting, carry, locomotion, cleaning, community activity, driving, and or occupation ?  ?PERSONAL FACTORS: HTN, Rt THA, Lt TKA are also affecting patient's functional outcome. ?  ?REHAB POTENTIAL: Good ?  ?CLINICAL DECISION MAKING: Stable/uncomplicated ?  ?EVALUATION COMPLEXITY: Low ?  ?  ?  ?GOALS: ?Short term PT Goals Target date: 04/14/2022 ?Pt will be I and compliant with HEP. ?Baseline:  ?Goal status: New ?Pt will decrease pain and tightness by 25% overall ?Baseline: ?Goal status: New ?  ?Long term PT goals Target date: 05/12/2022 ?Pt will improve lumbar ROM to [facility] to improve functional mobility ?Baseline: ?Goal status: New ?Pt will improve FOTO to at least 59% functional to show improved function ?Baseline: ?Goal status: New ?Pt will reduce pain and tightness by overall 50% overall with usual activity ?Baseline: ?Goal status: New ?Pt will be able to return to riding road bike, moderate hiking, golf with no more than minimal complaints. ?Baseline: ?Goal status: New ?   ?PLAN: ?PT FREQUENCY: 1-2 times per week  ?  ?PT DURATION: 6-8 weeks ?  ?PLANNED INTERVENTIONS (unless contraindicated): aquatic PT, Canalith repositioning, cryotherapy, Electrical stimulation, Iontophoresis with 4 mg

## 2022-03-31 ENCOUNTER — Encounter: Payer: Self-pay | Admitting: Rehabilitative and Restorative Service Providers"

## 2022-03-31 ENCOUNTER — Ambulatory Visit: Payer: No Typology Code available for payment source | Admitting: Rehabilitative and Restorative Service Providers"

## 2022-03-31 DIAGNOSIS — M6281 Muscle weakness (generalized): Secondary | ICD-10-CM

## 2022-03-31 DIAGNOSIS — R262 Difficulty in walking, not elsewhere classified: Secondary | ICD-10-CM | POA: Diagnosis not present

## 2022-03-31 DIAGNOSIS — M5459 Other low back pain: Secondary | ICD-10-CM | POA: Diagnosis not present

## 2022-03-31 DIAGNOSIS — M25562 Pain in left knee: Secondary | ICD-10-CM

## 2022-03-31 DIAGNOSIS — R600 Localized edema: Secondary | ICD-10-CM

## 2022-03-31 DIAGNOSIS — G8929 Other chronic pain: Secondary | ICD-10-CM

## 2022-03-31 NOTE — Therapy (Signed)
?OUTPATIENT PHYSICAL THERAPY TREATMENT NOTE ? ? ?Patient Name: Rickey Cochran ?MRN: [id number] ?DOB:February 10, 1961, 61 y.o., male ?Today's Date: 03/31/2022 ? ?PCP: Janith Lima, MD ?REFERRING PROVIDER: Kristeen Miss, MD ? ?END OF SESSION:  ? PT End of Session - 03/31/22 0947   ? ? Visit Number 3   ? Number of Visits 12   ? Date for PT Re-Evaluation 05/12/22   ? PT Start Time [phone]   ? PT Stop Time 1006   ? PT Time Calculation (min) 39 min   ? Activity Tolerance Patient tolerated treatment well   ? Behavior During Therapy [facility] for tasks assessed/performed   ? ?  ?  ? ?  ? ? ? ?Past Medical History:  ?Diagnosis Date  ? Arthritis   ? Cancer [facility])   ? possible thyroid cancer, now s/p partial thyroidectomy   ? Depression   ? Dysrhythmia   ? Hypertension   ? several years ago was on lisiopril but lost weight and no longer needs  ? Hypothyroidism   ? mgd on levothyroxine   ? Left-sided tinnitus   ? Premature atrial contraction   ? was seen on EKG at his PCP approx 8 years ago, denies any symptoms, reports he was a runner before onset of hip pain in feb 2020  ? Sleep apnea   ? ?Past Surgical History:  ?Procedure Laterality Date  ? COLONOSCOPY    ? IR THORACENTESIS ASP PLEURAL SPACE W/IMG GUIDE  03/20/2021  ? JOINT REPLACEMENT Right 04/13/2019  ? right total hip arthroplasty  ? THYROID LOBECTOMY Right 05/09/2018  ? THYROID LOBECTOMY Right 05/09/2018  ? Procedure: RIGHT THYROID LOBECTOMY;  Surgeon: Ralene Ok, MD;  Location: [place];  Service: General;  Laterality: Right;  ? TOTAL HIP ARTHROPLASTY Right 04/13/2019  ? Procedure: RIGHT TOTAL HIP ARTHROPLASTY ANTERIOR APPROACH;  Surgeon: Mcarthur Rossetti, MD;  [facility];  Service: Orthopedics;  Laterality: Right;  ? TOTAL KNEE ARTHROPLASTY Left 11/12/2021  ? Procedure: LEFT TOTAL KNEE ARTHROPLASTY;  Surgeon: Meredith Pel, MD;  Location: [place];  Service: Orthopedics;  Laterality: Left;  ? ?Patient Active Problem List  ? Diagnosis Date Noted  ? Lumbar stenosis  with neurogenic claudication 01/04/2022  ? OA (osteoarthritis) of knee 11/12/2021  ? S/P TKR (total knee replacement), left 11/12/2021  ? NSVT (nonsustained ventricular tachycardia) ([place]) 05/20/2021  ? Paroxysmal atrial fibrillation ([place]) 05/20/2021  ? Pleurodynia 03/23/2021  ? Multiple fractures of ribs, right side, subsequent encounter for fracture with delayed healing 03/18/2021  ? Non-toxic multinodular goiter 02/25/2021  ? Nontoxic single thyroid nodule 02/25/2021  ? Personal history of malignant neoplasm of thyroid 02/25/2021  ? Hypothyroidism 02/25/2021  ? Thyroid cancer ([place]) 02/25/2021  ? Screen for colon cancer 02/20/2021  ? Chronic sinus bradycardia 02/17/2021  ? Primary osteoarthritis of both knees 02/17/2021  ? Primary osteoarthritis of both feet 02/17/2021  ? Primary osteoarthritis of left knee 09/23/2020  ? Routine general medical examination at health care facility 01/29/2020  ? Premature atrial contraction   ? BPH associated with nocturia 05/23/2019  ? Depression with anxiety 05/23/2019  ? Postoperative hypothyroidism 05/23/2019  ? Pes planus of both feet 09/11/2015  ? ? ?REFERRING DIAG: M48.062 (ICD-10-CM) - Spinal stenosis, lumbar region with neurogenic claudication ? ?THERAPY DIAG:  ?Other low back pain ? ?Chronic pain of left knee ? ?Muscle weakness (generalized) ? ?Difficulty in walking, not elsewhere classified ? ?Localized edema ? ?PERTINENT HISTORY: S/P lumbar fusion L2-5 01/04/22 ? ?PRECAUTIONS: Back ? ?  SUBJECTIVE: Pt indicated feeling improvement in thoracic back  ? ?PAIN:  ?NPRS scale: 5/10 this morning  ?Pain location: Rt low back, ?Pain description: tightness, aching ?Aggravating factors: worse in AM ?Relieving factors: walking ? ? ?OBJECTIVE: (objective measures completed at initial evaluation unless otherwise dated) ? ? ?PATIENT SURVEYS:  ?03/17/22 FOTO 51% functional intake ?  ?MUSCLE LENGTH: ?03/31/2022:  (+) thomas on Rt mildly for hip flexor tightness ? ?03/17/2022 Hamstrings: Right  75 deg; Left 75 deg ? ?Posture: ? 03/31/2022:  Very mild Lt lateral shift of trunk on hips ?  ?PALPATION: ?03/17/2022 Mild tenderness to palpation with trigger points along Rt lumbar P.S, QL ?  ?LUMBAR ROM:  ?  ?Active  A/PROM  ?03/17/2022 AROM ?03/31/2022  ?Flexion 50 Movement to top of foot with low back pain noted painful arc  ?Extension 10 50% WFL c no complaint change  ?Right lateral flexion 10   ?Left lateral flexion 10   ?Right rotation 75%   ?Left rotation 75%   ? (Blank rows = not tested) ?  ?LE MMT: WFL 03/17/22 ?  ? ?GAIT: ?03/17/2022 Distance walked: mild limitation in velocity and prolonged tolerance ?  ?  ?TODAY'S TREATMENT  ?03/31/2022: ?Therex: ? Prone opposite arm/leg lift 3 sec hold x 10 bilateral ? Supine SKC to chest 20 sec hold x 2 bilateral ? Supine lumbar rotation stretch 15 sec x 3 bilateral ? Supine Rt thomas test stretch 15 sec x 3 ? Standing lateral shift correction at wall (Lt side at wall) x 10 ? Standing hip hike hold 3 sec x 10 bilateral ? Recumbent bike lvl 3 7 mins ? ? ?Manual: ? Compression to Rt lower lumbar paraspinals , Rt inferior QL.  Palpation during DN ? ?Dry Needing ? Twitch response noted from Rt QL c concordant symptoms.  No concordant symptoms noted from Rt lumbar paraspinals (minimal twitching)  ? ?03/24/22 ?Therex: ?     Aerobic: ?Bike L3 x 10 min ?    Supine: ?SKTC 2x30 sec bil ?Lower trunk rotation 2x30 sec bil ?    Standing: ?"L" at counter 5 x 10 sec hold ?Use of tennis ball for STM and trigger point release ? ?Manual: ?Skilled palpation and monitoring of soft tissue during DN: STM with compressionto Rt thoracic paraspinals; palpation to Rt glute med - feel pain there is not trigger point but rather cyst or lipoma - recommended he see MD to assess area ?Trigger Point Dry-Needling  ?Treatment instructions: Expect mild to moderate muscle soreness. S/S of pneumothorax if dry needled over a lung field, and to seek immediate medical attention should they occur. Patient verbalized  understanding of these instructions and education. ? ?Patient Consent Given: Yes ?Education handout provided: Yes ?Muscles treated: Rt thoracic paraspinals ?Electrical stimulation performed: No ?Parameters: N/A ?Treatment response/outcome: twitch response noted ? ? ?03/17/22 ?Manual therapy for skilled palpation and active compression with DN to Rt lumbar P.S, and QL. Verbal consent given and education provided. ?Reviewed HEP listed below ?Bike L3 X 10 min ?  ?  ?PATIENT EDUCATION:  ?03/31/2022 ?Education details: HEP progression ?Person educated: Patient ?Education method: Explanation and Demonstration ?Education comprehension: verbalized understanding and needs further education ?  ?  ?HOME EXERCISE PROGRAM: ?03/31/2022 ? Access Code: [email or web address] ?URL: [email or web address] ?Date: 03/31/2022 ?Prepared by: Scot Jun ? ?Exercises ?- Supine Lower Trunk Rotation  - 2 x daily - 6 x weekly - 1 sets - 5 reps - 10 sec hold ?- Hooklying Single Knee to Chest Stretch  -  2 x daily - 6 x weekly - 1 sets - 2 reps - 20 hold ?- Supine Bridge  - 2 x daily - 6 x weekly - 1-2 sets - 10 reps - 5 hold ?- Standing 'L' Stretch at Counter  - 2 x daily - 6 x weekly - 1 sets - 10 reps - 10 hold ?- TL Sidebending Stretch - Single Arm Overhead  - 2 x daily - 6 x weekly - 1 sets - 10 reps - 10 hold ?- Prone Alternating Arm and Leg Lifts  - 1-2 x daily - 7 x weekly - 1-2 sets - 10 reps - 3 hold ?- Hip Flexor Stretch at Edge of Bed  - 1-2 x daily - 7 x weekly - 1 sets - 5 reps - 15 hold ?- Standing Hip Hiking  - 1-2 x daily - 7 x weekly - 1 sets - 10 reps - 3-5 hold ?  ?  ?ASSESSMENT: ?  ?CLINICAL IMPRESSION:  ?Presentation today of mild lateral shift that was easily corrected.  Hip flexor tightness on Rt noted and addressed in home program progression.  Continued skilled PT services indicated to address complaints and improve mobility.  ?  ?OBJECTIVE IMPAIRMENTS: decreased activity tolerance, difficulty walking, decreased balance,  decreased endurance, decreased mobility, decreased ROM, decreased strength, impaired flexibility, postural dysfunction, and pain. ?  ?ACTIVITY LIMITATIONS: bending, lifting, carry, locomotion, cleani

## 2022-04-05 ENCOUNTER — Other Ambulatory Visit: Payer: Self-pay | Admitting: Internal Medicine

## 2022-04-05 ENCOUNTER — Other Ambulatory Visit (HOSPITAL_COMMUNITY): Payer: Self-pay

## 2022-04-05 MED ORDER — METOPROLOL SUCCINATE ER 25 MG PO TB24
25.0000 mg | ORAL_TABLET | Freq: Every day | ORAL | 0 refills | Status: DC
  Filled 2022-04-05: qty 30, 30d supply, fill #0

## 2022-04-06 ENCOUNTER — Other Ambulatory Visit (HOSPITAL_COMMUNITY): Payer: Self-pay

## 2022-04-06 ENCOUNTER — Encounter: Payer: Self-pay | Admitting: Physical Therapy

## 2022-04-06 ENCOUNTER — Ambulatory Visit (INDEPENDENT_AMBULATORY_CARE_PROVIDER_SITE_OTHER): Payer: No Typology Code available for payment source | Admitting: Physical Therapy

## 2022-04-06 DIAGNOSIS — M5459 Other low back pain: Secondary | ICD-10-CM

## 2022-04-06 NOTE — Therapy (Addendum)
?OUTPATIENT PHYSICAL THERAPY TREATMENT NOTE ? ? ?Patient Name: Rickey Cochran ?MRN: [id number] ?DOB:12-11-1960, 61 y.o., male ?Today's Date: 04/06/2022 ? ?PCP: Janith Lima, MD ?REFERRING PROVIDER: Kristeen Miss, MD ? ?END OF SESSION:  ? PT End of Session - 04/06/22 3810   ? ? Visit Number 4   ? Number of Visits 12   ? Date for PT Re-Evaluation 05/12/22   ? PT Start Time [phone]   ? PT Stop Time 0258   ? PT Time Calculation (min) 40 min   ? Activity Tolerance Patient tolerated treatment well   ? Behavior During Therapy [facility] for tasks assessed/performed   ? ?  ?  ? ?  ? ? ? ? ?Past Medical History:  ?Diagnosis Date  ? Arthritis   ? Cancer [facility])   ? possible thyroid cancer, now s/p partial thyroidectomy   ? Depression   ? Dysrhythmia   ? Hypertension   ? several years ago was on lisiopril but lost weight and no longer needs  ? Hypothyroidism   ? mgd on levothyroxine   ? Left-sided tinnitus   ? Premature atrial contraction   ? was seen on EKG at his PCP approx 8 years ago, denies any symptoms, reports he was a runner before onset of hip pain in feb 2020  ? Sleep apnea   ? ?Past Surgical History:  ?Procedure Laterality Date  ? COLONOSCOPY    ? IR THORACENTESIS ASP PLEURAL SPACE W/IMG GUIDE  03/20/2021  ? JOINT REPLACEMENT Right 04/13/2019  ? right total hip arthroplasty  ? THYROID LOBECTOMY Right 05/09/2018  ? THYROID LOBECTOMY Right 05/09/2018  ? Procedure: RIGHT THYROID LOBECTOMY;  Surgeon: Ralene Ok, MD;  Location: [place];  Service: General;  Laterality: Right;  ? TOTAL HIP ARTHROPLASTY Right 04/13/2019  ? Procedure: RIGHT TOTAL HIP ARTHROPLASTY ANTERIOR APPROACH;  Surgeon: Mcarthur Rossetti, MD;  Location: [facility];  Service: Orthopedics;  Laterality: Right;  ? TOTAL KNEE ARTHROPLASTY Left 11/12/2021  ? Procedure: LEFT TOTAL KNEE ARTHROPLASTY;  Surgeon: Meredith Pel, MD;  Location: [place];  Service: Orthopedics;  Laterality: Left;  ? ?Patient Active Problem List  ? Diagnosis Date Noted  ? Lumbar  stenosis with neurogenic claudication 01/04/2022  ? OA (osteoarthritis) of knee 11/12/2021  ? S/P TKR (total knee replacement), left 11/12/2021  ? NSVT (nonsustained ventricular tachycardia) ([place]) 05/20/2021  ? Paroxysmal atrial fibrillation ([place]) 05/20/2021  ? Pleurodynia 03/23/2021  ? Multiple fractures of ribs, right side, subsequent encounter for fracture with delayed healing 03/18/2021  ? Non-toxic multinodular goiter 02/25/2021  ? Nontoxic single thyroid nodule 02/25/2021  ? Personal history of malignant neoplasm of thyroid 02/25/2021  ? Hypothyroidism 02/25/2021  ? Thyroid cancer ([place]) 02/25/2021  ? Screen for colon cancer 02/20/2021  ? Chronic sinus bradycardia 02/17/2021  ? Primary osteoarthritis of both knees 02/17/2021  ? Primary osteoarthritis of both feet 02/17/2021  ? Primary osteoarthritis of left knee 09/23/2020  ? Routine general medical examination at health care facility 01/29/2020  ? Premature atrial contraction   ? BPH associated with nocturia 05/23/2019  ? Depression with anxiety 05/23/2019  ? Postoperative hypothyroidism 05/23/2019  ? Pes planus of both feet 09/11/2015  ? ? ?REFERRING DIAG: M48.062 (ICD-10-CM) - Spinal stenosis, lumbar region with neurogenic claudication ? ?THERAPY DIAG:  ?Other low back pain ? ?PERTINENT HISTORY: S/P lumbar fusion L2-5 01/04/22 ? ?PRECAUTIONS: Back ? ?SUBJECTIVE: Pt indicated he wakes up with more pain in the morning and has "lump" on the Rt side  of his lumbar that is worse in the mornings also ? ?PAIN:  ?NPRS scale: 5/10 this morning  ?Pain location: Rt low back, ?Pain description: tightness, aching ?Aggravating factors: worse in AM ?Relieving factors: walking ? ? ?OBJECTIVE: (objective measures completed at initial evaluation unless otherwise dated) ? ? ?PATIENT SURVEYS:  ?03/17/22 FOTO 51% functional intake ?  ?MUSCLE LENGTH: ?03/31/2022:  (+) thomas on Rt mildly for hip flexor tightness ? ?03/17/2022 Hamstrings: Right 75 deg; Left 75  deg ? ?Posture: ? 03/31/2022:  Very mild Lt lateral shift of trunk on hips ?  ?PALPATION: ?03/17/2022 Mild tenderness to palpation with trigger points along Rt lumbar P.S, QL ?  ?LUMBAR ROM:  ?  ?Active  A/PROM  ?03/17/2022 AROM ?03/31/2022  ?Flexion 50 Movement to top of foot with low back pain noted painful arc  ?Extension 10 50% WFL c no complaint change  ?Right lateral flexion 10   ?Left lateral flexion 10   ?Right rotation 75%   ?Left rotation 75%   ? (Blank rows = not tested) ?  ?LE MMT: WFL 03/17/22 ?  ? ?GAIT: ?03/17/2022 Distance walked: mild limitation in velocity and prolonged tolerance ?  ?  ?TODAY'S TREATMENT  ?04/06/2022: ?Therex: ? Recumbent bike lvl 5 5 mins ? Standing lateral shift correction at wall (Lt side at wall) x 10 ? Standing lumbar extensions 5sec X10 ? Seated lumbar flexion stretch pball roll outs forward 5 sec  X10 and diagonal fwd and left 5 sec X10 ? Supine DKTC stretch 10  sec X 5 with feet on pball ? Supine lumbar rotation stretch 10 sec x 5 bilateral ? Supine Rt thomas test stretch 15 sec x 3 ? Prone opposite arm/leg lift 3 sec hold x 10 bilateral ? ?Manual: ? Percussive device to Rt lumbar in prone X 10 min ? ?03/31/2022: ?Therex: ? Prone opposite arm/leg lift 3 sec hold x 10 bilateral ? Supine SKC to chest 20 sec hold x 2 bilateral ? Supine lumbar rotation stretch 15 sec x 3 bilateral ? Supine Rt thomas test stretch 15 sec x 3 ? Standing lateral shift correction at wall (Lt side at wall) x 10 ? Standing hip hike hold 3 sec x 10 bilateral ? Recumbent bike lvl 3 7 mins ? ? ?Manual: ? Compression to Rt lower lumbar paraspinals , Rt inferior QL.  Palpation during DN ? ?Dry Needing ? Twitch response noted from Rt QL c concordant symptoms.  No concordant symptoms noted from Rt lumbar paraspinals (minimal twitching)  ?  ?  ?PATIENT EDUCATION:  ?03/31/2022 ?Education details: HEP progression ?Person educated: Patient ?Education method: Explanation and Demonstration ?Education comprehension:  verbalized understanding and needs further education ?  ?  ?HOME EXERCISE PROGRAM: ?03/31/2022 ? Access Code: [email or web address] ?URL: [email or web address] ?Date: 03/31/2022 ?Prepared by: Scot Jun ? ?Exercises ?- Supine Lower Trunk Rotation  - 2 x daily - 6 x weekly - 1 sets - 5 reps - 10 sec hold ?- Hooklying Single Knee to Chest Stretch  - 2 x daily - 6 x weekly - 1 sets - 2 reps - 20 hold ?- Supine Bridge  - 2 x daily - 6 x weekly - 1-2 sets - 10 reps - 5 hold ?- Standing 'L' Stretch at Counter  - 2 x daily - 6 x weekly - 1 sets - 10 reps - 10 hold ?- TL Sidebending Stretch - Single Arm Overhead  - 2 x daily - 6 x weekly - 1 sets - 10 reps -  10 hold ?- Prone Alternating Arm and Leg Lifts  - 1-2 x daily - 7 x weekly - 1-2 sets - 10 reps - 3 hold ?- Hip Flexor Stretch at Edge of Bed  - 1-2 x daily - 7 x weekly - 1 sets - 5 reps - 15 hold ?- Standing Hip Hiking  - 1-2 x daily - 7 x weekly - 1 sets - 10 reps - 3-5 hold ?  ?  ?ASSESSMENT: ?  ?CLINICAL IMPRESSION: he does have some sort of lump on his Rt lumbar area, it appears to have some substance to it so may be lipoma vs edema. I did trial percussive device to this area at end of session and he did report some relief in pain after session. He did not have any more PT visits scheduled so I encouraged him to set up some if he wants or he can try independent program and home percussive device and call PT to schedule in the event he needs to. ? ?  ?OBJECTIVE IMPAIRMENTS: decreased activity tolerance, difficulty walking, decreased balance, decreased endurance, decreased mobility, decreased ROM, decreased strength, impaired flexibility, postural dysfunction, and pain. ?  ?ACTIVITY LIMITATIONS: bending, lifting, carry, locomotion, cleaning, community activity, driving, and or occupation ?  ?PERSONAL FACTORS: HTN, Rt THA, Lt TKA are also affecting patient's functional outcome. ?  ?REHAB POTENTIAL: Good ?  ?CLINICAL DECISION MAKING: Stable/uncomplicated ?   ?EVALUATION COMPLEXITY: Low ?  ?  ?  ?GOALS: ?Short term PT Goals Target date: 04/14/2022 ?Pt will be I and compliant with HEP. ?Baseline:  ?Goal status: on going - assessed 03/31/2022 ?Pt will decrease pain and tightness by 25% overall ?Baseline:

## 2022-04-15 ENCOUNTER — Other Ambulatory Visit (HOSPITAL_COMMUNITY): Payer: Self-pay

## 2022-04-15 MED ORDER — LEVOTHYROXINE SODIUM 88 MCG PO TABS
ORAL_TABLET | ORAL | 1 refills | Status: DC
  Filled 2022-04-15 – 2022-05-31 (×2): qty 90, 90d supply, fill #0
  Filled 2022-08-23: qty 90, 90d supply, fill #1

## 2022-04-19 IMAGING — MR MR KNEE*L* W/O CM
6 series · 40 of 40 positions shown · non-contrast
Comparison: Left knee radiograph [DATE]

CLINICAL DATA: MR left knee eval lateral PF compartment

EXAM:
MRI OF THE LEFT KNEE WITHOUT CONTRAST
TECHNIQUE: Multiplanar, multisequence MR imaging of the knee was performed. No
intravenous contrast was administered.

[Series 6: T2 fat-sat · axial · left · 4.0mm · 0.50mm/px · z∈[-39,+114]mm · 8 of 36 slices shown (1 of 3)]
[im 1/36]
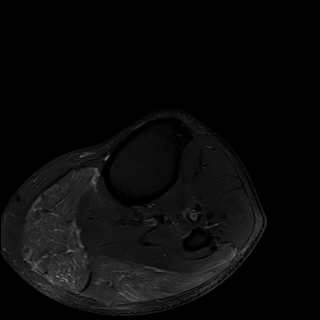
[im 6/36]
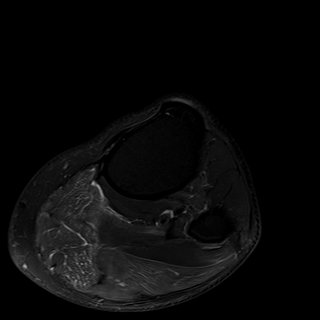
[im 11/36]
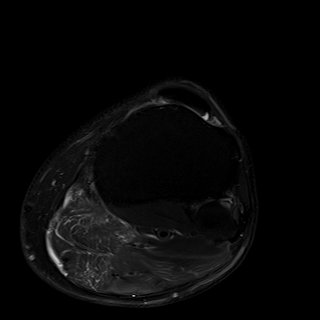
[im 16/36]
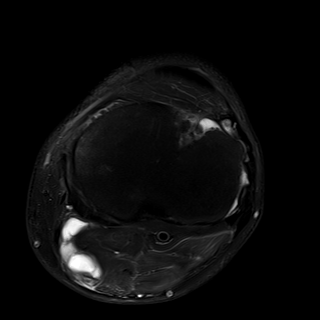
[im 21/36]
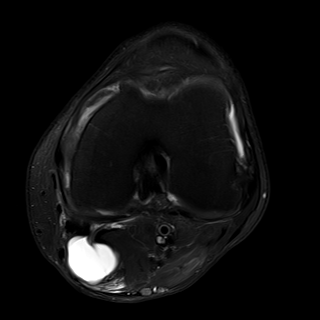
[im 26/36]
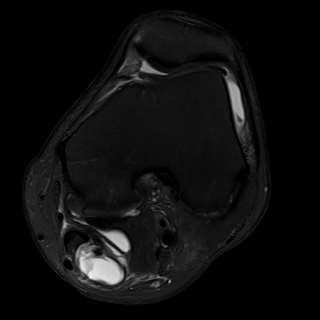
[im 31/36]
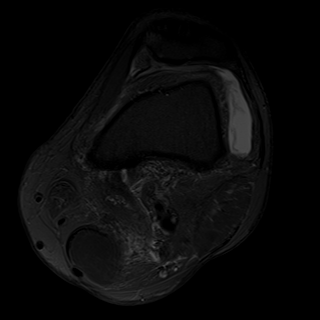
[im 36/36]
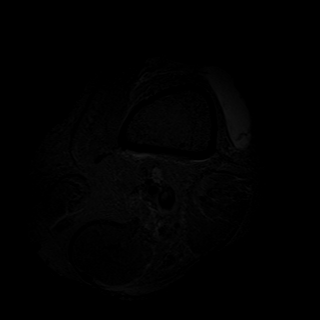

[Series 7: T2 fat-sat · coronal · left · 4.0mm · 0.47mm/px · 7 of 33 slices shown (2 of 3)]
[im 1/33]
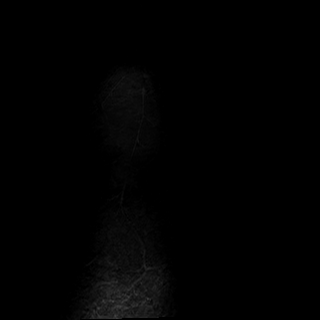
[im 6/33]
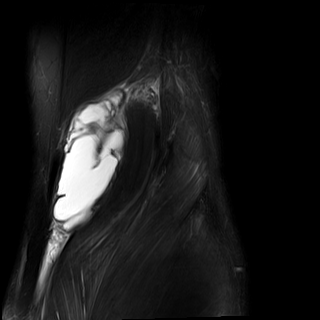
[im 11/33]
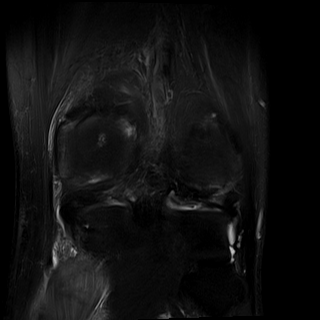
[im 17/33]
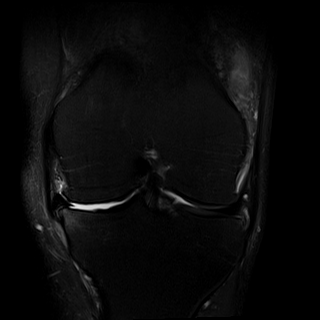
[im 22/33]
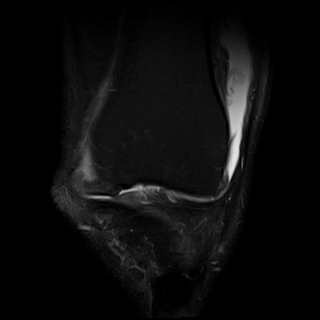
[im 27/33]
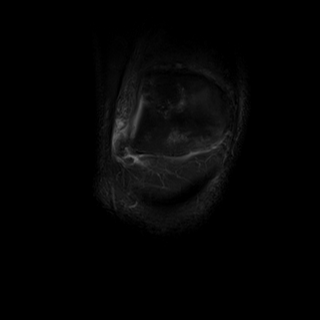
[im 33/33]
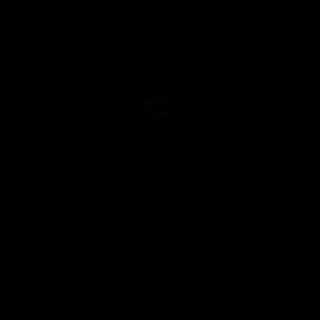

[Series 8: T1 · coronal · left · 4.0mm · 0.47mm/px · 7 of 33 slices shown]
[im 1/33]
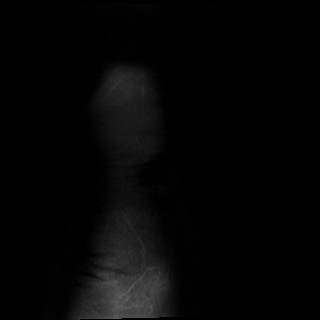
[im 6/33]
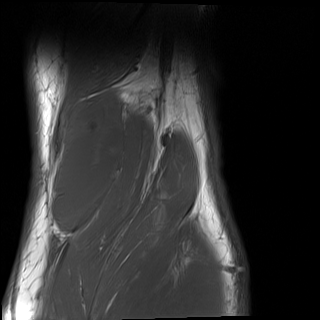
[im 11/33]
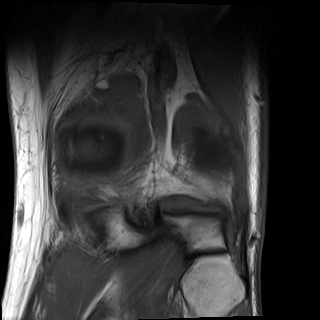
[im 17/33]
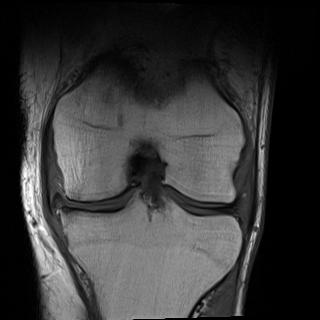
[im 22/33]
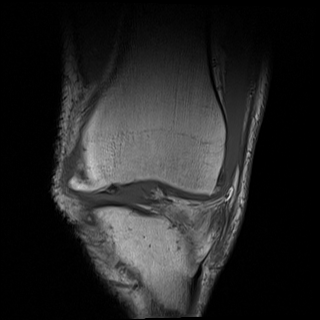
[im 27/33]
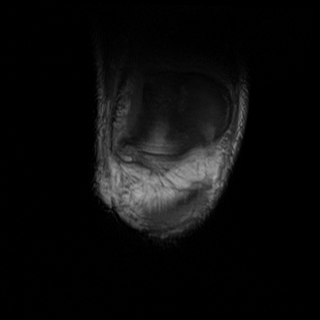
[im 33/33]
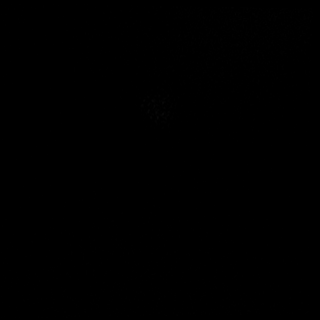

[Series 9: PD fat-sat · coronal · left · 3.0mm · 0.47mm/px · 6 of 28 slices shown (1 of 2)]
[im 1/28]
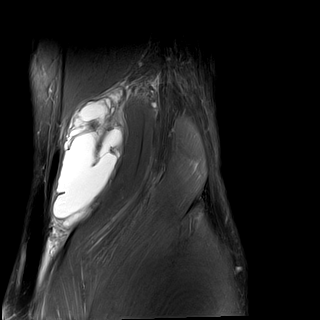
[im 6/28]
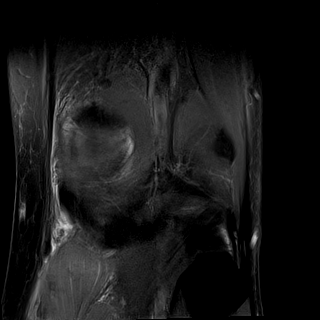
[im 11/28]
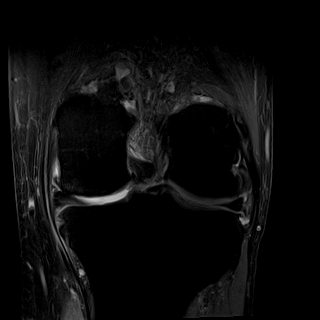
[im 17/28]
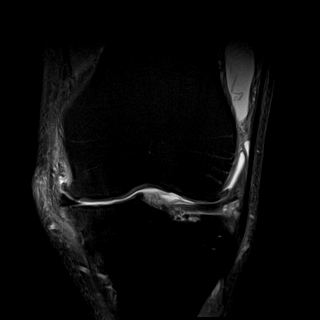
[im 22/28]
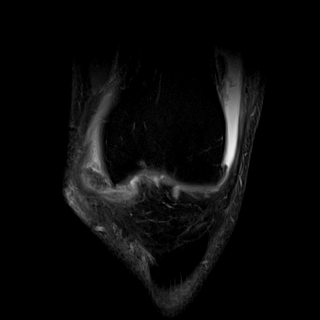
[im 28/28]
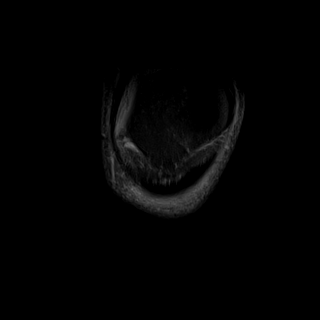

[Series 10: PD fat-sat · sagittal · left · 3.0mm · 0.39mm/px · 6 of 31 slices shown (2 of 2)]
[im 1/31]
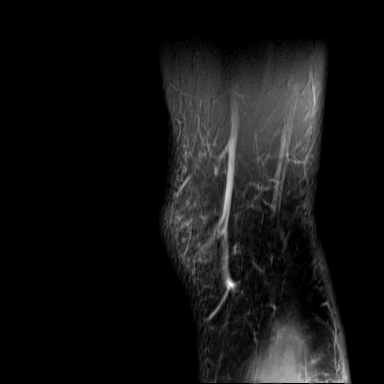
[im 7/31]
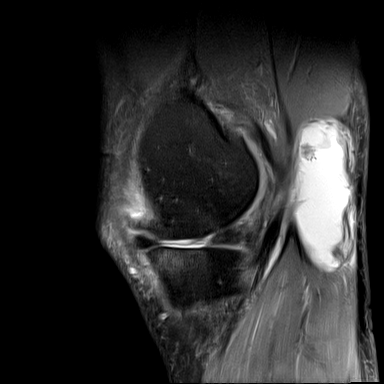
[im 13/31]
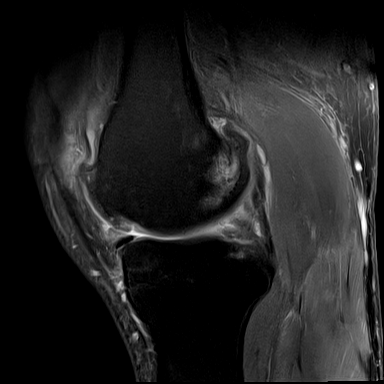
[im 19/31]
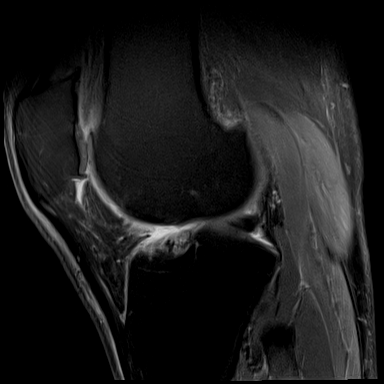
[im 25/31]
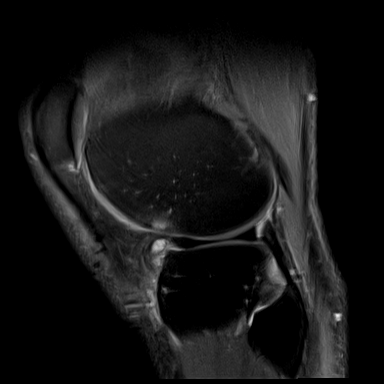
[im 31/31]
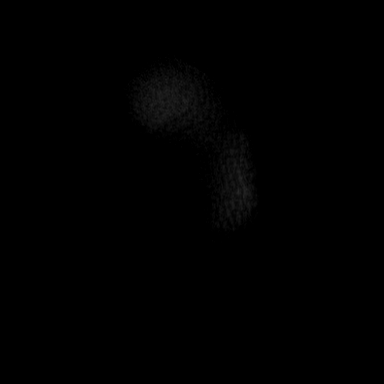

[Series 11: T2 fat-sat · sagittal · left · 3.0mm · 0.39mm/px · 6 of 31 slices shown (3 of 3)]
[im 1/31]
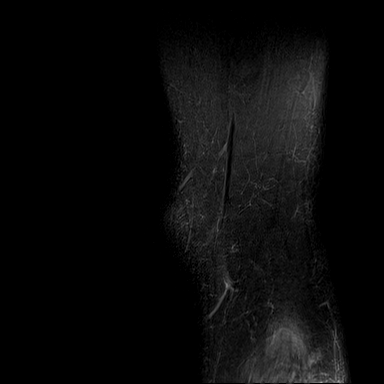
[im 7/31]
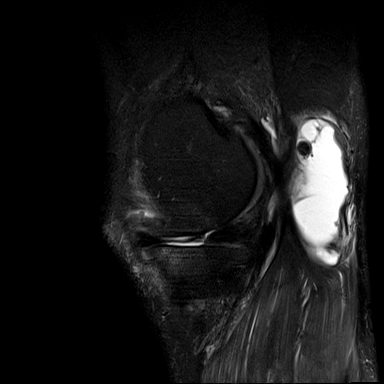
[im 13/31]
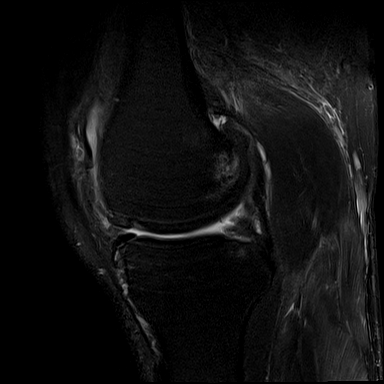
[im 19/31]
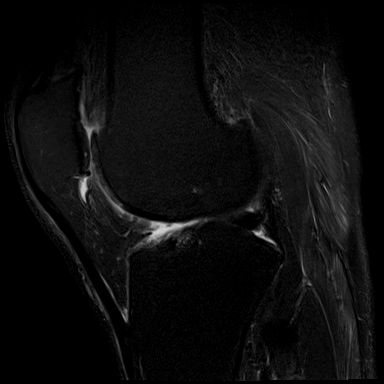
[im 25/31]
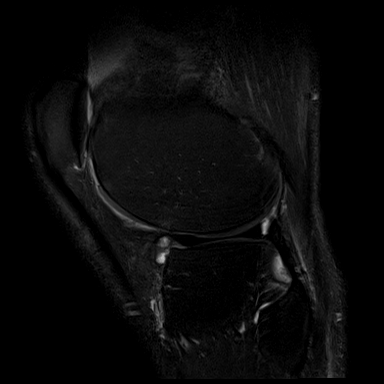
[im 31/31]
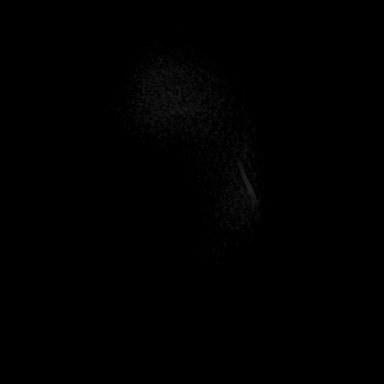

[40 of 40 positions shown; findings below may reference images not displayed]

FINDINGS: MENISCI

Medial: There is a radial tear at the posterior root of the medial
meniscus with mild extrusion and a nondisplaced horizontal tear at
the posterior horn-body junction.

Lateral: Intact.

LIGAMENTS

Cruciates: ACL and PCL are intact.

Collaterals: Medial collateral ligament is intact. Lateral
collateral ligament complex is intact.

CARTILAGE

Patellofemoral: Moderate chondrosis with multifocal intermediate to
high-grade chondral fissuring, partial-thickness cartilage loss, and
joint space narrowing.

Medial: High-grade cartilage loss along the weight-bearing surfaces
of the medial compartment, essentially full-thickness.

Lateral:  Mild chondrosis.

JOINT: Moderate size joint effusion with synovitis.

POPLITEAL FOSSA: Moderate size Baker cyst with evidence of cyst
rupture.

EXTENSOR MECHANISM: Intact quadriceps tendon. Intact patellar
tendon.

BONES: Subchondral marrow edema related to cartilaginous
abnormalities. No acute fracture or dislocation. No aggressive
osseous lesion.

Other: Soft tissue swelling along the posterior knee related to
Baker cyst rupture. There is ganglion cyst formation along the
proximal gastrocnemius origins. Probable parameniscal cyst formation
adjacent to the anterior horn of the lateral meniscus.
IMPRESSION: Radial tear at the posterior root of the medial meniscus with mild
extrusion and focal nondisplaced horizontal tear at the posterior
horn-body junction.

High-grade cartilage loss along the weight-bearing surfaces of the
medial compartment, essentially full-thickness.

Moderate patellofemoral chondrosis with multifocal intermediate to
high-grade chondral fissuring, partial-thickness cartilage loss, and
joint space narrowing.

Moderate-sized joint effusion with synovitis. Moderate size Baker
cyst with evidence of cyst rupture.

## 2022-04-19 IMAGING — MR MR LUMBAR SPINE W/O CM
4 of 5 series · 17 of 48 positions shown · non-contrast
Comparison: Lumbar spine radiographs 10/18/2021

CLINICAL DATA: Chronic low back pain, increasing over the last 3
months

EXAM:
MRI LUMBAR SPINE WITHOUT CONTRAST
TECHNIQUE: Multiplanar, multisequence MR imaging of the lumbar spine was
performed. No intravenous contrast was administered.

[Series 5: T2 · sagittal · 4.0mm · 0.73mm/px · 6 of 21 slices shown (1 of 2)]
[im 1/21]
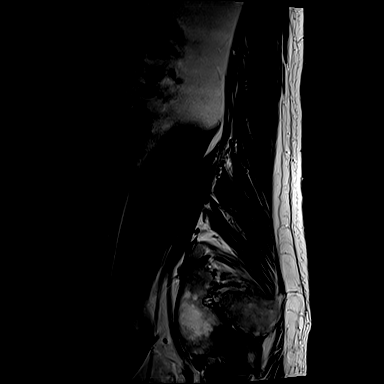
[im 5/21]
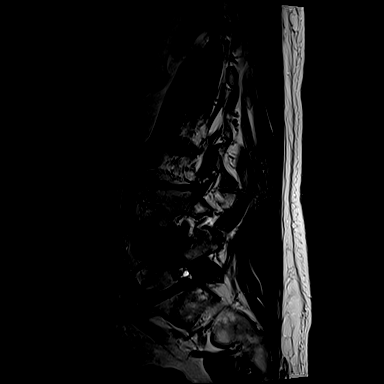
[im 9/21]
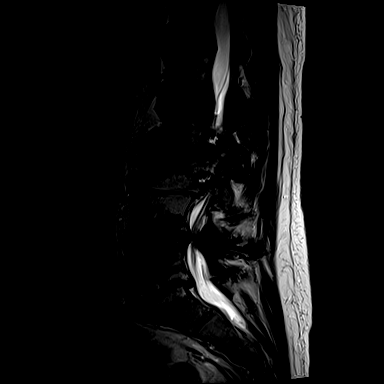
[im 13/21]
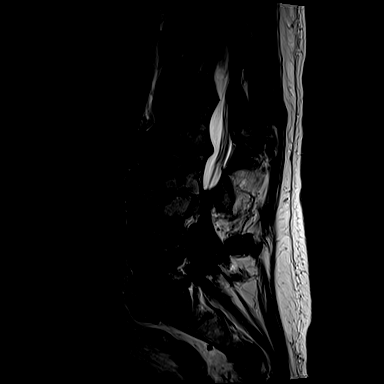
[im 17/21]
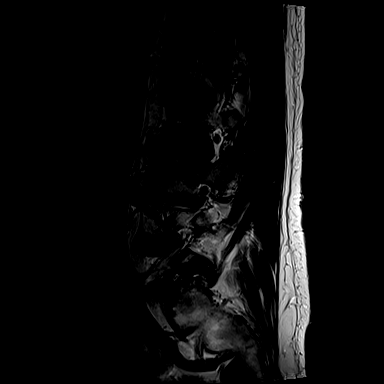
[im 21/21]
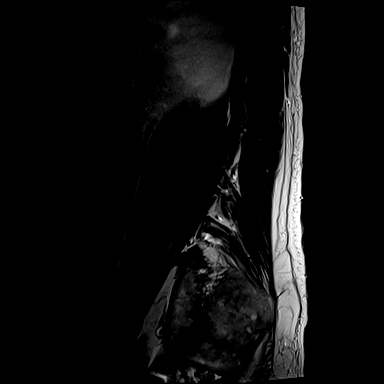

[Series 6: T1 · sagittal · 4.0mm · 0.73mm/px · 3 of 21 slices shown (1 of 2)]
[im 4/21]
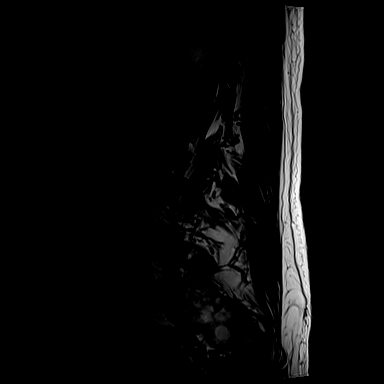
[im 11/21]
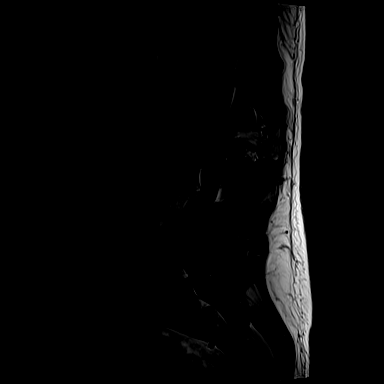
[im 17/21]
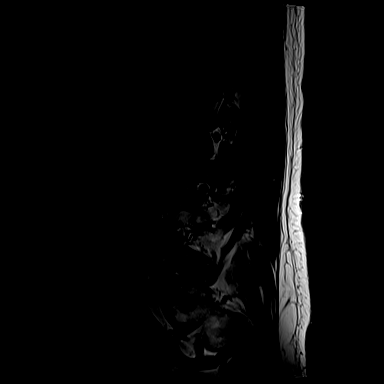

[Series 12: T2 · axial · 4.0mm · 0.28mm/px · z∈[-74,+109]mm · 5 of 44 slices shown (2 of 2)]
[im 1/44]
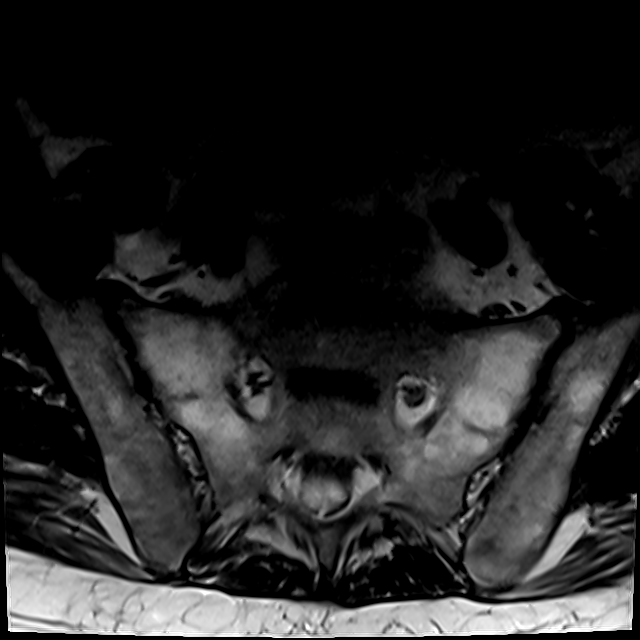
[im 7/44]
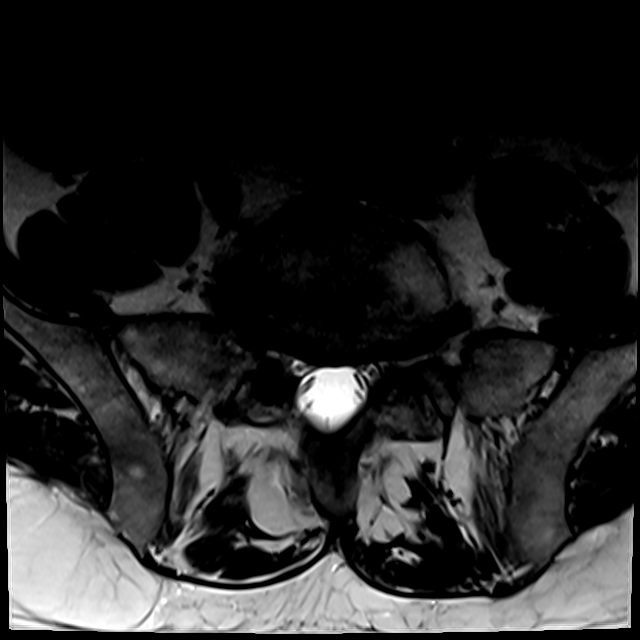
[im 14/44]
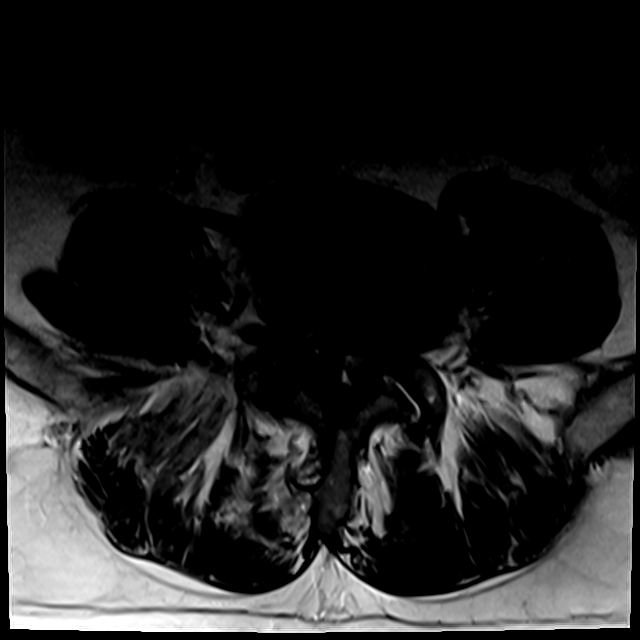
[im 24/44]
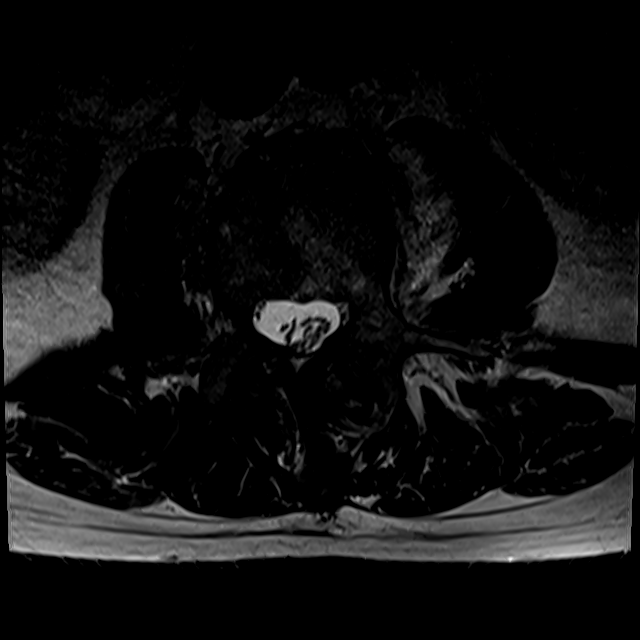
[im 37/44]
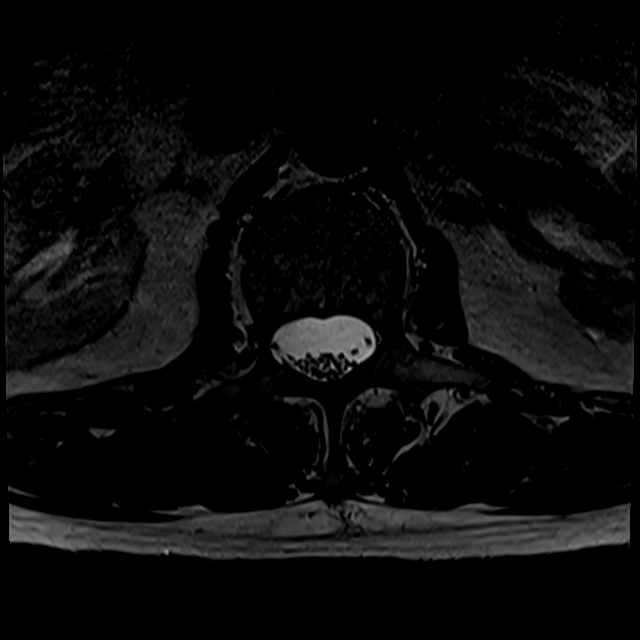

[Series 100: T1 · axial · 4.0mm · 0.28mm/px · z∈[-44,+109]mm · 3 of 46 slices shown (2 of 2)]
[im 7/46]
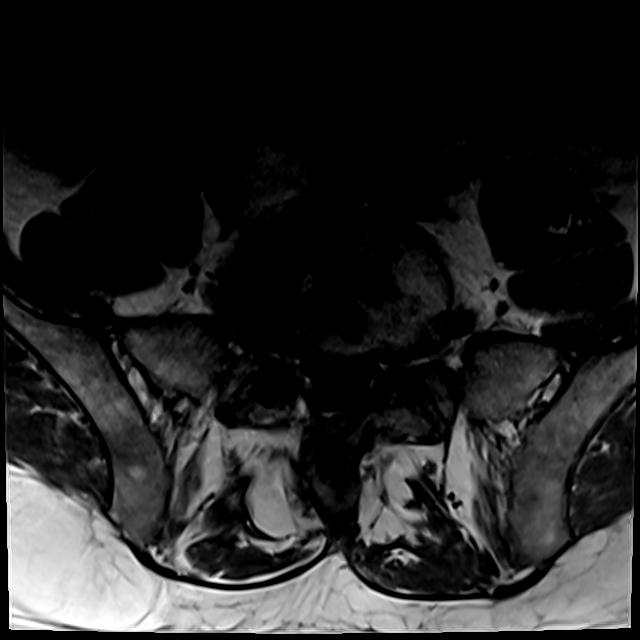
[im 25/46]
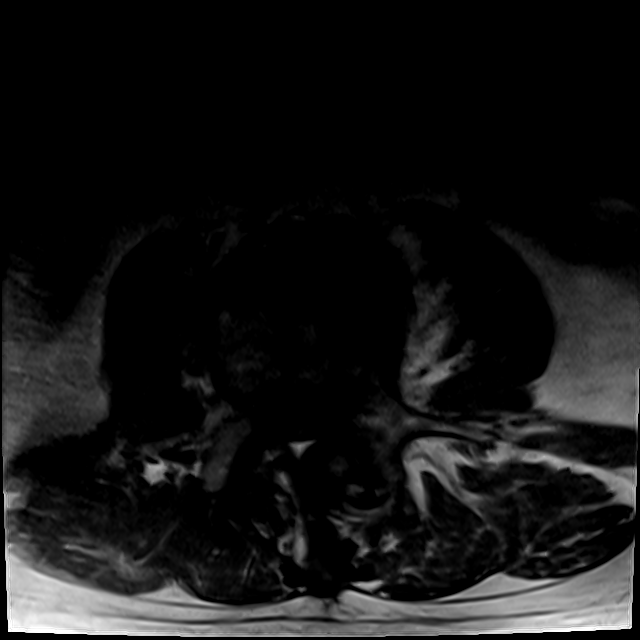
[im 39/46]
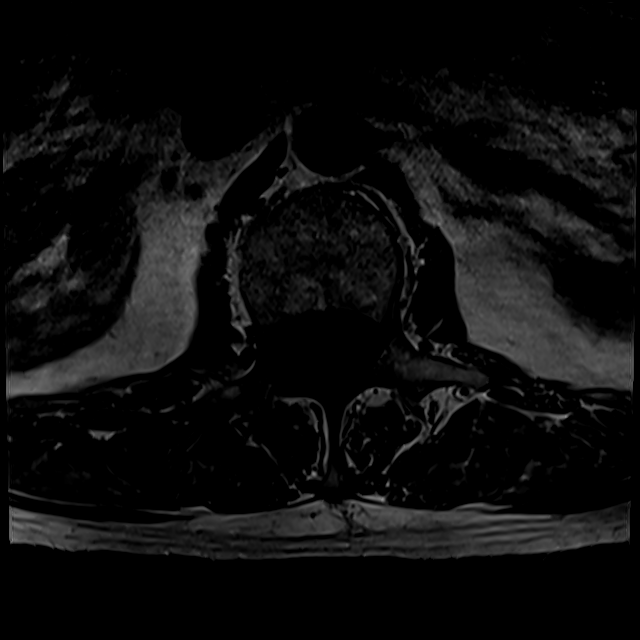

[17 of 48 positions shown; findings below may reference images not displayed]

FINDINGS: Segmentation: Standard; the lowest formed disc space is designated
L5-S1

Alignment: There is trace retrolisthesis of L2 on L3 and grade 1
retrolisthesis of L3 on L4, L4 on L5, and L5 on S1. There is
dextrocurvature centered at L2-L3.

Vertebrae: There is degenerative endplate marrow signal abnormality
at L2-L3 through L5-S1. There is an intraosseous hemangioma in the
L2 vertebral body.

Conus medullaris and cauda equina: Conus extends to the T12-L1
level. Conus and cauda equina appear normal.

Paraspinal and other soft tissues: An exophytic T2 hyperintense
lesion arising from the right kidney most likely reflects a cyst.
The soft tissues are otherwise unremarkable.

Disc levels:

There is marked disc desiccation and narrowing at multiple levels,
most severe at L3-L4 with complete obliteration of the disc space.

There is multilevel facet arthropathy, most advanced at L4-L5 and
L5-S1. There is mild perifacetal soft tissue edema bilaterally at
L4-L5, worse on the right, with an associated small effusion.

T12-L1: No significant spinal canal or neural foraminal stenosis.

L1-L2: There is a right foraminal disc protrusion and mild
degenerative endplate change and facet arthropathy without
significant spinal canal or neural foraminal stenosis. No evidence
of nerve root impingement.

L2-L3: There is a diffuse disc bulge, ligamentum flavum thickening,
degenerative endplate change, and bilateral facet arthropathy
resulting in mild spinal canal stenosis and effacement of the left
subarticular zone with possible impingement of the traversing left
L3 nerve root, and moderate left and no significant right neural
foraminal stenosis.

L3-L4: There is degenerative endplate spurring and bilateral facet
arthropathy resulting in mild spinal canal stenosis with crowding of
the bilateral subarticular zones without definite evidence of nerve
root impingement, and severe bilateral neural foraminal stenosis,
left worse than right.

L4-L5: There is a diffuse disc bulge, ligamentum flavum thickening,
degenerative endplate change, and bulky facet arthropathy resulting
in severe spinal canal stenosis with impingement of the traversing
cauda equina nerve roots and severe right and mild-to-moderate left
neural foraminal stenosis.

L5-S1: There is a mild diffuse disc bulge, degenerative endplate
change, and bilateral facet arthropathy resulting in severe right
worse than left neural foraminal stenosis without significant spinal
canal stenosis.
IMPRESSION: 1. Severe multifactorial spinal canal stenosis at L4-L5 with
impingement of the traversing cauda equina nerve roots and severe
right and mild-to-moderate left neural foraminal stenosis.
2. Mild spinal canal stenosis at L2-L3 with effacement of the left
subarticular zone and possible impingement of the traversing left L3
nerve root. Moderate left and no significant right neural foraminal
stenosis at this level.
3. Severe left worse than right neural foraminal stenosis at L3-L4
and severe right worse than left neural foraminal stenosis at L5-S1.
4. Multilevel facet arthropathy, most advanced at L4-L5 and L5-S1
with perifacetal soft tissue edema bilaterally at L4-L5 with a small
effusion on the right.

## 2022-05-05 ENCOUNTER — Other Ambulatory Visit: Payer: Self-pay | Admitting: Internal Medicine

## 2022-05-05 ENCOUNTER — Other Ambulatory Visit (HOSPITAL_COMMUNITY): Payer: Self-pay

## 2022-05-05 DIAGNOSIS — F418 Other specified anxiety disorders: Secondary | ICD-10-CM

## 2022-05-05 MED ORDER — SERTRALINE HCL 100 MG PO TABS
ORAL_TABLET | Freq: Every day | ORAL | 1 refills | Status: DC
  Filled 2022-05-05: qty 90, 90d supply, fill #0
  Filled 2022-08-05: qty 90, 90d supply, fill #1

## 2022-05-06 ENCOUNTER — Other Ambulatory Visit (HOSPITAL_COMMUNITY): Payer: Self-pay

## 2022-05-06 ENCOUNTER — Other Ambulatory Visit: Payer: Self-pay | Admitting: Internal Medicine

## 2022-05-06 MED ORDER — METOPROLOL SUCCINATE ER 25 MG PO TB24
25.0000 mg | ORAL_TABLET | Freq: Every day | ORAL | 0 refills | Status: DC
  Filled 2022-05-06: qty 15, 15d supply, fill #0

## 2022-05-07 ENCOUNTER — Other Ambulatory Visit (HOSPITAL_COMMUNITY): Payer: Self-pay

## 2022-05-11 ENCOUNTER — Encounter: Payer: Self-pay | Admitting: Internal Medicine

## 2022-05-11 ENCOUNTER — Ambulatory Visit (INDEPENDENT_AMBULATORY_CARE_PROVIDER_SITE_OTHER): Payer: No Typology Code available for payment source | Admitting: Internal Medicine

## 2022-05-11 VITALS — BP 118/72 | HR 58 | Temp 98.1°F | Ht 74.0 in | Wt 214.0 lb

## 2022-05-11 DIAGNOSIS — C73 Malignant neoplasm of thyroid gland: Secondary | ICD-10-CM | POA: Diagnosis not present

## 2022-05-11 DIAGNOSIS — I480 Paroxysmal atrial fibrillation: Secondary | ICD-10-CM

## 2022-05-11 DIAGNOSIS — D539 Nutritional anemia, unspecified: Secondary | ICD-10-CM | POA: Diagnosis not present

## 2022-05-11 DIAGNOSIS — R001 Bradycardia, unspecified: Secondary | ICD-10-CM | POA: Diagnosis not present

## 2022-05-11 DIAGNOSIS — E890 Postprocedural hypothyroidism: Secondary | ICD-10-CM

## 2022-05-11 DIAGNOSIS — E041 Nontoxic single thyroid nodule: Secondary | ICD-10-CM | POA: Diagnosis not present

## 2022-05-11 DIAGNOSIS — Z0000 Encounter for general adult medical examination without abnormal findings: Secondary | ICD-10-CM | POA: Diagnosis not present

## 2022-05-11 LAB — CBC WITH DIFFERENTIAL/PLATELET
Basophils Absolute: 0 10*3/uL (ref 0.0–0.1)
Basophils Relative: 0.4 % (ref 0.0–3.0)
Eosinophils Absolute: 0.1 10*3/uL (ref 0.0–0.7)
Eosinophils Relative: 1.2 % (ref 0.0–5.0)
HCT: 45.3 % (ref 39.0–52.0)
Hemoglobin: 15.3 g/dL (ref 13.0–17.0)
Lymphocytes Relative: 27 % (ref 12.0–46.0)
Lymphs Abs: 1.4 10*3/uL (ref 0.7–4.0)
MCHC: 33.7 g/dL (ref 30.0–36.0)
MCV: 91.5 fl (ref 78.0–100.0)
Monocytes Absolute: 0.3 10*3/uL (ref 0.1–1.0)
Monocytes Relative: 5.6 % (ref 3.0–12.0)
Neutro Abs: 3.3 10*3/uL (ref 1.4–7.7)
Neutrophils Relative %: 65.8 % (ref 43.0–77.0)
Platelets: 209 10*3/uL (ref 150.0–400.0)
RBC: 4.95 Mil/uL (ref 4.22–5.81)
RDW: 14.2 % (ref 11.5–15.5)
WBC: 5.1 10*3/uL (ref 4.0–10.5)

## 2022-05-11 LAB — LIPID PANEL
Cholesterol: 226 mg/dL — ABNORMAL HIGH (ref 0–200)
HDL: 47.9 mg/dL (ref >39.00)
LDL Cholesterol: 153 mg/dL — ABNORMAL HIGH (ref 0–99)
NonHDL: 178.34
Total CHOL/HDL Ratio: 5
Triglycerides: 129 mg/dL (ref 0.0–149.0)
VLDL: 25.8 mg/dL (ref 0.0–40.0)

## 2022-05-11 LAB — HEPATIC FUNCTION PANEL
ALT: 22 U/L (ref 0–53)
AST: 19 U/L (ref 0–37)
Albumin: 4 g/dL (ref 3.5–5.2)
Alkaline Phosphatase: 61 U/L (ref 39–117)
Bilirubin, Direct: 0.1 mg/dL (ref 0.0–0.3)
Total Bilirubin: 1.1 mg/dL (ref 0.2–1.2)
Total Protein: 6.3 g/dL (ref 6.0–8.3)

## 2022-05-11 LAB — IBC + FERRITIN
Ferritin: 17.3 ng/mL — ABNORMAL LOW (ref 22.0–322.0)
Iron: 132 ug/dL (ref 42–165)
Saturation Ratios: 32.6 % (ref 20.0–50.0)
TIBC: 404.6 ug/dL (ref 250.0–450.0)
Transferrin: 289 mg/dL (ref 212.0–360.0)

## 2022-05-11 LAB — PSA: PSA: 1.2 ng/mL (ref 0.10–4.00)

## 2022-05-11 LAB — VITAMIN B12: Vitamin B-12: 467 pg/mL (ref 211–911)

## 2022-05-11 LAB — FOLATE: Folate: 23.9 ng/mL (ref >5.9)

## 2022-05-11 NOTE — Patient Instructions (Signed)
Health Maintenance, Male Adopting a healthy lifestyle and getting preventive care are important in promoting health and wellness. Ask your health care provider about: The right schedule for you to have regular tests and exams. Things you can do on your own to prevent diseases and keep yourself healthy. What should I know about diet, weight, and exercise? Eat a healthy diet  Eat a diet that includes plenty of vegetables, fruits, low-fat dairy products, and lean protein. Do not eat a lot of foods that are high in solid fats, added sugars, or sodium. Maintain a healthy weight Body mass index (BMI) is a measurement that can be used to identify possible weight problems. It estimates body fat based on height and weight. Your health care provider can help determine your BMI and help you achieve or maintain a healthy weight. Get regular exercise Get regular exercise. This is one of the most important things you can do for your health. Most adults should: Exercise for at least 150 minutes each week. The exercise should increase your heart rate and make you sweat (moderate-intensity exercise). Do strengthening exercises at least twice a week. This is in addition to the moderate-intensity exercise. Spend less time sitting. Even light physical activity can be beneficial. Watch cholesterol and blood lipids Have your blood tested for lipids and cholesterol at 61 years of age, then have this test every 5 years. You may need to have your cholesterol levels checked more often if: Your lipid or cholesterol levels are high. You are older than 61 years of age. You are at high risk for heart disease. What should I know about cancer screening? Many types of cancers can be detected early and may often be prevented. Depending on your health history and family history, you may need to have cancer screening at various ages. This may include screening for: Colorectal cancer. Prostate cancer. Skin cancer. Lung  cancer. What should I know about heart disease, diabetes, and high blood pressure? Blood pressure and heart disease High blood pressure causes heart disease and increases the risk of stroke. This is more likely to develop in people who have high blood pressure readings or are overweight. Talk with your health care provider about your target blood pressure readings. Have your blood pressure checked: Every 3-5 years if you are 18-39 years of age. Every year if you are 40 years old or older. If you are between the ages of 65 and 75 and are a current or former smoker, ask your health care provider if you should have a one-time screening for abdominal aortic aneurysm (AAA). Diabetes Have regular diabetes screenings. This checks your fasting blood sugar level. Have the screening done: Once every three years after age 45 if you are at a normal weight and have a low risk for diabetes. More often and at a younger age if you are overweight or have a high risk for diabetes. What should I know about preventing infection? Hepatitis B If you have a higher risk for hepatitis B, you should be screened for this virus. Talk with your health care provider to find out if you are at risk for hepatitis B infection. Hepatitis C Blood testing is recommended for: Everyone born from 1945 through 1965. Anyone with known risk factors for hepatitis C. Sexually transmitted infections (STIs) You should be screened each year for STIs, including gonorrhea and chlamydia, if: You are sexually active and are younger than 61 years of age. You are older than 61 years of age and your   health care provider tells you that you are at risk for this type of infection. Your sexual activity has changed since you were last screened, and you are at increased risk for chlamydia or gonorrhea. Ask your health care provider if you are at risk. Ask your health care provider about whether you are at high risk for HIV. Your health care provider  may recommend a prescription medicine to help prevent HIV infection. If you choose to take medicine to prevent HIV, you should first get tested for HIV. You should then be tested every 3 months for as long as you are taking the medicine. Follow these instructions at home: Alcohol use Do not drink alcohol if your health care provider tells you not to drink. If you drink alcohol: Limit how much you have to 0-2 drinks a day. Know how much alcohol is in your drink. In the [place] one drink equals one 12 oz bottle of beer (355 mL), one 5 oz glass of wine (148 mL), or one 1 oz glass of hard liquor (44 mL). Lifestyle Do not use any products that contain nicotine or tobacco. These products include cigarettes, chewing tobacco, and vaping devices, such as e-cigarettes. If you need help quitting, ask your health care provider. Do not use street drugs. Do not share needles. Ask your health care provider for help if you need support or information about quitting drugs. General instructions Schedule regular health, dental, and eye exams. Stay current with your vaccines. Tell your health care provider if: You often feel depressed. You have ever been abused or do not feel safe at home. Summary Adopting a healthy lifestyle and getting preventive care are important in promoting health and wellness. Follow your health care provider's instructions about healthy diet, exercising, and getting tested or screened for diseases. Follow your health care provider's instructions on monitoring your cholesterol and blood pressure. This information is not intended to replace advice given to you by your health care provider. Make sure you discuss any questions you have with your health care provider. Document Revised: 03/30/2021 Document Reviewed: 03/30/2021 Elsevier Patient Education  2023 [employer]  

## 2022-05-11 NOTE — Progress Notes (Unsigned)
Subjective:  Patient ID: Rickey Cochran, male    DOB: [birth year]  Age: 61 y.o. MRN: [id number]  CC: Annual Exam, Anemia, and Hypothyroidism   HPI Rickey Cochran presents for ***  Outpatient Medications Prior to Visit  Medication Sig Dispense Refill   acetaminophen (TYLENOL) 500 MG tablet Take 1,000 mg by mouth every 6 (six) hours as needed (pain).     alfuzosin (UROXATRAL) 10 MG 24 hr tablet TAKE 1 TABLET BY MOUTH ONCE DAILY WITH BREAKFAST 90 tablet 0   ibuprofen (ADVIL) 800 MG tablet Take 1 tablet (800 mg total) by mouth 3 (three) times daily with food as needed 60 tablet 4   levothyroxine (SYNTHROID) 88 MCG tablet Take 1 tablet by mouth in the morning on an empty stomach 90 tablet 1   metoprolol succinate (TOPROL XL) 25 MG 24 hr tablet Take 1 tablet (25 mg total) by mouth daily.  Call office to schedule an appointment before more refills. 15 tablet 0   Multiple Vitamin (MULTIVITAMIN WITH MINERALS) TABS tablet Take 1 tablet by mouth in the morning.     sertraline (ZOLOFT) 100 MG tablet TAKE 1 TABLET BY MOUTH EVERY NIGHT AT BEDTIME 90 tablet 1   levothyroxine (SYNTHROID) 88 MCG tablet TAKE 1 TABLET BY MOUTH IN THE MORNING ON AN EMPTY STOMACH 90 tablet 1   No facility-administered medications prior to visit.    ROS Review of Systems  HENT: Negative.    Respiratory:  Positive for shortness of breath (DOE only on steep incline). Negative for cough, chest tightness and wheezing.   Cardiovascular:  Negative for chest pain, palpitations and leg swelling.  Gastrointestinal: Negative.   Musculoskeletal:  Negative for arthralgias, back pain, joint swelling and myalgias.  Skin: Negative.   Neurological:  Positive for dizziness and light-headedness. Negative for syncope, speech difficulty and weakness.  Hematological:  Negative for adenopathy. Does not bruise/bleed easily.  Psychiatric/Behavioral: Negative.      Objective:  BP 118/72 (BP Location: Left Arm, Patient Position:  Sitting, Cuff Size: Large)   Pulse (!) 58   Temp 98.1 F (36.7 C) (Oral)   Ht '6\' 2"'$  (1.88 m)   Wt 214 lb (97.1 kg)   SpO2 99%   BMI 27.48 kg/m   BP Readings from Last 3 Encounters:  05/11/22 118/72  03/23/22 115/79  01/05/22 131/76    Wt Readings from Last 3 Encounters:  05/11/22 214 lb (97.1 kg)  03/23/22 217 lb (98.4 kg)  01/04/22 220 lb (99.8 kg)    Physical Exam Vitals reviewed.  HENT:     Nose: Nose normal.     Mouth/Throat:     Mouth: Mucous membranes are moist.  Eyes:     General: No scleral icterus.    Conjunctiva/sclera: Conjunctivae normal.  Neck:     Thyroid: No thyroid mass, thyromegaly or thyroid tenderness.  Cardiovascular:     Rate and Rhythm: Regular rhythm. Bradycardia present. Frequent Extrasystoles are present.    Heart sounds: No murmur heard.    No gallop.     Comments: EKG- SR with occasional PVC/PAC, 75 bpm No LVH or Q waves Pulmonary:     Effort: Pulmonary effort is normal.     Breath sounds: No stridor. No wheezing, rhonchi or rales.  Abdominal:     General: Abdomen is flat.     Palpations: There is no mass.     Tenderness: There is no abdominal tenderness. There is no guarding or rebound.     Hernia:  No hernia is present.  Musculoskeletal:        General: Normal range of motion.     Right lower leg: No edema.     Left lower leg: No edema.  Lymphadenopathy:     Cervical: No cervical adenopathy.     Right cervical: No superficial cervical adenopathy.    Left cervical: No superficial cervical adenopathy.  Skin:    General: Skin is warm and dry.  Neurological:     General: No focal deficit present.     Mental Status: He is alert.  Psychiatric:        Mood and Affect: Mood normal.        Behavior: Behavior normal.     Lab Results  Component Value Date   WBC 5.1 05/11/2022   HGB 15.3 05/11/2022   HCT 45.3 05/11/2022   PLT 209.0 05/11/2022   GLUCOSE 115 (H) 01/05/2022   CHOL 226 (H) 05/11/2022   TRIG 129.0 05/11/2022   HDL  47.90 05/11/2022   LDLDIRECT 151.0 02/17/2021   LDLCALC 153 (H) 05/11/2022   ALT 22 05/11/2022   AST 19 05/11/2022   NA 136 01/05/2022   K 3.8 01/05/2022   CL 102 01/05/2022   CREATININE 0.87 01/05/2022   BUN 14 01/05/2022   CO2 26 01/05/2022   TSH 1.52 02/17/2021   PSA 1.20 05/11/2022   HGBA1C 5.3 05/23/2019    DG Lumbar Spine 2-3 Views  Addendum Date: 01/04/2022   ADDENDUM REPORT: 01/04/2022 14:45 ADDENDUM: Addendum is made for interpretation of intraoperative fluoroscopic images provided for review. Intraoperative fluoroscopic images demonstrate postoperative findings of posterior fusion of L2 through L5 without obvious perihardware fracture or component malpositioning. FLUOROSCOPY: 43.2 seconds 36.46 mGy air kerma Electronically Signed   By: Delanna Ahmadi M.D.   On: 01/04/2022 14:45   Result Date: 01/04/2022 CLINICAL DATA:  Cross-table localization, L2-L3, L3-L4, L4-L5 PLIF EXAM: LUMBAR SPINE - 2-3 VIEW COMPARISON:  None. FINDINGS: Intraoperative lateral radiographs of the lumbar spine demonstrate retractors posteriorly over the L3 and L4 vertebral bodies with a marking instrument overlying the L3-L4 disc space posteriorly. IMPRESSION: Intraoperative lateral radiographs of the lumbar spine demonstrate retractors posteriorly over the L3 and L4 vertebral bodies with a marking instrument overlying the L3-L4 disc space posteriorly. Electronically Signed: By: Delanna Ahmadi M.D. On: 01/04/2022 13:26   DG Lumbar Spine 2-3 Views  Addendum Date: 01/04/2022   ADDENDUM REPORT: 01/04/2022 14:45 ADDENDUM: Addendum is made for interpretation of intraoperative fluoroscopic images provided for review. Intraoperative fluoroscopic images demonstrate postoperative findings of posterior fusion of L2 through L5 without obvious perihardware fracture or component malpositioning. FLUOROSCOPY: 43.2 seconds 36.46 mGy air kerma Electronically Signed   By: Delanna Ahmadi M.D.   On: 01/04/2022 14:45   Result Date:  01/04/2022 CLINICAL DATA:  Cross-table localization, L2-L3, L3-L4, L4-L5 PLIF EXAM: LUMBAR SPINE - 2-3 VIEW COMPARISON:  None. FINDINGS: Intraoperative lateral radiographs of the lumbar spine demonstrate retractors posteriorly over the L3 and L4 vertebral bodies with a marking instrument overlying the L3-L4 disc space posteriorly. IMPRESSION: Intraoperative lateral radiographs of the lumbar spine demonstrate retractors posteriorly over the L3 and L4 vertebral bodies with a marking instrument overlying the L3-L4 disc space posteriorly. Electronically Signed: By: Delanna Ahmadi M.D. On: 01/04/2022 13:26   DG C-Arm 1-60 Min-No Report  Result Date: 01/04/2022 Fluoroscopy was utilized by the requesting physician.  No radiographic interpretation.   DG C-Arm 1-60 Min-No Report  Result Date: 01/04/2022 Fluoroscopy was utilized by the requesting  physician.  No radiographic interpretation.    Assessment & Plan:   Rickey Cochran was seen today for annual exam, anemia and hypothyroidism.  Diagnoses and all orders for this visit:  Postoperative hypothyroidism -     CBC with Differential/Platelet; Future -     Cancel: TSH; Future -     Hepatic function panel; Future -     Hepatic function panel -     CBC with Differential/Platelet  Thyroid cancer (HCC)  Nontoxic single thyroid nodule  Routine general medical examination at health care facility -     Lipid panel; Future -     PSA; Future -     PSA -     Lipid panel  Chronic sinus bradycardia -     EKG 12-Lead -     Hepatic function panel; Future -     Hepatic function panel  Paroxysmal atrial fibrillation (HCC) -     Hepatic function panel; Future -     Hepatic function panel  Deficiency anemia -     IBC + Ferritin; Future -     Vitamin B12; Future -     Folate; Future -     Vitamin B6; Future -     Vitamin B1; Future -     Zinc; Future -     Hepatic function panel; Future -     Hepatic function panel -     Zinc -     Vitamin B1 -      Vitamin B6 -     Folate -     Vitamin B12 -     IBC + Ferritin   I am having Rickey C. Moen "[name]" maintain his multivitamin with minerals, acetaminophen, ibuprofen, alfuzosin, levothyroxine, sertraline, and metoprolol succinate.  No orders of the defined types were placed in this encounter.    Follow-up: Return in about 6 months (around 11/10/2022).  Scarlette Calico, MD

## 2022-05-13 ENCOUNTER — Encounter: Payer: Self-pay | Admitting: Internal Medicine

## 2022-05-13 ENCOUNTER — Other Ambulatory Visit (HOSPITAL_COMMUNITY): Payer: Self-pay

## 2022-05-13 ENCOUNTER — Ambulatory Visit: Payer: No Typology Code available for payment source | Admitting: Internal Medicine

## 2022-05-13 VITALS — BP 134/74 | HR 62 | Ht 74.0 in | Wt 215.2 lb

## 2022-05-13 DIAGNOSIS — Z0000 Encounter for general adult medical examination without abnormal findings: Secondary | ICD-10-CM | POA: Diagnosis not present

## 2022-05-13 DIAGNOSIS — I480 Paroxysmal atrial fibrillation: Secondary | ICD-10-CM | POA: Diagnosis not present

## 2022-05-13 DIAGNOSIS — I4729 Other ventricular tachycardia: Secondary | ICD-10-CM

## 2022-05-13 MED ORDER — METOPROLOL SUCCINATE ER 25 MG PO TB24
25.0000 mg | ORAL_TABLET | Freq: Every day | ORAL | 3 refills | Status: DC
  Filled 2022-05-13 – 2022-05-17 (×2): qty 90, 90d supply, fill #0
  Filled 2022-08-23: qty 90, 90d supply, fill #1
  Filled 2022-11-18 (×3): qty 90, 90d supply, fill #2
  Filled 2023-02-21: qty 90, 90d supply, fill #3
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
## 2022-05-13 NOTE — Patient Instructions (Addendum)
Medication Instructions:  Your physician recommends that you continue on your current medications as directed. Please refer to the Current Medication list given to you today.  Labwork: None ordered.  Testing/Procedures: You will be scheduled for a Cardiac CT score    Follow-Up: Follow up based on results of calcium score.   Any Other Special Instructions Will Be Listed Below (If Applicable).  If you need a refill on your cardiac medications before your next appointment, please call your pharmacy.   Important Information About Sugar

## 2022-05-16 LAB — VITAMIN B1: Vitamin B1 (Thiamine): 14 nmol/L (ref 8–30)

## 2022-05-16 LAB — VITAMIN B6: Vitamin B6: 10.2 ng/mL (ref 2.1–21.7)

## 2022-05-16 LAB — ZINC: Zinc: 61 ug/dL (ref 60–130)

## 2022-05-17 ENCOUNTER — Other Ambulatory Visit: Payer: Self-pay | Admitting: Internal Medicine

## 2022-05-17 ENCOUNTER — Other Ambulatory Visit (HOSPITAL_COMMUNITY): Payer: Self-pay

## 2022-05-17 DIAGNOSIS — N401 Enlarged prostate with lower urinary tract symptoms: Secondary | ICD-10-CM

## 2022-05-17 MED ORDER — ALFUZOSIN HCL ER 10 MG PO TB24
ORAL_TABLET | Freq: Every day | ORAL | 1 refills | Status: DC
  Filled 2022-05-17: qty 90, 90d supply, fill #0
  Filled 2022-08-23: qty 90, 90d supply, fill #1

## 2022-05-18 ENCOUNTER — Other Ambulatory Visit (HOSPITAL_COMMUNITY): Payer: Self-pay

## 2022-05-21 ENCOUNTER — Ambulatory Visit (HOSPITAL_BASED_OUTPATIENT_CLINIC_OR_DEPARTMENT_OTHER)
Admission: RE | Admit: 2022-05-21 | Discharge: 2022-05-21 | Disposition: A | Discharge status: Home / Self Care | Payer: No Typology Code available for payment source | Source: Ambulatory Visit | Attending: Internal Medicine | Admitting: Internal Medicine

## 2022-05-21 DIAGNOSIS — Z0000 Encounter for general adult medical examination without abnormal findings: Secondary | ICD-10-CM | POA: Insufficient documentation

## 2022-05-24 ENCOUNTER — Telehealth: Payer: Self-pay

## 2022-05-24 DIAGNOSIS — E785 Hyperlipidemia, unspecified: Secondary | ICD-10-CM

## 2022-05-28 ENCOUNTER — Other Ambulatory Visit (HOSPITAL_COMMUNITY): Payer: Self-pay

## 2022-05-28 MED ORDER — ROSUVASTATIN CALCIUM 10 MG PO TABS
10.0000 mg | ORAL_TABLET | Freq: Every day | ORAL | 11 refills | Status: DC
  Filled 2022-05-28: qty 90, 90d supply, fill #0
  Filled 2022-08-23: qty 90, 90d supply, fill #1
  Filled 2022-12-06: qty 90, 90d supply, fill #2
  Filled 2023-03-15: qty 90, 90d supply, fill #3

## 2022-05-28 NOTE — Telephone Encounter (Signed)
Work up complete. 

## 2022-05-28 NOTE — Telephone Encounter (Signed)
-----   Message from Evans Lance, MD sent at 05/25/2022  3:51 PM EDT ----- Let him know that his calcium score is elevated ( like mine) and I wouild recommend starting crestor 10 mg daily. If he does this then we would recommend a repeat of his labs in 4 months with fasting lipids and a liver panel.

## 2022-05-31 ENCOUNTER — Other Ambulatory Visit (HOSPITAL_COMMUNITY): Payer: Self-pay

## 2022-06-23 ENCOUNTER — Ambulatory Visit (INDEPENDENT_AMBULATORY_CARE_PROVIDER_SITE_OTHER): Payer: No Typology Code available for payment source | Admitting: Neurology

## 2022-06-23 ENCOUNTER — Encounter: Payer: Self-pay | Admitting: Neurology

## 2022-06-23 VITALS — BP 112/70 | HR 65 | Ht 74.0 in | Wt 212.8 lb

## 2022-06-23 DIAGNOSIS — G4733 Obstructive sleep apnea (adult) (pediatric): Secondary | ICD-10-CM

## 2022-06-23 DIAGNOSIS — I480 Paroxysmal atrial fibrillation: Secondary | ICD-10-CM

## 2022-06-23 DIAGNOSIS — Z9989 Dependence on other enabling machines and devices: Secondary | ICD-10-CM

## 2022-06-23 DIAGNOSIS — Z789 Other specified health status: Secondary | ICD-10-CM

## 2022-06-23 NOTE — Progress Notes (Signed)
Subjective:    Patient ID: Rickey Cochran is a 62 y.o. male.  HPI    Interim history:   Rickey Cochran is a 61 year old right-handed gentleman with an underlying medical history of sinus bradycardia, PACs, osteoarthritis, status post right hip replacement, status post left knee replacement, thyroid cancer with status post surgery, hypothyroidism, hypertension, depression, anxiety, BPH, recent rib fractures in April 5009 with complication of hemothorax, overweight state, paroxysmal A. fib, who presents for follow-up consultation of his obstructive sleep apnea on AutoPap therapy with difficulty tolerating treatment and to discuss inspire candidacy.  The patient is unaccompanied today.  I first met him at the request of his primary care physician on 05/18/2021, at which time the patient reported witnessed apneas as well as snoring and daytime somnolence.  He was advised to proceed with a sleep study.  He had a home sleep test on 06/10/2021 which indicated severe obstructive sleep apnea with a total AHI of 33.2/hour and O2 nadir of 84%, with variable snoring detected.  He was advised to start AutoPap therapy.  He had a follow-up visit with Debbora Presto, NP on 10/13/2021, at which time he reported difficulty tolerating AutoPap, he had used his machine twice in 45 days.  He had a follow-up appointment with Debbora Presto, NP on 03/23/2022, at which time he used his machine 12 out of 30 days with percent use days greater than 4 hours at 7% only in the month of March 2023.  He declined a referral for desensitization with DME provider.  Today, 06/23/2022: He reports that he has not used his machine in a couple of months.  He has trouble tolerating the pressure in the interface.  He has tried 2 or 3 different masks by now, currently has a fullface mask, does not know the exact name of it.  I reviewed his compliance data for the month of May.  He has an Chief Executive Officer with a Soil scientist.  He used his machine 22 out of  30 days with percent use days greater than 4 hours at 50%, average usage for days on treatment of 5.1 hours.  Residual AHI is mildly elevated at 11.1, no significant central events, 95th percentile of pressure at 12.1 cm with a range of 5-15, leak elevated with the 95th percentile at 64.9 L/min.  He does not endorse any benefit from treatment.  He struggles with the machine more than anything.  He is interested in inspire and we talked about the inspire process, the drug-induced endoscopy and the surgical process, healing and subsequent activation today in detail.  The patient's allergies, current medications, family history, past medical history, past social history, past surgical history and problem list were reviewed and updated as appropriate.   Previously:   05/18/21: (He) reports snoring and excessive daytime somnolence, as well as witnessed apneas per wife's feedback.  I reviewed your office note from 02/17/2021.  His Epworth sleepiness score is 11 out of 24, fatigue severity score is 42 out of 63.  His wife is a Marine scientist and has encouraged him to get checked for sleep apnea.  His father has sleep apnea.  His father also has a pacemaker.  His mom has A. fib.  Patient reports not being able to stay awake for long in the evening, falls asleep quickly.  He goes to bed generally between 9 and 10 PM and rise time is generally around 6 AM.  He has nocturia about once or twice per average night but denies  recurrent morning headaches.  He lives with his wife, he has a grown daughter and his stepson recently moved out.  They have 2 dogs in the household.  He typically does not watch TV in the bedroom.  He works in Scientist, clinical (histocompatibility and immunogenetics).  He has reduced his caffeine intake and limit himself to 2 cups of coffee in the mornings.  He does not drink alcohol and quit smoking in 2007.    His Past Medical History Is Significant For: Past Medical History:  Diagnosis Date   Arthritis    Cancer ([place])    possible thyroid  cancer, now s/p partial thyroidectomy    Depression    Dysrhythmia    Hypertension    several years ago was on lisiopril but lost weight and no longer needs   Hypothyroidism    mgd on levothyroxine    Left-sided tinnitus    Premature atrial contraction    was seen on EKG at his PCP approx 8 years ago, denies any symptoms, reports he was a runner before onset of hip pain in feb 2020   Sleep apnea     His Past Surgical History Is Significant For: Past Surgical History:  Procedure Laterality Date   COLONOSCOPY     IR THORACENTESIS ASP PLEURAL SPACE W/IMG GUIDE  03/20/2021   JOINT REPLACEMENT Right 04/13/2019   right total hip arthroplasty   THYROID LOBECTOMY Right 05/09/2018   THYROID LOBECTOMY Right 05/09/2018   Procedure: RIGHT THYROID LOBECTOMY;  Surgeon: Ralene Ok, MD;  Location: [facility];  Service: General;  Laterality: Right;   TOTAL HIP ARTHROPLASTY Right 04/13/2019   Procedure: RIGHT TOTAL HIP ARTHROPLASTY ANTERIOR APPROACH;  Surgeon: Mcarthur Rossetti, MD;  Location: [facility];  Service: Orthopedics;  Laterality: Right;   TOTAL KNEE ARTHROPLASTY Left 11/12/2021   Procedure: LEFT TOTAL KNEE ARTHROPLASTY;  Surgeon: Meredith Pel, MD;  Location: [facility];  Service: Orthopedics;  Laterality: Left;    His Family History Is Significant For: History reviewed. No pertinent family history.  His Social History Is Significant For: Social History   Socioeconomic History   Marital status: Married    Spouse name: Not on file   Number of children: Not on file   Years of education: Not on file   Highest education level: Not on file  Occupational History    Comment: Self employed  Tobacco Use   Smoking status: Former   Smokeless tobacco: Never   Tobacco comments:    QUIT IN 2004  Vaping Use   Vaping Use: Never used  Substance and Sexual Activity   Alcohol use: Not Currently    Alcohol/week: 0.0 standard drinks of alcohol    Comment: quit   Drug use: Never   Sexual  activity: Not on file  Other Topics Concern   Not on file  Social History Narrative   He and his wife are vegan and very active in hiking, backpacking, cycling.   Social Determinants of Health   Financial Resource Strain: Not on file  Food Insecurity: Not on file  Transportation Needs: Not on file  Physical Activity: Not on file  Stress: Not on file  Social Connections: Not on file    His Allergies Are:  No Known Allergies:   His Current Medications Are:  Outpatient Encounter Medications as of 06/23/2022  Medication Sig   acetaminophen (TYLENOL) 500 MG tablet Take 1,000 mg by mouth every 6 (six) hours as needed (pain).   alfuzosin (UROXATRAL) 10 MG 24 hr tablet  TAKE 1 TABLET BY MOUTH ONCE DAILY WITH BREAKFAST   ibuprofen (ADVIL) 800 MG tablet Take 1 tablet (800 mg total) by mouth 3 (three) times daily with food as needed   levothyroxine (SYNTHROID) 88 MCG tablet Take 1 tablet by mouth in the morning on an empty stomach   metoprolol succinate (TOPROL XL) 25 MG 24 hr tablet Take 1 tablet  by mouth daily.   Multiple Vitamin (MULTIVITAMIN WITH MINERALS) TABS tablet Take 1 tablet by mouth in the morning.   rosuvastatin (CRESTOR) 10 MG tablet Take 1 tablet (10 mg total) by mouth daily.   sertraline (ZOLOFT) 100 MG tablet TAKE 1 TABLET BY MOUTH EVERY NIGHT AT BEDTIME   No facility-administered encounter medications on file as of 06/23/2022.  :  Review of Systems:  Out of a complete 14 point review of systems, all are reviewed and negative with the exception of these symptoms as listed below:  Review of Systems  Neurological:        Pt here for follow up with CPAP machine Pt states he is unable to wear mask  Pt states not able to breath well ,air leaking from mask Pt wants to discuss inspire    ESS:7    Objective:  Neurological Exam  Physical Exam Physical Examination:   Vitals:   06/23/22 1334  BP: 112/70  Pulse: 65    General Examination: The patient is a very pleasant  61 y.o. male in no acute distress. He appears well-developed and well-nourished and well groomed.   HEENT: Normocephalic, atraumatic, pupils are equal, round and reactive to light, tracking is well-preserved, face is symmetric with normal facial animation, speech is clear without dysarthria, hypophonia or voice tremor.  Airway examination reveals mild mouth dryness, good dental hygiene, upper top teeth with the bridge, moderate airway crowding.  Tongue protrudes centrally and palate elevates symmetrically.  Unremarkable thyroidectomy scar.  Chest: Clear to auscultation without wheezing, rhonchi or crackles noted.   Heart: S1+S2+0, regular and normal without murmurs, rubs or gallops noted.    Abdomen: Soft, non-tender and non-distended.   Extremities: There is no pitting edema in the distal lower extremities bilaterally.    Skin: Warm and dry without trophic changes noted.    Musculoskeletal: exam reveals no obvious joint deformities, unremarkable scar from left knee replacement.    Neurologically:  Mental status: The patient is awake, alert and oriented in all 4 spheres. His immediate and remote memory, attention, language skills and fund of knowledge are appropriate. There is no evidence of aphasia, agnosia, apraxia or anomia. Speech is clear with normal prosody and enunciation. Thought process is linear. Mood is normal and affect is normal.  Cranial nerves II - XII are as described above under HEENT exam.  Motor exam: Normal bulk, strength and tone is noted. There is no obvious tremor. Fine motor skills and coordination: grossly intact.  Cerebellar testing: No dysmetria or intention tremor. There is no truncal or gait ataxia.  Sensory exam: intact to light touch in the upper and lower extremities.  Gait, station and balance: He stands easily. No veering to one side is noted. No leaning to one side is noted. Posture is age-appropriate and stance is narrow based. Gait shows normal stride length  and normal pace. No problems turning are noted, no walking aid.        Assessment and Plan:  In summary, Rickey Cochran is a very pleasant 61 year old right-handed gentleman with an underlying medical history of sinus bradycardia, PACs,  osteoarthritis, status post right hip replacement, status post left knee replacement, thyroid cancer with status post surgery, hypothyroidism, hypertension, depression, anxiety, BPH, recent rib fractures in April 9450 with complication of hemothorax, overweight state, paroxysmal A. fib, who presents for follow-up consultation of his obstructive sleep apnea on AutoPap therapy with difficulty tolerating treatment and to discuss inspire candidacy. He had a home sleep test on 06/10/2021 which indicated severe obstructive sleep apnea with a total AHI of 33.2/hour and O2 nadir of 84%, with variable snoring detected.  He was advised to start AutoPap therapy.  He has had trouble with AutoPap tolerance.  He has struggled with tolerance of the pressure as well as the interface.  He is willing to try again.  We talked about inspire at length today and the process has been explained in detail, including surgical consultation with ENT, the drug-induced sleep endoscopy, surgical procedure.  I was not able to give details of the surgical procedure, we will defer this to the surgeon of course.  He is agreeable to a consultation with ENT.  In the meantime, he is encouraged to continue to use his AutoPap.  We will also proceed with a reevaluation of his sleep apnea with a laboratory attended sleep study if possible.  He would like to get reassessed.  I will order a laboratory attended sleep study for baseline testing.  We will get in touch with him after authorization is completed and in the meantime we will pursue referral to ENT.  We talked about the risks and ramifications of untreated moderate and severe obstructive sleep apnea and he is advised not to give up on his PAP machine quite yet.   Fortunately, he has not been from treatment.  We will plan a follow-up according to the sleep study results and his consultation with ENT.  I answered all his questions today and he was in agreement. I spent 40 minutes in total face-to-face time and in reviewing records during pre-charting, more than 50% of which was spent in counseling and coordination of care, reviewing test results, reviewing medications and treatment regimen and/or in discussing or reviewing the diagnosis of OSA, the prognosis and treatment options. Pertinent laboratory and imaging test results that were available during this visit with the patient were reviewed by me and considered in my medical decision making (see chart for details).

## 2022-06-23 NOTE — Patient Instructions (Signed)
It was nice to see you again today.  As discussed, I will order a sleep study to be done in the sleep lab.  I have requested a referral to ENT to talk about inspire evaluation.  We will call you to schedule your sleep study.  In the meantime, please continue to use your AutoPap is much as possible, you can make an appointment with your DME provider for a mask refit appointment to discuss a different interface/mask.

## 2022-06-24 ENCOUNTER — Telehealth: Payer: Self-pay | Admitting: Neurology

## 2022-06-24 ENCOUNTER — Ambulatory Visit: Payer: No Typology Code available for payment source | Admitting: Neurology

## 2022-06-24 NOTE — Telephone Encounter (Signed)
Sent to Dr. Redmond Baseman ph # [phone]

## 2022-06-28 ENCOUNTER — Telehealth: Payer: Self-pay | Admitting: Orthopedic Surgery

## 2022-06-28 ENCOUNTER — Other Ambulatory Visit (HOSPITAL_COMMUNITY): Payer: Self-pay

## 2022-06-28 MED ORDER — AMOXICILLIN 500 MG PO CAPS
ORAL_CAPSULE | ORAL | 0 refills | Status: DC
Start: 2022-06-28 — End: 2023-01-10
  Filled 2022-06-28: qty 10, 3d supply, fill #0

## 2022-06-28 NOTE — Telephone Encounter (Signed)
IC advised submitted.  

## 2022-06-28 NOTE — Telephone Encounter (Signed)
Pt called needing antibiotics for upcoming dental appt. Please call pt at [phone] and send to pharmacy on file

## 2022-07-07 ENCOUNTER — Other Ambulatory Visit (HOSPITAL_COMMUNITY): Payer: Self-pay

## 2022-08-06 ENCOUNTER — Other Ambulatory Visit (HOSPITAL_COMMUNITY): Payer: Self-pay

## 2022-08-23 ENCOUNTER — Other Ambulatory Visit (HOSPITAL_COMMUNITY): Payer: Self-pay

## 2022-08-25 ENCOUNTER — Other Ambulatory Visit (HOSPITAL_COMMUNITY): Payer: Self-pay

## 2022-08-30 ENCOUNTER — Telehealth: Payer: Self-pay | Admitting: Internal Medicine

## 2022-08-30 ENCOUNTER — Other Ambulatory Visit (HOSPITAL_COMMUNITY): Payer: Self-pay

## 2022-08-30 MED ORDER — LEVOTHYROXINE SODIUM 88 MCG PO TABS
88.0000 ug | ORAL_TABLET | Freq: Every morning | ORAL | 1 refills | Status: DC
  Filled 2022-08-30: qty 90, 90d supply, fill #0
  Filled 2022-11-29: qty 90, 90d supply, fill #1

## 2022-08-31 ENCOUNTER — Other Ambulatory Visit (HOSPITAL_COMMUNITY): Payer: Self-pay

## 2022-09-01 ENCOUNTER — Other Ambulatory Visit (HOSPITAL_COMMUNITY): Payer: Self-pay

## 2022-09-01 NOTE — Telephone Encounter (Signed)
Verbal given to [employer], pharmacy rep, to switch levothyroxine manufacturer per PCP.

## 2022-09-01 NOTE — Telephone Encounter (Signed)
Patient's wife is calling in stating that Rickey Cochran is switching manufactures for the synthroid, they have faxed several forms over for a signature to okay the switch of medications. They need this in order to fill the medication

## 2022-09-06 ENCOUNTER — Telehealth: Payer: Self-pay | Admitting: Neurology

## 2022-09-06 NOTE — Telephone Encounter (Signed)
[facility]- [facility] no auth req spoke to [facility] ref # [id number].  Patient is scheduled at [facility] for 10/06/22 at 9 pm.  Mailed packet to the patient.

## 2022-09-20 NOTE — Telephone Encounter (Signed)
Noted, it has been canceled.

## 2022-09-20 NOTE — Telephone Encounter (Signed)
Ok to cancel sleep study at this time.

## 2022-09-20 NOTE — Telephone Encounter (Signed)
His next visit with [place] would be for inspire activation after implantation, usually about 6 weeks after the implant, so long as he has healed well.

## 2022-09-20 NOTE — Telephone Encounter (Signed)
Patient called and stated that Dr. Wilburn Cornelia told him he does not need to do the Sleep study at this time on 10/06/22 due to being a candidate for inspire. The patient is wondering when he should have the sleep study after he has the inspire implanted?

## 2022-09-28 ENCOUNTER — Ambulatory Visit: Payer: No Typology Code available for payment source | Attending: Internal Medicine

## 2022-09-28 DIAGNOSIS — E785 Hyperlipidemia, unspecified: Secondary | ICD-10-CM

## 2022-09-29 ENCOUNTER — Other Ambulatory Visit (HOSPITAL_BASED_OUTPATIENT_CLINIC_OR_DEPARTMENT_OTHER): Payer: Self-pay

## 2022-09-29 LAB — HEPATIC FUNCTION PANEL
ALT: 21 IU/L (ref 0–44)
AST: 21 IU/L (ref 0–40)
Albumin: 4.3 g/dL (ref 3.9–4.9)
Alkaline Phosphatase: 62 IU/L (ref 44–121)
Bilirubin Total: 1 mg/dL (ref 0.0–1.2)
Bilirubin, Direct: 0.24 mg/dL (ref 0.00–0.40)
Total Protein: 6.5 g/dL (ref 6.0–8.5)

## 2022-09-29 LAB — LIPID PANEL
Chol/HDL Ratio: 3.1 ratio (ref 0.0–5.0)
Cholesterol, Total: 164 mg/dL (ref 100–199)
HDL: 53 mg/dL (ref >39)
LDL Chol Calc (NIH): 96 mg/dL (ref 0–99)
Triglycerides: 77 mg/dL (ref 0–149)
VLDL Cholesterol Cal: 15 mg/dL (ref 5–40)

## 2022-09-29 MED ORDER — FLUARIX QUADRIVALENT 0.5 ML IM SUSY
PREFILLED_SYRINGE | INTRAMUSCULAR | 0 refills | Status: DC
  Filled 2022-09-29: qty 0.5, 1d supply, fill #0

## 2022-09-29 MED ORDER — COMIRNATY 30 MCG/0.3ML IM SUSY
PREFILLED_SYRINGE | INTRAMUSCULAR | 0 refills | Status: DC
  Filled 2022-09-29: qty 0.3, 1d supply, fill #0

## 2022-09-30 ENCOUNTER — Telehealth: Payer: Self-pay

## 2022-09-30 NOTE — Telephone Encounter (Signed)
Message sent to patient through MyChart addressing Lipid profile lab results.  No follow up or new orders at this time.

## 2022-09-30 NOTE — Telephone Encounter (Signed)
-----   Message from Evans Lance, MD sent at 09/29/2022  9:08 PM EST ----- Cholesterol numbers are much improved. Continue.

## 2022-10-12 ENCOUNTER — Other Ambulatory Visit: Payer: Self-pay | Admitting: Otolaryngology

## 2022-10-20 ENCOUNTER — Other Ambulatory Visit: Payer: Self-pay

## 2022-10-20 ENCOUNTER — Encounter (HOSPITAL_BASED_OUTPATIENT_CLINIC_OR_DEPARTMENT_OTHER): Payer: Self-pay | Admitting: Otolaryngology

## 2022-10-20 NOTE — Progress Notes (Signed)
   10/20/22 1441  PAT Phone Screen  Is the patient taking a GLP-1 receptor agonist? No  Do You Have Diabetes? No  Do You Have Hypertension? Yes  Have You Ever Been to the ER for Asthma? No  Have You Taken Oral Steroids in the Past 3 Months? No  Do you Take Phenteramine or any Other Diet Drugs? No  Recent  Lab Work, EKG, CXR? Yes  Where was this test performed? 05-11-22 EKG  Do you have a history of heart problems? (S)  Yes (brief hx PAF, PVC's, PAC's in 2022)  Cardiologist Name Dr Cristopher Peru  Have you ever had tests on your heart? Yes  What cardiac tests were performed? (S)  Echo;Other (comment) (CT Coronary)  What date/year were cardiac tests completed? 04-28-21 ECHO EF 65-70%, 05-21-22 CT coronary Ca score 277  Results viewable: [facility] Media Tab  Any Recent Hospitalizations? No  Height '6\' 2"'$  (1.88 m)  Weight 95.3 kg  Pat Appointment Scheduled No  Reason for No Appointment Not Needed

## 2022-10-27 ENCOUNTER — Encounter (HOSPITAL_BASED_OUTPATIENT_CLINIC_OR_DEPARTMENT_OTHER)
Admission: RE | Disposition: A | Discharge status: Home / Self Care | Payer: Self-pay | Source: Home / Self Care | Attending: Otolaryngology

## 2022-10-27 ENCOUNTER — Encounter (HOSPITAL_BASED_OUTPATIENT_CLINIC_OR_DEPARTMENT_OTHER): Payer: Self-pay | Admitting: Otolaryngology

## 2022-10-27 ENCOUNTER — Ambulatory Visit (HOSPITAL_BASED_OUTPATIENT_CLINIC_OR_DEPARTMENT_OTHER): Payer: No Typology Code available for payment source | Admitting: Certified Registered"

## 2022-10-27 ENCOUNTER — Other Ambulatory Visit: Payer: Self-pay

## 2022-10-27 ENCOUNTER — Ambulatory Visit (HOSPITAL_BASED_OUTPATIENT_CLINIC_OR_DEPARTMENT_OTHER)
Admission: RE | Admit: 2022-10-27 | Discharge: 2022-10-27 | Disposition: A | Discharge status: Home / Self Care | Payer: No Typology Code available for payment source | Attending: Otolaryngology | Admitting: Otolaryngology

## 2022-10-27 DIAGNOSIS — Z79899 Other long term (current) drug therapy: Secondary | ICD-10-CM | POA: Diagnosis not present

## 2022-10-27 DIAGNOSIS — I10 Essential (primary) hypertension: Secondary | ICD-10-CM | POA: Insufficient documentation

## 2022-10-27 DIAGNOSIS — E039 Hypothyroidism, unspecified: Secondary | ICD-10-CM | POA: Diagnosis not present

## 2022-10-27 DIAGNOSIS — Z87891 Personal history of nicotine dependence: Secondary | ICD-10-CM | POA: Diagnosis not present

## 2022-10-27 DIAGNOSIS — Z79890 Hormone replacement therapy (postmenopausal): Secondary | ICD-10-CM | POA: Diagnosis not present

## 2022-10-27 DIAGNOSIS — G4733 Obstructive sleep apnea (adult) (pediatric): Secondary | ICD-10-CM

## 2022-10-27 DIAGNOSIS — Z01818 Encounter for other preprocedural examination: Secondary | ICD-10-CM

## 2022-10-27 HISTORY — DX: Benign prostatic hyperplasia without lower urinary tract symptoms: N40.0

## 2022-10-27 HISTORY — PX: DRUG INDUCED ENDOSCOPY: SHX6808

## 2022-10-27 SURGERY — DRUG INDUCED SLEEP ENDOSCOPY
Anesthesia: General | Site: Nose

## 2022-10-27 MED ORDER — PROPOFOL 500 MG/50ML IV EMUL
INTRAVENOUS | Status: AC
  Filled 2022-10-27: qty 50

## 2022-10-27 MED ORDER — ONDANSETRON HCL 4 MG/2ML IJ SOLN
INTRAMUSCULAR | Status: AC
  Filled 2022-10-27: qty 2

## 2022-10-27 MED ORDER — LACTATED RINGERS IV SOLN: INTRAVENOUS | Status: DC

## 2022-10-27 MED ORDER — LIDOCAINE HCL (CARDIAC) PF 100 MG/5ML IV SOSY
PREFILLED_SYRINGE | INTRAVENOUS | Status: DC | PRN
  Administered 2022-10-27: 100 mg via INTRAVENOUS

## 2022-10-27 MED ORDER — PROPOFOL 500 MG/50ML IV EMUL
INTRAVENOUS | Status: DC | PRN
  Administered 2022-10-27: 50 ug/kg/min via INTRAVENOUS

## 2022-10-27 MED ORDER — FENTANYL CITRATE (PF) 100 MCG/2ML IJ SOLN: 25.0000 ug | INTRAMUSCULAR | Status: DC | PRN

## 2022-10-27 MED ORDER — ONDANSETRON HCL 4 MG/2ML IJ SOLN: 4.0000 mg | Freq: Once | INTRAMUSCULAR | Status: DC | PRN

## 2022-10-27 MED ORDER — PROPOFOL 10 MG/ML IV BOLUS
INTRAVENOUS | Status: DC | PRN
  Administered 2022-10-27: 10 mg via INTRAVENOUS
  Administered 2022-10-27 (×2): 20 mg via INTRAVENOUS

## 2022-10-27 SURGICAL SUPPLY — 12 items
CANISTER SUCT 1200ML W/VALVE (MISCELLANEOUS) ×1 IMPLANT
GLOVE BIOGEL M 7.0 STRL (GLOVE) ×1 IMPLANT
KIT CLEAN ENDO (MISCELLANEOUS) ×1 IMPLANT
NDL HYPO 27GX1-1/4 (NEEDLE) IMPLANT
NEEDLE HYPO 27GX1-1/4 (NEEDLE) IMPLANT
PATTIES SURGICAL .5 X3 (DISPOSABLE) IMPLANT
SHEET MEDIUM DRAPE 40X70 STRL (DRAPES) ×1 IMPLANT
SOL ANTI FOG 6CC (MISCELLANEOUS) ×1 IMPLANT
SPONGE NEURO XRAY DETECT 1X3 (DISPOSABLE) IMPLANT
SYR CONTROL 10ML LL (SYRINGE) IMPLANT
TOWEL GREEN STERILE FF (TOWEL DISPOSABLE) ×1 IMPLANT
TUBE CONNECTING 20X1/4 (TUBING) IMPLANT

## 2022-10-27 NOTE — Transfer of Care (Signed)
Immediate Anesthesia Transfer of Care Note  Patient: Rickey Cochran  Procedure(s) Performed: DRUG INDUCED SLEEP ENDOSCOPY (Nose)  Patient Location: PACU  Anesthesia Type:MAC  Level of Consciousness: awake, alert , and oriented  Airway & Oxygen Therapy: Patient Spontanous Breathing and Patient connected to face mask oxygen  Post-op Assessment: Report given to RN and Post -op Vital signs reviewed and stable  Post vital signs: Reviewed and stable  Last Vitals:   HR: 67 BP: 90/59 (70) SpO2: 98% RR: 14 Vitals Value Taken Time  BP    Temp    Pulse    Resp    SpO2      Last Pain:  Vitals:   10/27/22 0835  TempSrc: Oral  PainSc: 0-No pain      Patients Stated Pain Goal: 7 (46/65/99 3570)  Complications: No notable events documented.

## 2022-10-27 NOTE — Anesthesia Preprocedure Evaluation (Signed)
Anesthesia Evaluation  Patient identified by MRN, date of birth, ID band Patient awake    Reviewed: Allergy & Precautions, H&P , NPO status , Patient's Chart, lab work & pertinent test results  Airway Mallampati: II  TM Distance: >3 FB Neck ROM: Full    Dental no notable dental hx.    Pulmonary sleep apnea , former smoker   Pulmonary exam normal breath sounds clear to auscultation       Cardiovascular hypertension, Normal cardiovascular exam Rhythm:Regular Rate:Normal     Neuro/Psych negative neurological ROS  negative psych ROS   GI/Hepatic negative GI ROS, Neg liver ROS,,,  Endo/Other  Hypothyroidism    Renal/GU negative Renal ROS  negative genitourinary   Musculoskeletal negative musculoskeletal ROS (+)    Abdominal   Peds negative pediatric ROS (+)  Hematology negative hematology ROS (+)   Anesthesia Other Findings   Reproductive/Obstetrics negative OB ROS                             Anesthesia Physical Anesthesia Plan  ASA: 2  Anesthesia Plan: General   Post-op Pain Management: Minimal or no pain anticipated   Induction: Intravenous  PONV Risk Score and Plan: 2 and Ondansetron, Dexamethasone and Treatment may vary due to age or medical condition  Airway Management Planned: Mask  Additional Equipment:   Intra-op Plan:   Post-operative Plan:   Informed Consent: I have reviewed the patients History and Physical, chart, labs and discussed the procedure including the risks, benefits and alternatives for the proposed anesthesia with the patient or authorized representative who has indicated his/her understanding and acceptance.     Dental advisory given  Plan Discussed with: CRNA and Surgeon  Anesthesia Plan Comments:        Anesthesia Quick Evaluation

## 2022-10-27 NOTE — Discharge Instructions (Signed)
  Post Anesthesia Home Care Instructions  Activity: Get plenty of rest for the remainder of the day. A responsible individual must stay with you for 24 hours following the procedure.  For the next 24 hours, DO NOT: -Drive a car -Paediatric nurse -Drink alcoholic beverages -Take any medication unless instructed by your physician -Make any legal decisions or sign important papers.  Meals: Start with liquid foods such as gelatin or soup. Progress to regular foods as tolerated. Avoid greasy, spicy, heavy foods. If nausea and/or vomiting occur, drink only clear liquids until the nausea and/or vomiting subsides. Call your physician if vomiting continues.  Special Instructions/Symptoms: Your throat may feel dry or sore from the anesthesia or the breathing tube placed in your throat during surgery. If this causes discomfort, gargle with warm salt water. The discomfort should disappear within 24 hours.  If you had a scopolamine patch placed behind your ear for the management of post- operative nausea and/or vomiting:  1. The medication in the patch is effective for 72 hours, after which it should be removed.  Wrap patch in a tissue and discard in the trash. Wash hands thoroughly with soap and water. 2. You may remove the patch earlier than 72 hours if you experience unpleasant side effects which may include dry mouth, dizziness or visual disturbances. 3. Avoid touching the patch. Wash your hands with soap and water after contact with the patch.    Post Anesthesia Home Care Instructions  Activity: Get plenty of rest for the remainder of the day. A responsible individual must stay with you for 24 hours following the procedure.  For the next 24 hours, DO NOT: -Drive a car -Paediatric nurse -Drink alcoholic beverages -Take any medication unless instructed by your physician -Make any legal decisions or sign important papers.  Meals: Start with liquid foods such as gelatin or soup. Progress to  regular foods as tolerated. Avoid greasy, spicy, heavy foods. If nausea and/or vomiting occur, drink only clear liquids until the nausea and/or vomiting subsides. Call your physician if vomiting continues.  Special Instructions/Symptoms: Your throat may feel dry or sore from the anesthesia or the breathing tube placed in your throat during surgery. If this causes discomfort, gargle with warm salt water. The discomfort should disappear within 24 hours.  If you had a scopolamine patch placed behind your ear for the management of post- operative nausea and/or vomiting:  1. The medication in the patch is effective for 72 hours, after which it should be removed.  Wrap patch in a tissue and discard in the trash. Wash hands thoroughly with soap and water. 2. You may remove the patch earlier than 72 hours if you experience unpleasant side effects which may include dry mouth, dizziness or visual disturbances. 3. Avoid touching the patch. Wash your hands with soap and water after contact with the patch.

## 2022-10-27 NOTE — Op Note (Signed)
Operative Note: DRUG INDUCED SLEEP ENDOSCOPY  Patient: Rickey Cochran record number: [id number]  Date:10/27/2022  Pre-operative Indications: 1.  Obstructive Sleep Apnea  Postoperative Indications: Same  Surgical Procedure: 1.  Drug Induced Sleep Endoscopy (DISE)  Anesthesia: MAC with IV sedation  Surgeon: Delsa Bern, M.D.  Complications: None  BMI: 26.8 kg/m  EBL: None  Findings: There is no evidence of complete concentric palatal obstruction.  Anatomically the patient should be a candidate for hypoglossal nerve stimulation therapy.     Brief History: The patient is a 61 y.o. male with a history of obstructive sleep apnea. The patient has undergone previous sleep study which showed mild to moderate levels of obstructive sleep apnea.  The patient was prescribed CPAP which they consistently attempted to use without success.  Given the patient's history and findings, the above drug-induced sleep endoscopy was recommended to assess the patient's anatomic level of apnea.  Risks and benefits were discussed in detail with the patient today understand and agree with our plan for surgery which is scheduled at [facility] under sedated anesthesia as an outpatient.  Surgical Procedure: The patient is brought to the operating room on 10/27/2022 and placed in supine position on the operating table.  Intravenous sedated anesthesia was established without difficulty using the standard drug-induced sleep endoscopy protocol. When the patient was adequately anesthetized, surgical timeout was performed and correct identification of the patient and the surgical procedure.    A propofol infusion was administered and the patient was monitored carefully to achieve a level of sedation appropriate for DISE.  The patient did not respond to verbal commands but still had spontaneous respiration, sleep disordered breathing and associated desaturations were observed.  With the patient under adequate  sedated anesthesia the flexible nasal laryngoscope was passed without difficulty.  The patient's nasal cavity showed patent without significant deviated septum or obstruction.  The endoscope was then passed to visualize the velopharynx, oropharynx, tongue base and epiglottis to assess areas of obstruction.  Patient's airway showed anterior to posterior obstruction at the level of the palate and base of tongue.   There was no evidence of complete concentric palatal obstruction and the patient appeared to be a candidate anatomically for hypoglossal nerve stimulation therapy.  Surgical sponge count was correct. Patient was awakened from anesthetic and transferred from the operating room to the recovery room in stable condition. There were no complications and no blood loss.   Delsa Bern, M.D. [facility] 10/27/2022

## 2022-10-27 NOTE — Anesthesia Postprocedure Evaluation (Signed)
Anesthesia Post Note  Patient: Rickey Cochran  Procedure(s) Performed: DRUG INDUCED SLEEP ENDOSCOPY (Nose)     Patient location during evaluation: PACU Anesthesia Type: General Level of consciousness: awake and alert Pain management: pain level controlled Vital Signs Assessment: post-procedure vital signs reviewed and stable Respiratory status: spontaneous breathing, nonlabored ventilation, respiratory function stable and patient connected to nasal cannula oxygen Cardiovascular status: blood pressure returned to baseline and stable Postop Assessment: no apparent nausea or vomiting Anesthetic complications: no  No notable events documented.  Last Vitals:  Vitals:   10/27/22 1030 10/27/22 1046  BP: (!) 90/59 110/79  Pulse: 61 (!) 47  Resp: 14 20  Temp: (!) 36.4 C   SpO2: 99% 98%    Last Pain:  Vitals:   10/27/22 1046  TempSrc:   PainSc: 0-No pain                 Carmina Walle S

## 2022-10-27 NOTE — H&P (Signed)
Rickey Cochran is an 61 y.o. male.   Chief Complaint: Obstructive sleep apnea HPI: History of obstructive sleep apnea with significant level of airway obstruction.  Patient unable to tolerate CPAP for long-term management.  Past Medical History:  Diagnosis Date   Arthritis    BPH (benign prostatic hyperplasia)    Cancer ([place])    s/p partial thyroidectomy   Depression    Dysrhythmia    Hypertension    several years ago was on lisiopril but lost weight and no longer needs   Hypothyroidism    mgd on levothyroxine    Left-sided tinnitus    Premature atrial contraction    was seen on EKG at his PCP approx 8 years ago, denies any symptoms, reports he was a runner before onset of hip pain in feb 2020   Sleep apnea    not using CPAP    Past Surgical History:  Procedure Laterality Date   COLONOSCOPY     IR THORACENTESIS ASP PLEURAL SPACE W/IMG GUIDE  03/20/2021   JOINT REPLACEMENT Right 04/13/2019   right total hip arthroplasty   THYROID LOBECTOMY Right 05/09/2018   THYROID LOBECTOMY Right 05/09/2018   Procedure: RIGHT THYROID LOBECTOMY;  Surgeon: Ralene Ok, MD;  Location: [facility];  Service: General;  Laterality: Right;   TOTAL HIP ARTHROPLASTY Right 04/13/2019   Procedure: RIGHT TOTAL HIP ARTHROPLASTY ANTERIOR APPROACH;  Surgeon: Mcarthur Rossetti, MD;  Location: [facility];  Service: Orthopedics;  Laterality: Right;   TOTAL KNEE ARTHROPLASTY Left 11/12/2021   Procedure: LEFT TOTAL KNEE ARTHROPLASTY;  Surgeon: Meredith Pel, MD;  Location: [facility];  Service: Orthopedics;  Laterality: Left;    History reviewed. No pertinent family history. Social History:  reports that he has quit smoking. He has never used smokeless tobacco. He reports that he does not currently use alcohol. He reports that he does not use drugs.  Allergies: No Known Allergies  Medications Prior to Admission  Medication Sig Dispense Refill   acetaminophen (TYLENOL) 500 MG tablet Take 1,000 mg by mouth  every 6 (six) hours as needed (pain).     alfuzosin (UROXATRAL) 10 MG 24 hr tablet TAKE 1 TABLET BY MOUTH ONCE DAILY WITH BREAKFAST 90 tablet 1   ibuprofen (ADVIL) 800 MG tablet Take 1 tablet (800 mg total) by mouth 3 (three) times daily with food as needed 60 tablet 4   levothyroxine (SYNTHROID) 88 MCG tablet Take 1 tablet (88 mcg total) by mouth every morning on an empty stomach 90 tablet 1   metoprolol succinate (TOPROL XL) 25 MG 24 hr tablet Take 1 tablet  by mouth daily. 90 tablet 3   Multiple Vitamin (MULTIVITAMIN WITH MINERALS) TABS tablet Take 1 tablet by mouth in the morning.     rosuvastatin (CRESTOR) 10 MG tablet Take 1 tablet (10 mg total) by mouth daily. 30 tablet 11   sertraline (ZOLOFT) 100 MG tablet TAKE 1 TABLET BY MOUTH EVERY NIGHT AT BEDTIME 90 tablet 1   amoxicillin (AMOXIL) 500 MG capsule Take 4 capsules by mouth 1 hour prior to dental procedure. 10 capsule 0   COVID-19 mRNA vaccine 2023-2024 (COMIRNATY) syringe Inject into the muscle. 0.3 mL 0   influenza vac split quadrivalent PF (FLUARIX QUADRIVALENT) 0.5 ML injection Inject into the muscle. 0.5 mL 0    No results found for this or any previous visit (from the past 48 hour(s)). No results found.  Review of Systems  Respiratory:  Positive for apnea.  Blood pressure 95/65, pulse 75, temperature 97.9 F (36.6 C), temperature source Oral, resp. rate 16, height '6\' 2"'$  (1.88 m), weight 94.5 kg, SpO2 98 %. Physical Exam Constitutional:      Appearance: He is normal weight.  Cardiovascular:     Rate and Rhythm: Normal rate.  Pulmonary:     Effort: Pulmonary effort is normal.  Musculoskeletal:     Cervical back: Normal range of motion.  Neurological:     Mental Status: He is alert.      Assessment/Plan Patient admitted for outpatient surgical procedure under general anesthesia: Drug-induced sleep endoscopy.  Jerrell Belfast, MD 10/27/2022, 9:38 AM

## 2022-11-04 ENCOUNTER — Other Ambulatory Visit: Payer: Self-pay | Admitting: Internal Medicine

## 2022-11-04 ENCOUNTER — Other Ambulatory Visit (HOSPITAL_COMMUNITY): Payer: Self-pay

## 2022-11-04 DIAGNOSIS — F418 Other specified anxiety disorders: Secondary | ICD-10-CM

## 2022-11-04 MED ORDER — SERTRALINE HCL 100 MG PO TABS
ORAL_TABLET | Freq: Every day | ORAL | 1 refills | Status: DC
  Filled 2022-11-04: qty 90, 90d supply, fill #0
  Filled 2023-02-07: qty 90, 90d supply, fill #1

## 2022-11-10 ENCOUNTER — Ambulatory Visit: Payer: Self-pay

## 2022-11-10 ENCOUNTER — Ambulatory Visit: Payer: No Typology Code available for payment source | Admitting: Sports Medicine

## 2022-11-10 VITALS — BP 120/78 | Ht 74.0 in | Wt 210.0 lb

## 2022-11-10 DIAGNOSIS — M79671 Pain in right foot: Secondary | ICD-10-CM

## 2022-11-10 NOTE — Assessment & Plan Note (Addendum)
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  After symptoms exercises and stretching was reviewed with him today.  To be performed once a day and then gradually increase to twice daily as tolerated.  He may use topical Voltaren gel at the site of pain.  I also made for him sport insoles with metatarsal and scaphoid pads along with bilateral heel lifts.  Recommend he perform these exercises for the next 4 to 6 weeks.  He can follow-up if he does not have any improvement of his pain.  At that time we can consider nitroglycerin patches versus formal physical therapy and repeat ultrasound.  Would recommend light exercise, walking using pain as guide.

## 2022-11-10 NOTE — Progress Notes (Unsigned)
Established Patient Office Visit  Subjective   Patient ID: Rickey Cochran, male    DOB: 08/11/1961  Age: 61 y.o. MRN: [id number]  Foot pain.  Rickey Cochran presents today with chief complaint of right foot pain located in the back of his ankle.  Rickey Cochran states this began about 2 months ago but has not been improving.  His pain is worse in the morning and after prolonged walking however it does seem to get a little better initially with walking.  Rickey Cochran has tried stretching and ibuprofen with minimal relief.  Rickey Cochran does enjoy hiking and has plans for that in the near future but this pain has been limiting him.  Rickey Cochran has also had some bilateral foot pain near his baby toes.  Rickey Cochran has tried multiple different shoes especially those with a wide toe box which has seemed to relieve some of his discomfort.  Rickey Cochran denies any injury or trauma that Rickey Cochran can recall.   ROS as listed above in HPI    Objective:     BP 120/78   Ht '6\' 2"'$  (1.88 m)   Wt 210 lb (95.3 kg)   BMI 26.96 kg/m   Physical Exam Vitals reviewed.  Constitutional:      General: Rickey Cochran is not in acute distress.    Appearance: Normal appearance. Rickey Cochran is not ill-appearing, toxic-appearing or diaphoretic.  Pulmonary:     Effort: Pulmonary effort is normal.  Neurological:     Mental Status: Rickey Cochran is alert.   Right foot: Bunionette deformity, Haglund's deformity of the posterior heel, and some hammering of toes 2 through 4.  Preserved longitudinal arch with weightbearing.  Tenderness to palpation of the posterior heel.  Full range of motion with plantarflexion, dorsiflexion, inversion and eversion.  Strength 5/5 at the ankle.  No tenderness to palpation of the base of the fifth.  Plantarflexion with squeeze of the gastroc. Normal gait. Collapse of his longitudinal arch.  Left foot: Bunionette deformity, Hammering of toes 2 through 4.  Preserved longitudinal arch with weightbearing.  No tenderness to palpation of the posterior heel.  Full range of motion with  plantarflexion, dorsiflexion, inversion and eversion.  Strength 5/5 at the ankle.  No tenderness to palpation of the base of the fifth. Collapse of his longitudinal arch.  Limited ultrasound: Right Achilles Achilles tendon was visualized in both short and long axis.  No hypoechoic changes seen. All tendon fibers appear to be intact. Some thickening of the tendon, 0.62cm with no neovessels.  There is some hypoechoic fluid at the site of the retrocalcaneal bursa. 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  After symptoms exercises and stretching was reviewed with him today.  To be performed once a day and then gradually increase to twice daily as tolerated.  Rickey Cochran may use topical Voltaren gel at the site of pain.  I also made for him sport insoles with metatarsal and scaphoid pads along with bilateral heel lifts.  Recommend Rickey Cochran perform these exercises for the next 4 to 6 weeks.  Rickey Cochran can follow-up if Rickey Cochran does not have any improvement of his pain.  At that time we can consider nitroglycerin patches versus formal physical therapy and repeat ultrasound.  Would recommend light exercise, walking using pain as guide.  Relevant Orders   [place] LIMITED JOINT SPACE STRUCTURES LOW RIGHT    Return in about 4 weeks (around 12/08/2022), or sooner if symptoms worsen or fail to improve.    Elmore Guise, DO  Addendum:  I was the preceptor for this visit and available for immediate consultation.  Karlton Lemon MD Kirt Boys

## 2022-11-10 NOTE — Patient Instructions (Signed)
For your Achilles tendinopathy we will trial heel lift, topical Voltaren and exercises.  Begin with the exercises daily as tolerated then you may slowly progress to twice a day. You may switch the insoles that we made you today to your different walking shoes.  We will follow-up with you when 3 to 4 weeks. Will get you back to hiking the [place] trail.

## 2022-11-11 ENCOUNTER — Encounter: Payer: Self-pay | Admitting: Internal Medicine

## 2022-11-11 ENCOUNTER — Ambulatory Visit (INDEPENDENT_AMBULATORY_CARE_PROVIDER_SITE_OTHER): Payer: No Typology Code available for payment source | Admitting: Internal Medicine

## 2022-11-11 VITALS — BP 108/76 | HR 55 | Temp 98.7°F | Resp 16 | Ht 74.0 in | Wt 215.4 lb

## 2022-11-11 DIAGNOSIS — R001 Bradycardia, unspecified: Secondary | ICD-10-CM | POA: Diagnosis not present

## 2022-11-11 DIAGNOSIS — E890 Postprocedural hypothyroidism: Secondary | ICD-10-CM

## 2022-11-11 LAB — BASIC METABOLIC PANEL
BUN: 13 mg/dL (ref 6–23)
CO2: 26 mEq/L (ref 19–32)
Calcium: 9.3 mg/dL (ref 8.4–10.5)
Chloride: 106 mEq/L (ref 96–112)
Creatinine, Ser: 0.75 mg/dL (ref 0.40–1.50)
GFR: 97.12 mL/min (ref >60.00)
Glucose, Bld: 84 mg/dL (ref 70–99)
Potassium: 4.2 mEq/L (ref 3.5–5.1)
Sodium: 142 mEq/L (ref 135–145)

## 2022-11-11 LAB — CBC WITH DIFFERENTIAL/PLATELET
Basophils Absolute: 0 10*3/uL (ref 0.0–0.1)
Basophils Relative: 0.5 % (ref 0.0–3.0)
Eosinophils Absolute: 0.1 10*3/uL (ref 0.0–0.7)
Eosinophils Relative: 1.9 % (ref 0.0–5.0)
HCT: 47.2 % (ref 39.0–52.0)
Hemoglobin: 16.1 g/dL (ref 13.0–17.0)
Lymphocytes Relative: 34.5 % (ref 12.0–46.0)
Lymphs Abs: 2.5 10*3/uL (ref 0.7–4.0)
MCHC: 34.1 g/dL (ref 30.0–36.0)
MCV: 93.2 fl (ref 78.0–100.0)
Monocytes Absolute: 0.6 10*3/uL (ref 0.1–1.0)
Monocytes Relative: 7.9 % (ref 3.0–12.0)
Neutro Abs: 4 10*3/uL (ref 1.4–7.7)
Neutrophils Relative %: 55.2 % (ref 43.0–77.0)
Platelets: 223 10*3/uL (ref 150.0–400.0)
RBC: 5.06 Mil/uL (ref 4.22–5.81)
RDW: 13 % (ref 11.5–15.5)
WBC: 7.2 10*3/uL (ref 4.0–10.5)

## 2022-11-11 LAB — TSH: TSH: 1.57 u[IU]/mL (ref 0.35–5.50)

## 2022-11-11 NOTE — Patient Instructions (Signed)
Bradycardia, Adult Bradycardia is a slower-than-normal heartbeat. A normal resting heart rate for an adult ranges from 60 to 100 beats per minute. With bradycardia, the resting heart rate is less than 60 beats per minute. Bradycardia can prevent enough oxygen from reaching certain areas of your body when you are active. It can be serious if it keeps enough oxygen from reaching your brain and other parts of your body. Bradycardia is not a problem for everyone. For some healthy adults, a slow resting heart rate is normal. What are the causes? This condition may be caused by: A problem with the heart, including: A problem with the heart's electrical system, such as a heart block. With a heart block, electrical signals between the chambers of the heart are partially or completely blocked, so they are not able to work as they should. A problem with the heart's natural pacemaker (sinus node). Heart disease. A heart attack. Heart damage. Lyme disease. A heart infection. A heart condition that is present at birth (congenital heart defect). Certain medicines that treat heart conditions. Certain conditions, such as hypothyroidism and obstructive sleep apnea. Problems with the balance of chemicals and other substances, like potassium, in the blood. Trauma. Radiation therapy. What increases the risk? You are more likely to develop this condition if you: Are age 65 or older. Have high blood pressure (hypertension), high cholesterol (hyperlipidemia), or diabetes. Drink heavily, use tobacco or nicotine products, or use drugs. What are the signs or symptoms? Symptoms of this condition include: Light-headedness. Feeling faint or fainting. Fatigue and weakness. Trouble with activity or exercise. Shortness of breath. Chest pain (angina). Drowsiness. Confusion. Dizziness. How is this diagnosed? This condition may be diagnosed based on: Your symptoms. Your medical history. A physical exam. During  the exam, your health care provider will listen to your heartbeat and check your pulse. To confirm the diagnosis, your health care provider may order tests, such as: Blood tests. An electrocardiogram (ECG). This test records the heart's electrical activity. The test can show how fast your heart is beating and whether the heartbeat is steady. A test in which you wear a portable device (event recorder or Holter monitor) to record your heart's electrical activity while you go about your day. An exercise test. How is this treated? Treatment for this condition depends on the cause of the condition and how severe your symptoms are. Treatment may involve: Treatment of the underlying condition. Changing your medicines or how much medicine you take. Having a small, battery-operated device called a pacemaker implanted under the skin. When bradycardia occurs, this device can be used to increase your heart rate and help your heart beat in a regular rhythm. Follow these instructions at home: Lifestyle Manage any health conditions that contribute to bradycardia as told by your health care provider. Follow a heart-healthy diet. A nutrition specialist (dietitian) can help educate you about healthy food options and changes. Follow an exercise program that is approved by your health care provider. Maintain a healthy weight. Try to reduce or manage your stress, such as with yoga or meditation. If you need help reducing stress, ask your health care provider. Do not use any products that contain nicotine or tobacco. These products include cigarettes, chewing tobacco, and vaping devices, such as e-cigarettes. If you need help quitting, ask your health care provider. Do not use illegal drugs. Alcohol use If you drink alcohol: Limit how much you have to: 0-1 drink a day for women who are not pregnant. 0-2 drinks a day   for men. Know how much alcohol is in a drink. In the [place] one drink equals one 12 oz bottle of  beer (355 mL), one 5 oz glass of wine (148 mL), or one 1 oz glass of hard liquor (44 mL). General instructions Take over-the-counter and prescription medicines only as told by your health care provider. Keep all follow-up visits. This is important. How is this prevented? In some cases, bradycardia may be prevented by: Treating underlying medical problems. Stopping behaviors or medicines that can trigger the condition. Contact a health care provider if: You feel light-headed or dizzy. You almost faint. You feel weak or are easily fatigued during physical activity. You experience confusion or have memory problems. Get help right away if: You faint. You have chest pains or an irregular heartbeat (palpitations). You have trouble breathing. These symptoms may represent a serious problem that is an emergency. Do not wait to see if the symptoms will go away. Get medical help right away. Call your local emergency services (911 in the [place] Do not drive yourself to the hospital. Summary Bradycardia is a slower-than-normal heartbeat. With bradycardia, the resting heart rate is less than 60 beats per minute. Treatment for this condition depends on the cause. Manage any health conditions that contribute to bradycardia as told by your health care provider. Do not use any products that contain nicotine or tobacco. These products include cigarettes, chewing tobacco, and vaping devices, such as e-cigarettes. Keep all follow-up visits. This is important. This information is not intended to replace advice given to you by your health care provider. Make sure you discuss any questions you have with your health care provider. Document Revised: 03/01/2021 Document Reviewed: 03/01/2021 Elsevier Patient Education  2023 Elsevier Inc.  

## 2022-11-11 NOTE — Progress Notes (Signed)
Subjective:  Patient ID: Rickey Cochran, male    DOB: 1961-01-11  Age: 61 y.o. MRN: [id number]  CC: Hypothyroidism   HPI [name] presents for f/up -  He is active and denies chest pain, shortness of breath, diaphoresis, dizziness, lightheadedness, syncopal symptoms, or palpitations.  Outpatient Medications Prior to Visit  Medication Sig Dispense Refill   acetaminophen (TYLENOL) 500 MG tablet Take 1,000 mg by mouth every 6 (six) hours as needed (pain).     alfuzosin (UROXATRAL) 10 MG 24 hr tablet TAKE 1 TABLET BY MOUTH ONCE DAILY WITH BREAKFAST 90 tablet 1   amoxicillin (AMOXIL) 500 MG capsule Take 4 capsules by mouth 1 hour prior to dental procedure. 10 capsule 0   ibuprofen (ADVIL) 800 MG tablet Take 1 tablet (800 mg total) by mouth 3 (three) times daily with food as needed 60 tablet 4   levothyroxine (SYNTHROID) 88 MCG tablet Take 1 tablet (88 mcg total) by mouth every morning on an empty stomach 90 tablet 1   metoprolol succinate (TOPROL XL) 25 MG 24 hr tablet Take 1 tablet  by mouth daily. 90 tablet 3   Multiple Vitamin (MULTIVITAMIN WITH MINERALS) TABS tablet Take 1 tablet by mouth in the morning.     rosuvastatin (CRESTOR) 10 MG tablet Take 1 tablet (10 mg total) by mouth daily. 30 tablet 11   sertraline (ZOLOFT) 100 MG tablet TAKE 1 TABLET BY MOUTH EVERY NIGHT AT BEDTIME 90 tablet 1   COVID-19 mRNA vaccine 2023-2024 (COMIRNATY) syringe Inject into the muscle. 0.3 mL 0   influenza vac split quadrivalent PF (FLUARIX QUADRIVALENT) 0.5 ML injection Inject into the muscle. 0.5 mL 0   No facility-administered medications prior to visit.    ROS Review of Systems  Constitutional: Negative.  Negative for fatigue.  HENT: Negative.    Respiratory: Negative.  Negative for cough, chest tightness, shortness of breath and wheezing.   Cardiovascular:  Negative for chest pain, palpitations and leg swelling.  Gastrointestinal: Negative.  Negative for abdominal pain, diarrhea,  nausea and vomiting.  Endocrine: Negative for cold intolerance and heat intolerance.  Genitourinary: Negative.   Musculoskeletal: Negative.   Skin: Negative.  Negative for color change.  Neurological:  Negative for dizziness, weakness and light-headedness.  Hematological:  Negative for adenopathy. Does not bruise/bleed easily.  Psychiatric/Behavioral: Negative.      Objective:  BP 108/76 (BP Location: Left Arm, Patient Position: Sitting, Cuff Size: Normal)   Pulse (!) 55   Temp 98.7 F (37.1 C) (Oral)   Resp 16   Ht '6\' 2"'$  (1.88 m)   Wt 215 lb 6.4 oz (97.7 kg)   SpO2 97%   BMI 27.66 kg/m   BP Readings from Last 3 Encounters:  11/11/22 108/76  11/10/22 120/78  10/27/22 105/65    Wt Readings from Last 3 Encounters:  11/11/22 215 lb 6.4 oz (97.7 kg)  11/10/22 210 lb (95.3 kg)  10/27/22 208 lb 5.4 oz (94.5 kg)    Physical Exam Vitals reviewed.  HENT:     Mouth/Throat:     Mouth: Mucous membranes are moist.  Eyes:     General: No scleral icterus.    Conjunctiva/sclera: Conjunctivae normal.  Cardiovascular:     Rate and Rhythm: Regular rhythm. Bradycardia present. Occasional Extrasystoles are present.    Heart sounds: No murmur heard.    No gallop.     Comments: EKG- SB, 50 bpm PAC's No LVH or Q waves Pulmonary:     Effort: Pulmonary  effort is normal.     Breath sounds: No stridor. No wheezing, rhonchi or rales.  Abdominal:     General: Abdomen is flat.     Palpations: There is no mass.     Tenderness: There is no abdominal tenderness. There is no guarding.     Hernia: No hernia is present.  Musculoskeletal:        General: Normal range of motion.     Cervical back: Neck supple.     Right lower leg: No edema.     Left lower leg: No edema.  Lymphadenopathy:     Cervical: No cervical adenopathy.  Skin:    General: Skin is warm and dry.  Neurological:     General: No focal deficit present.     Mental Status: He is alert.  Psychiatric:        Mood and  Affect: Mood normal.        Behavior: Behavior normal.     Lab Results  Component Value Date   WBC 7.2 11/11/2022   HGB 16.1 11/11/2022   HCT 47.2 11/11/2022   PLT 223.0 11/11/2022   GLUCOSE 84 11/11/2022   CHOL 164 09/28/2022   TRIG 77 09/28/2022   HDL 53 09/28/2022   LDLDIRECT 151.0 02/17/2021   LDLCALC 96 09/28/2022   ALT 21 09/28/2022   AST 21 09/28/2022   NA 142 11/11/2022   K 4.2 11/11/2022   CL 106 11/11/2022   CREATININE 0.75 11/11/2022   BUN 13 11/11/2022   CO2 26 11/11/2022   TSH 1.57 11/11/2022   PSA 1.20 05/11/2022   HGBA1C 5.3 05/23/2019    No results found.  Assessment & Plan:   Rickey Cochran was seen today for hypothyroidism.  Diagnoses and all orders for this visit:  Bradycardia- He is asymptomatic with this.  Intervention is not needed. 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 -     TSH; Future -     TSH   I have discontinued [facility]. Rickey "Steve"'s Comirnaty and Fluarix Quadrivalent. I am also having him maintain his multivitamin with minerals, acetaminophen, ibuprofen, metoprolol succinate, alfuzosin, rosuvastatin, amoxicillin, levothyroxine, and sertraline.  No orders of the defined types were placed in this encounter.    Follow-up: Return in about 6 months (around 05/13/2023).  Rickey Calico, MD

## 2022-11-18 ENCOUNTER — Other Ambulatory Visit (HOSPITAL_COMMUNITY): Payer: Self-pay

## 2022-11-18 ENCOUNTER — Other Ambulatory Visit: Payer: Self-pay

## 2022-11-30 ENCOUNTER — Telehealth: Payer: Self-pay | Admitting: Neurology

## 2022-11-30 NOTE — Telephone Encounter (Signed)
Sent mychart message

## 2022-12-01 ENCOUNTER — Ambulatory Visit (INDEPENDENT_AMBULATORY_CARE_PROVIDER_SITE_OTHER): Payer: 59 | Admitting: Sports Medicine

## 2022-12-01 ENCOUNTER — Ambulatory Visit: Payer: Self-pay

## 2022-12-01 VITALS — BP 118/70 | Ht 74.0 in | Wt 210.0 lb

## 2022-12-01 DIAGNOSIS — M7662 Achilles tendinitis, left leg: Secondary | ICD-10-CM

## 2022-12-01 DIAGNOSIS — B070 Plantar wart: Secondary | ICD-10-CM | POA: Insufficient documentation

## 2022-12-01 DIAGNOSIS — M7661 Achilles tendinitis, right leg: Secondary | ICD-10-CM | POA: Diagnosis not present

## 2022-12-01 NOTE — Progress Notes (Signed)
Established Patient Office Visit  Subjective   Patient ID: Rickey Cochran, male    DOB: July 27, 1961  Age: 62 y.o. MRN: [id number]  Follow-up right heel pain with new complaint left ankle pain.   Rickey Cochran presents today for follow-up of right Achilles tendinopathy about 4 weeks ago.  At his last visit he was made some sport insoles with metatarsal pad and a heel lift.  He was given Achilles eccentric exercises to perform.  He reports he has been diligent with his exercises and wearing his insoles.  However he feels that his foot is slipping around more in his shoe with the insoles given today.  He has had some improvement in his right heel pain but is still has quite the amount of discomfort to palpation of his heel.  He has been walking a couple miles however he gets some discomfort at the end of his walks in the next morning.  He has been using the Voltaren gel but is unable to tell if it is giving him much relief. Since beginning his rehabilitation program his left ankle and heel started to bother him after walking.  He denies any injuries to the area.  He also again reports today he is having some slight discomfort around the bunion of his fifth toe bilaterally.   ROS as listed above in HPI    Objective:     BP 118/70   Ht '6\' 2"'$  (1.88 m)   Wt 210 lb (95.3 kg)   BMI 26.96 kg/m   Physical Exam Vitals reviewed.  Constitutional:      General: He is not in acute distress.    Appearance: He is normal weight. He is not ill-appearing, toxic-appearing or diaphoretic.  Pulmonary:     Effort: Pulmonary effort is normal.  Neurological:     Mental Status: He is alert.   Right foot: Haglund's deformity of the posterior heel, bunion deformity of the lateral foot, well-preserved longitudinal arch with weightbearing.  Tenderness to palpation at Haglund's deformity.  No tenderness to palpation along the Achilles tendon or origin of the plantar fascia.  Good range of motion with plantarflexion,  dorsiflexion.  Normal gait. Left foot: No obvious deformity or asymmetry.  Bunion deformity of the lateral foot, well-preserved longitudinal arch with weightbearing.  Tenderness to palpation of the Achilles tendon.  Good range of motion plantarflexion, dorsiflexion.  Normal gait.  Limited ultrasound, bilateral Achilles tendon Right Achilles, visualized, no abnormalities within the tendon fibers or hypoechoic changes.  He does have hyperechoic changewith some hypoechoic fluid surrounding the area.  Thickened tendon measuring 0.69 cm, no neovessels seen Left Achilles, visualized, no abnormalities within the tendon fibers or hypoechoic changes.  Normal tendon measuring 0.42 cm, no neovessels seen. Impression: Right Achilles, heel, Haglund's deformity with some surrounding swelling, thickened tendon, no tear seen today.  Left Achilles visualized with no abnormalities or tear   Assessment & Plan:   Problem List Items Addressed This Visit       Musculoskeletal and Integument   Achilles tendonitis, bilateral - Primary    I suspect majority of the pain in his right foot is coming from some irritation to his Haglund's deformity.  I recommend he try to wear open back shoes as often as possible.  When wearing his tennis shoes he can use a heel cushion to pad the above area to take some pressure off the Haglund's deformity.  Continue with Voltaren gel topical as needed.  Patient continue with eccentric exercises as  well For his left Achilles pain continue with eccentric exercises. For the discomfort he is having on his bunions I recommend a bunion pad.  Handout was given to him today to show what these look like.  We have also heel locked his tennis shoes to help abate some of the feeling of shifting in his shoe.  We will follow-up with him in 6 weeks.      Relevant Orders   [place] LIMITED JOINT SPACE STRUCTURES LOW BILAT   Plantar wart    Patient may continue with over-the-counter remedies.  Referral for  dermatology was placed he is unsure this time if he would like to seek that route however the referral will be ready for him to decide.       Return in about 6 weeks (around 01/12/2023).    Elmore Guise, DO  Addendum:  Patient seen in the office by fellow.  Her history, exam, plan of care were precepted with me.  Karlton Lemon MD Kirt Boys

## 2022-12-01 NOTE — Assessment & Plan Note (Signed)
Patient may continue with over-the-counter remedies.  Referral for dermatology was placed he is unsure this time if he would like to seek that route however the referral will be ready for him to decide.

## 2022-12-01 NOTE — Assessment & Plan Note (Signed)
I suspect majority of the pain in his right foot is coming from some irritation to his Haglund's deformity.  I recommend he try to wear open back shoes as often as possible.  When wearing his tennis shoes he can use a heel cushion to pad the above area to take some pressure off the Haglund's deformity.  Continue with Voltaren gel topical as needed.  Patient continue with eccentric exercises as well For his left Achilles pain continue with eccentric exercises. For the discomfort he is having on his bunions I recommend a bunion pad.  Handout was given to him today to show what these look like.  We have also heel locked his tennis shoes to help abate some of the feeling of shifting in his shoe.  We will follow-up with him in 6 weeks.

## 2022-12-06 ENCOUNTER — Other Ambulatory Visit (HOSPITAL_COMMUNITY): Payer: Self-pay

## 2022-12-06 ENCOUNTER — Other Ambulatory Visit: Payer: Self-pay | Admitting: Internal Medicine

## 2022-12-06 DIAGNOSIS — N401 Enlarged prostate with lower urinary tract symptoms: Secondary | ICD-10-CM

## 2022-12-06 MED ORDER — ALFUZOSIN HCL ER 10 MG PO TB24
ORAL_TABLET | Freq: Every day | ORAL | 1 refills | Status: DC
  Filled 2022-12-06: qty 90, 90d supply, fill #0
  Filled 2023-03-15: qty 90, 90d supply, fill #1

## 2022-12-09 ENCOUNTER — Other Ambulatory Visit (HOSPITAL_COMMUNITY): Payer: Self-pay

## 2022-12-09 NOTE — Telephone Encounter (Signed)
Updated [facility] Josem Kaufmann req ref # Sharmaine on 12/09/22   Patient is scheduled at [facility] for 01/04/23 at 8 pm.

## 2022-12-30 ENCOUNTER — Other Ambulatory Visit: Payer: Self-pay | Admitting: Neurological Surgery

## 2023-01-04 ENCOUNTER — Ambulatory Visit (INDEPENDENT_AMBULATORY_CARE_PROVIDER_SITE_OTHER): Payer: 59 | Admitting: Neurology

## 2023-01-04 DIAGNOSIS — R9431 Abnormal electrocardiogram [ECG] [EKG]: Secondary | ICD-10-CM

## 2023-01-04 DIAGNOSIS — G4720 Circadian rhythm sleep disorder, unspecified type: Secondary | ICD-10-CM

## 2023-01-04 DIAGNOSIS — Z789 Other specified health status: Secondary | ICD-10-CM

## 2023-01-04 DIAGNOSIS — G4733 Obstructive sleep apnea (adult) (pediatric): Secondary | ICD-10-CM | POA: Diagnosis not present

## 2023-01-04 DIAGNOSIS — I480 Paroxysmal atrial fibrillation: Secondary | ICD-10-CM

## 2023-01-04 DIAGNOSIS — G4761 Periodic limb movement disorder: Secondary | ICD-10-CM

## 2023-01-10 ENCOUNTER — Other Ambulatory Visit: Payer: Self-pay | Admitting: Orthopedic Surgery

## 2023-01-10 ENCOUNTER — Other Ambulatory Visit (HOSPITAL_COMMUNITY): Payer: Self-pay

## 2023-01-10 MED ORDER — AMOXICILLIN 500 MG PO CAPS
ORAL_CAPSULE | ORAL | 0 refills | Status: DC
  Filled 2023-01-10: qty 10, 2d supply, fill #0

## 2023-01-11 NOTE — Procedures (Signed)
Physician Interpretation:     [facility] POLYSOMNOGRAPHY  INTERPRETATION REPORT   STUDY DATE:  01/04/2023     PATIENT NAME:  Rickey Cochran         DATE OF BIRTH:  1961/05/29  PATIENT ID:  [id number]    TYPE OF STUDY:  PSG  READING PHYSICIAN: [name], MD, PhD   SCORING TECHNICIAN: Gaylyn Cheers, RPSGT  Referred by: Janith Lima, MD   History and Indication for Testing: 62 year old male with a history of sinus bradycardia, PACs, osteoarthritis,?status post right hip replacement, status post left knee replacement,?thyroid cancer?with status post surgery, hypothyroidism, hypertension, depression, anxiety, BPH, recent rib fractures in April [id number] with complication of hemothorax, overweight state, and paroxysmal A. fib,?who presents for reevaluation of his obstructive sleep apnea. He has had the quality tolerating AutoPap therapy and is interested in pursuing treatment with a hypoglossal nerve stimulator.  His home sleep test on 06/10/2021 indicated severe obstructive sleep apnea with a total AHI of 33.2/hour and O2 nadir of 84%, with variable snoring detected.   Height: 74 in Weight: 212 lb (BMI 27) Neck Size: 0 in   MEDICATIONS: Acetaminophen, Uroxatral, Advil, Synthroid, Toprol XL, Multivitamins, Crestor, Zoloft  TECHNICAL DESCRIPTION: A registered sleep technologist was in attendance for the duration of the recording.  Data collection, scoring, video monitoring, and reporting were performed in compliance with the AASM Manual for the Scoring of Sleep and Associated Events; (Hypopnea is scored based on the criteria listed in Section VIII D. 1b in the AASM Manual V2.6 using a 4% oxygen desaturation rule or Hypopnea is scored based on the criteria listed in Section VIII D. 1a in the AASM Manual V2.6 using 3% oxygen desaturation and /or arousal rule).   SLEEP CONTINUITY AND SLEEP ARCHITECTURE:  Lights-out was at 20:44: and lights-on at  04:44:, with a  total recording time of 8 hours, 0 min. Total sleep time ( TST) was 405.5 minutes with a decreased sleep efficiency at 84.5%.   BODY POSITION:  TST was divided  between the following sleep positions: 10.9% supine;  89.1% lateral;  0% prone. Duration of total sleep and percent of total sleep in their respective position is as follows: supine 44 minutes (11%), non-supine 362 minutes (89%); right 128 minutes (32%), left 233 minutes (57%), and prone 00 minutes (0%).  Total supine REM sleep time was 00 minutes (0% of total REM sleep).  Sleep latency was normal at 10.0 minutes.  REM sleep latency was normal at 94.0 minutes. Of the total sleep time, the percentage of stage N1 sleep was 9.0%, which is increased, stage N2 sleep was 59%, which is mildly increased, stage N3 sleep was absent, and REM sleep was 32.1%, which is increased. Wake after sleep onset (WASO) time accounted for 64.5 minutes, with mild to moderate sleep fragmentation noted.   RESPIRATORY MONITORING:   Based on CMS criteria (using a 4% oxygen desaturation rule for scoring hypopneas), there were 81 apneas (72 obstructive; 3 central; 6 mixed), and 39 hypopneas.  Apnea index was 12.0. Hypopnea index was 5.8. The apnea-hypopnea index was 17.8 overall (81.8 supine, 3 non-supine; 2.8 REM, 0.0 supine REM).  There were 0 respiratory effort-related arousals (RERAs).  The RERA index was 0 events/h. Total respiratory disturbance index (RDI) was 17.8 events/h. RDI results showed: supine RDI  81.8 /h; non-supine RDI 10.0 /h; REM RDI 2.8 /h, supine REM RDI 0.0 /h.   Based on AASM criteria (using a 3% oxygen desaturation and /  or arousal rule for scoring hypopneas), there were 81 apneas (72 obstructive; 3 central; 6 mixed), and 66 hypopneas. Apnea index was 12.0. Hypopnea index was 9.8. The apnea-hypopnea index was 21.8/hour overall (81.8 supine, 6 non-supine; 5.5 REM, 0.0 supine REM).  There were 0 respiratory effort-related arousals (RERAs).  The RERA index  was 0 events/h. Total respiratory disturbance index (RDI) was 21.8 events/h. RDI results showed: supine RDI  81.8 /h; non-supine RDI 14.4 /h; REM RDI 5.5 /h, supine REM RDI 0.0 /h.   OXIMETRY: Oxyhemoglobin Saturation Nadir during sleep was at  66%) from a mean of 93%.  Of the Total sleep time (TST)   hypoxemia (=<88%) was present for  15.0 minutes, or 3.7% of total sleep time.   LIMB MOVEMENTS: There were 309 periodic limb movements of sleep (45.7/hr), of which 0 (0.0/hr) were associated with an arousal.  AROUSAL: There were 89 arousals in total, for an arousal index of 13 arousals/hour.  Of these, 67 were identified as respiratory-related arousals (10 /h), 0 were PLM-related arousals (0 /h), and 39 were non-specific arousals (6 /h). There were 0 occurrences of Cheyne Stokes breathing.  Snoring was classified as .  EEG:  Review of the EEG showed no abnormal electrical discharges and symmetrical  bihemispheric findings.    EKG: The EKG revealed normal sinus rhythm (NSR) with occasional to frequent PACs and bradycardia noted at times. The average heart rate during sleep was 59 bpm.    AUDIO/VIDEO REVIEW: The audio and video review did not show any abnormal or unusual behaviors, movements, phonations or vocalizations. The patient took 2 restroom breaks.  Snoring was in the mild to moderate range.  POST-STUDY QUESTIONNAIRE: Post study, the patient indicated, that sleep was the same as usual.   IMPRESSION:  1. Obstructive Sleep Apnea (OSA) 2. 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 Dysfunctions associated with sleep stages or arousal from sleep 4. Non-specific abnormal electrocardiogram (EKG)  RECOMMENDATIONS:  1. This study demonstrates moderate to severe obstructive sleep apnea, with a total AHI of 21.8/hour, REM AHI of 5.5/hour, supine AHI of 81.8/hour and O2 nadir of 66%.  There was no supine REM sleep achieved during the study. The patient has had difficulty tolerating AutoPap  therapy.  He is interested in pursuing treatment with a hypoglossal nerve stimulator.  This may be a viable option for him if he is considered a good surgical candidate.  Further evaluation and workup through ENT is recommended. Concomitant weight loss is recommended (where clinically appropriate). Please note that untreated obstructive sleep apnea may carry additional perioperative morbidity. Patients with significant obstructive sleep apnea should receive perioperative PAP therapy and the surgeons and particularly the anesthesiologist should be informed of the diagnosis and the severity of the sleep disordered breathing. 2. This study shows sleep fragmentation and abnormal sleep stage percentages; these are nonspecific findings and per se do not signify an intrinsic sleep disorder or a cause for the patient's sleep-related symptoms. Causes include (but are not limited to) the first night effect of the sleep study, circadian rhythm disturbances, medication effect or an underlying mood disorder or medical problem.  3. The patient should be cautioned not to drive, work at heights, or operate dangerous or heavy equipment when tired or sleepy. Review and reiteration of good sleep hygiene measures should be pursued with any patient. 4. Moderate PLMs (periodic limb movements of sleep) were noted during this study with without significant arousals; clinical correlation is recommended. Medication effect from the antidepressant medication  should be considered. PLMs may improve with OSA treatment.  5. The study showed occasional to frequent PACs on single lead EKG and bradycardia; clinical correlation is recommended. Of note, the patient is well-known to cardiology.  6. The patient will be seen in follow-up in the sleep clinic at [facility] for discussion of the test results, symptom and treatment compliance review, further management strategies, etc. The patient and his referring provider will be notified of the test  results.   I certify that I have reviewed the entire raw data recording prior to the issuance of this report in accordance with the Standards of Accreditation of the American Academy of Sleep Medicine (AASM).  [name], MD, PhD Medical Director, [facility]) [place], ABPN (Neurology and Sleep)               Technical Report:   General Information  Name: Diaz, Matusik BMI: [id number] Physician: [name], MD  ID: [id number] Height: 74.0 in Technician: Gaylyn Cheers, RPSGT  Sex: Male Weight: 212.0 lb Record: [id number]  Age: 39 [November 23, 1960] Date: 01/04/2023    Medical & Medication History    Rickey Cochran is a 62 year old right-handed gentleman with an underlying medical history of sinus bradycardia, PACs, osteoarthritis, status post right hip replacement, status post left knee replacement, thyroid cancer with status post surgery, hypothyroidism, hypertension, depression, anxiety, BPH, recent rib fractures in April [id number] with complication of hemothorax, overweight state, paroxysmal A. fib, who presents for follow-up consultation of his obstructive sleep apnea on AutoPap therapy with difficulty tolerating treatment and to discuss inspire candidacy.  Acetaminophen, Uroxatral, Advil, Synthroid, Toprol XL, Multivitamins, Crestor, Zoloft   Sleep Disorder      Comments   Patient arrived for a diagnostic polysomnogram. Patient is interested in getting Inspire implant, as he has been unable to tolerate his Auto PAP device. Procedure explained and all questions answered. Standard paste setup without complications. Patient slept supine, left, and right. Mild to moderate snoring heard. Respiratory events observed, worse while supine. Patient has a known cardiac history with PAC's and PVC's. Cardiac arrhythmias observed. After two hours sleep time, AHI = 15. PLMS observed. Two restroom visits.     Lights out: 08:44:30 PM Lights on: 04:44:22 AM    Time Total Supine Side Prone Upright  Recording (TRT) 8h 0.33m1h 22.039mh 38.56m32m 0.56m 61m0.56m  356mep (TST) 6h 45.1m 0h27m.56m 6h 33mm 0h 043m 0h 0.756m  Late656m N1 N2 N3 REM Onset Per. Slp. Eff.  Actual 0h 0.56m 0h 2.56m756m 0.56m 18m34.56m 56m10.56m 051m2.56m 8437m%   Stg12mr Wake N1 N2 N3 REM  Total 74.5 36.5 239.0 0.0 130.0  Supine 38.0 19.0 25.0 0.0 0.0  Side 36.5 17.5 214.0 0.0 130.0  Prone 0.0 0.0 0.0 0.0 0.0  Upright 0.0 0.0 0.0 0.0 0.0   Stg % Wake N1 N2 N3 REM  Total 15.5 9.0 58.9 0.0 32.1  Supine 7.9 4.7 6.2 0.0 0.0  Side 7.6 4.3 52.8 0.0 32.1  Prone 0.0 0.0 0.0 0.0 0.0  Upright 0.0 0.0 0.0 0.0 0.0     Apnea Summary Sub Supine Side Prone Upright  Total 81 Total 81 56 25 0 0    REM 1 0 1 0 0    NREM 80 56 24 0 0  Obs 72 REM 0 0 0 0 0    NREM 72 53 19 0 0  Mix 6 REM 1 0 1 0 0  NREM '5 2 3 '$ 0 0  Cen 3 REM 0 0 0 0 0    NREM '3 1 2 '$ 0 0   Rera Summary Sub Supine Side Prone Upright  Total 0 Total 0 0 0 0 0    REM 0 0 0 0 0    NREM 0 0 0 0 0   Hypopnea Summary Sub Supine Side Prone Upright  Total 66 Total 66 4 62 0 0    REM 11 0 11 0 0    NREM 55 4 51 0 0   4% Hypopnea Summary Sub Supine Side Prone Upright  Total (4%) 39 Total 39 4 35 0 0    REM 5 0 5 0 0    NREM 34 4 30 0 0     AHI Total Obs Mix Cen  21.75 Apnea 11.99 10.65 0.89 0.44   Hypopnea 9.77 -- -- --  17.76 Hypopnea (4%) 5.77 -- -- --    Total Supine Side Prone Upright  Position AHI 21.75 81.82 14.44 0.00 0.00  REM AHI 5.54   NREM AHI 29.40   Position RDI 21.75 81.82 14.44 0.00 0.00  REM RDI 5.54   NREM RDI 29.40    4% Hypopnea Total Supine Side Prone Upright  Position AHI (4%) 17.76 81.82 9.96 0.00 0.00  REM AHI (4%) 2.77   NREM AHI (4%) 24.83   Position RDI (4%) 17.76 81.82 9.96 0.00 0.00  REM RDI (4%) 2.77   NREM RDI (4%) 24.83    Desaturation Information Threshold: 2% <100% <90% <80% <70% <60% <50% <40%  Supine 88.0 49.0 2.0 0.0 0.0 0.0 0.0  Side 218.0 45.0 1.0 0.0 0.0 0.0 0.0  Prone  0.0 0.0 0.0 0.0 0.0 0.0 0.0  Upright 0.0 0.0 0.0 0.0 0.0 0.0 0.0  Total 306.0 94.0 3.0 0.0 0.0 0.0 0.0  Index 39.8 12.2 0.4 0.0 0.0 0.0 0.0   Threshold: 3% <100% <90% <80% <70% <60% <50% <40%  Supine 81.0 49.0 2.0 0.0 0.0 0.0 0.0  Side 121.0 38.0 1.0 0.0 0.0 0.0 0.0  Prone 0.0 0.0 0.0 0.0 0.0 0.0 0.0  Upright 0.0 0.0 0.0 0.0 0.0 0.0 0.0  Total 202.0 87.0 3.0 0.0 0.0 0.0 0.0  Index 26.3 11.3 0.4 0.0 0.0 0.0 0.0   Threshold: 4% <100% <90% <80% <70% <60% <50% <40%  Supine 72.0 49.0 2.0 0.0 0.0 0.0 0.0  Side 72.0 31.0 1.0 0.0 0.0 0.0 0.0  Prone 0.0 0.0 0.0 0.0 0.0 0.0 0.0  Upright 0.0 0.0 0.0 0.0 0.0 0.0 0.0  Total 144.0 80.0 3.0 0.0 0.0 0.0 0.0  Index 18.7 10.4 0.4 0.0 0.0 0.0 0.0   Threshold: 3% <100% <90% <80% <70% <60% <50% <40%  Supine 81 49 2 0 0 0 0  Side 121 38 1 0 0 0 0  Prone 0 0 0 0 0 0 0  Upright 0 0 0 0 0 0 0  Total 202 87 3 0 0 0 0   Awakening/Arousal Information # of Awakenings 31  Wake after sleep onset 64.24m Wake after persistent sleep 60.017m Arousal Assoc. Arousals Index  Apneas 58 8.6  Hypopneas 9 1.3  Leg Movements 5 0.7  Snore 0 0.0  PTT Arousals 0 0.0  Spontaneous 39 5.8  Total 110 16.3  Leg Movement Information PLMS LMs Index  Total LMs during PLMS 309 45.7  LMs w/ Microarousals 0 0.0   LM LMs Index  w/ Microarousal 5 0.7  w/ Awakening 17 2.5  w/ Resp Event 4 0.6  Spontaneous 37 5.5  Total 45 6.7     Desaturation threshold setting: 3% Minimum desaturation setting: 10 seconds SaO2 nadir: 66% The longest event was a 46 sec obstructive Apnea with a minimum SaO2 of 75%. The lowest SaO2 was 75% associated with a 46 sec obstructive Apnea. EKG Rates EKG Avg Max Min  Awake 65 124 46  Asleep 59 108 45  EKG Events: Tachycardia

## 2023-01-12 ENCOUNTER — Ambulatory Visit: Payer: 59 | Admitting: Sports Medicine

## 2023-01-19 ENCOUNTER — Telehealth: Payer: Self-pay | Admitting: *Deleted

## 2023-01-19 NOTE — Telephone Encounter (Signed)
I called pt and relayed the 01-04-2023 sleep study results that he had done.   ----- Message from [name], MD sent at 01/17/2023  9:35 AM EST ----- Patient had a PSG on 01/04/23 for reevaluation of his OSA.  He is pursuing inspire treatment as he could not tolerate AutoPap therapy.  I saw him in August and he has since then seen Dr. Wilburn Cornelia in October, had a drug-induced sleep endoscopy in December 2023.  Please advise patient that his sleep study showed moderate to severe sleep apnea.  At this juncture, he can follow-up with Dr. Wilburn Cornelia as planned for evaluation and treatment with inspire.  I can see him in follow-up after his inspire placement and for his activation visit.  Pt stated that Dr. Wilburn Cornelia had retired end of January. He verbalized understanding of results.   He believed that Dr. Redmond Baseman would do procedure.  I will forward this to them.  He will follow up with [place] when needed.

## 2023-01-19 NOTE — Telephone Encounter (Signed)
-----   Message from [name], MD sent at 01/17/2023  9:35 AM EST ----- Patient had a PSG on 01/04/23 for reevaluation of his OSA.  He is pursuing inspire treatment as he could not tolerate AutoPap therapy.  I saw him in August and he has since then seen Dr. Wilburn Cornelia in October, had a drug-induced sleep endoscopy in December 2023.  Please advise patient that his sleep study showed moderate to severe sleep apnea.  At this juncture, he can follow-up with Dr. Wilburn Cornelia as planned for evaluation and treatment with inspire.  I can see him in follow-up after his inspire placement and for his activation visit.

## 2023-02-07 ENCOUNTER — Other Ambulatory Visit (HOSPITAL_COMMUNITY): Payer: Self-pay

## 2023-02-11 ENCOUNTER — Other Ambulatory Visit: Payer: Self-pay | Admitting: Otolaryngology

## 2023-02-21 ENCOUNTER — Other Ambulatory Visit (HOSPITAL_COMMUNITY): Payer: Self-pay

## 2023-03-01 ENCOUNTER — Other Ambulatory Visit (HOSPITAL_COMMUNITY): Payer: Self-pay

## 2023-03-01 ENCOUNTER — Other Ambulatory Visit: Payer: Self-pay | Admitting: Internal Medicine

## 2023-03-01 MED ORDER — LEVOTHYROXINE SODIUM 88 MCG PO TABS
88.0000 ug | ORAL_TABLET | Freq: Every morning | ORAL | 1 refills | Status: DC
  Filled 2023-03-01: qty 90, 90d supply, fill #0
  Filled 2023-05-30: qty 90, 90d supply, fill #1

## 2023-03-10 ENCOUNTER — Other Ambulatory Visit: Payer: Self-pay

## 2023-03-10 ENCOUNTER — Encounter (HOSPITAL_BASED_OUTPATIENT_CLINIC_OR_DEPARTMENT_OTHER): Payer: Self-pay | Admitting: Otolaryngology

## 2023-03-10 DIAGNOSIS — G4733 Obstructive sleep apnea (adult) (pediatric): Secondary | ICD-10-CM | POA: Diagnosis not present

## 2023-03-10 DIAGNOSIS — Z6828 Body mass index (BMI) 28.0-28.9, adult: Secondary | ICD-10-CM | POA: Diagnosis not present

## 2023-03-16 ENCOUNTER — Ambulatory Visit (HOSPITAL_BASED_OUTPATIENT_CLINIC_OR_DEPARTMENT_OTHER): Payer: 59 | Admitting: Anesthesiology

## 2023-03-16 ENCOUNTER — Other Ambulatory Visit: Payer: Self-pay

## 2023-03-16 ENCOUNTER — Encounter (HOSPITAL_BASED_OUTPATIENT_CLINIC_OR_DEPARTMENT_OTHER)
Admission: RE | Disposition: A | Discharge status: Home / Self Care | Payer: Self-pay | Source: Home / Self Care | Attending: Otolaryngology

## 2023-03-16 ENCOUNTER — Ambulatory Visit (HOSPITAL_COMMUNITY): Payer: 59

## 2023-03-16 ENCOUNTER — Encounter (HOSPITAL_BASED_OUTPATIENT_CLINIC_OR_DEPARTMENT_OTHER): Payer: Self-pay | Admitting: Otolaryngology

## 2023-03-16 ENCOUNTER — Ambulatory Visit (HOSPITAL_COMMUNITY)
Admission: RE | Admit: 2023-03-16 | Discharge: 2023-03-16 | Disposition: A | Discharge status: Home / Self Care | Payer: 59 | Attending: Otolaryngology | Admitting: Otolaryngology

## 2023-03-16 ENCOUNTER — Other Ambulatory Visit (HOSPITAL_COMMUNITY): Payer: Self-pay

## 2023-03-16 DIAGNOSIS — E663 Overweight: Secondary | ICD-10-CM | POA: Insufficient documentation

## 2023-03-16 DIAGNOSIS — I10 Essential (primary) hypertension: Secondary | ICD-10-CM

## 2023-03-16 DIAGNOSIS — Z0389 Encounter for observation for other suspected diseases and conditions ruled out: Secondary | ICD-10-CM | POA: Diagnosis not present

## 2023-03-16 DIAGNOSIS — E039 Hypothyroidism, unspecified: Secondary | ICD-10-CM

## 2023-03-16 DIAGNOSIS — Z87891 Personal history of nicotine dependence: Secondary | ICD-10-CM

## 2023-03-16 DIAGNOSIS — Z6827 Body mass index (BMI) 27.0-27.9, adult: Secondary | ICD-10-CM | POA: Diagnosis not present

## 2023-03-16 DIAGNOSIS — Z01818 Encounter for other preprocedural examination: Secondary | ICD-10-CM

## 2023-03-16 DIAGNOSIS — G4733 Obstructive sleep apnea (adult) (pediatric): Secondary | ICD-10-CM | POA: Insufficient documentation

## 2023-03-16 DIAGNOSIS — S2231XA Fracture of one rib, right side, initial encounter for closed fracture: Secondary | ICD-10-CM | POA: Diagnosis not present

## 2023-03-16 HISTORY — PX: IMPLANTATION OF HYPOGLOSSAL NERVE STIMULATOR: SHX6827

## 2023-03-16 SURGERY — INSERTION, HYPOGLOSSAL NERVE STIMULATOR
Anesthesia: General | Site: Chest | Laterality: Right

## 2023-03-16 MED ORDER — CELECOXIB 200 MG PO CAPS: 200.0000 mg | ORAL_CAPSULE | Freq: Once | ORAL | Status: DC

## 2023-03-16 MED ORDER — ACETAMINOPHEN 500 MG PO TABS
ORAL_TABLET | ORAL | Status: AC
  Filled 2023-03-16: qty 2

## 2023-03-16 MED ORDER — MIDAZOLAM HCL 5 MG/5ML IJ SOLN
INTRAMUSCULAR | Status: DC | PRN
  Administered 2023-03-16: 2 mg via INTRAVENOUS

## 2023-03-16 MED ORDER — OXYCODONE HCL 5 MG PO TABS: 5.0000 mg | ORAL_TABLET | Freq: Once | ORAL | Status: DC | PRN

## 2023-03-16 MED ORDER — ACETAMINOPHEN 500 MG PO TABS: 1000.0000 mg | ORAL_TABLET | Freq: Once | ORAL | Status: DC

## 2023-03-16 MED ORDER — LIDOCAINE-EPINEPHRINE 1 %-1:100000 IJ SOLN
INTRAMUSCULAR | Status: DC | PRN
  Administered 2023-03-16: 4 mL

## 2023-03-16 MED ORDER — ONDANSETRON HCL 4 MG/2ML IJ SOLN
INTRAMUSCULAR | Status: DC | PRN
  Administered 2023-03-16 (×2): 4 mg via INTRAVENOUS

## 2023-03-16 MED ORDER — ACETAMINOPHEN 160 MG/5ML PO SOLN: 325.0000 mg | ORAL | Status: DC | PRN

## 2023-03-16 MED ORDER — DEXMEDETOMIDINE HCL IN NACL 80 MCG/20ML IV SOLN
INTRAVENOUS | Status: DC | PRN
  Administered 2023-03-16: 8 ug via INTRAVENOUS

## 2023-03-16 MED ORDER — PHENYLEPHRINE HCL (PRESSORS) 10 MG/ML IV SOLN
INTRAVENOUS | Status: AC
  Filled 2023-03-16: qty 1

## 2023-03-16 MED ORDER — FENTANYL CITRATE (PF) 100 MCG/2ML IJ SOLN
INTRAMUSCULAR | Status: AC
  Filled 2023-03-16: qty 2

## 2023-03-16 MED ORDER — LIDOCAINE 2% (20 MG/ML) 5 ML SYRINGE
INTRAMUSCULAR | Status: DC | PRN
  Administered 2023-03-16: 60 mg via INTRAVENOUS

## 2023-03-16 MED ORDER — SUCCINYLCHOLINE CHLORIDE 200 MG/10ML IV SOSY
PREFILLED_SYRINGE | INTRAVENOUS | Status: DC | PRN
  Administered 2023-03-16: 100 mg via INTRAVENOUS

## 2023-03-16 MED ORDER — ONDANSETRON HCL 4 MG/2ML IJ SOLN: 4.0000 mg | Freq: Once | INTRAMUSCULAR | Status: DC | PRN

## 2023-03-16 MED ORDER — ACETAMINOPHEN 325 MG PO TABS: 325.0000 mg | ORAL_TABLET | ORAL | Status: DC | PRN

## 2023-03-16 MED ORDER — PROPOFOL 500 MG/50ML IV EMUL
INTRAVENOUS | Status: DC | PRN
  Administered 2023-03-16: 25 ug/kg/min via INTRAVENOUS

## 2023-03-16 MED ORDER — CELECOXIB 200 MG PO CAPS
ORAL_CAPSULE | ORAL | Status: AC
  Filled 2023-03-16: qty 1

## 2023-03-16 MED ORDER — ACETAMINOPHEN 500 MG PO TABS
1000.0000 mg | ORAL_TABLET | Freq: Once | ORAL | Status: AC
  Administered 2023-03-16: 1000 mg via ORAL

## 2023-03-16 MED ORDER — PROPOFOL 10 MG/ML IV BOLUS
INTRAVENOUS | Status: DC | PRN
  Administered 2023-03-16: 200 mg via INTRAVENOUS

## 2023-03-16 MED ORDER — FENTANYL CITRATE (PF) 100 MCG/2ML IJ SOLN: 25.0000 ug | INTRAMUSCULAR | Status: DC | PRN

## 2023-03-16 MED ORDER — PHENYLEPHRINE HCL-NACL 20-0.9 MG/250ML-% IV SOLN
INTRAVENOUS | Status: DC | PRN
  Administered 2023-03-16: 20 ug/min via INTRAVENOUS

## 2023-03-16 MED ORDER — MEPERIDINE HCL 25 MG/ML IJ SOLN: 6.2500 mg | INTRAMUSCULAR | Status: DC | PRN

## 2023-03-16 MED ORDER — 0.9 % SODIUM CHLORIDE (POUR BTL) OPTIME
TOPICAL | Status: DC | PRN
  Administered 2023-03-16: 200 mL

## 2023-03-16 MED ORDER — CEFAZOLIN SODIUM-DEXTROSE 2-4 GM/100ML-% IV SOLN
2.0000 g | INTRAVENOUS | Status: AC
  Administered 2023-03-16: 2 g via INTRAVENOUS

## 2023-03-16 MED ORDER — OXYCODONE HCL 5 MG/5ML PO SOLN: 5.0000 mg | Freq: Once | ORAL | Status: DC | PRN

## 2023-03-16 MED ORDER — FENTANYL CITRATE (PF) 100 MCG/2ML IJ SOLN
INTRAMUSCULAR | Status: DC | PRN
  Administered 2023-03-16 (×2): 50 ug via INTRAVENOUS

## 2023-03-16 MED ORDER — CELECOXIB 200 MG PO CAPS
200.0000 mg | ORAL_CAPSULE | Freq: Once | ORAL | Status: AC
  Administered 2023-03-16: 200 mg via ORAL

## 2023-03-16 MED ORDER — CEFAZOLIN SODIUM-DEXTROSE 2-4 GM/100ML-% IV SOLN
INTRAVENOUS | Status: AC
  Filled 2023-03-16: qty 100

## 2023-03-16 MED ORDER — HYDROCODONE-ACETAMINOPHEN 5-325 MG PO TABS
1.0000 | ORAL_TABLET | Freq: Four times a day (QID) | ORAL | 0 refills | Status: DC | PRN
  Filled 2023-03-16: qty 12, 2d supply, fill #0

## 2023-03-16 MED ORDER — IBUPROFEN 800 MG PO TABS
800.0000 mg | ORAL_TABLET | Freq: Three times a day (TID) | ORAL | 0 refills | Status: DC | PRN
  Filled 2023-03-16: qty 21, 7d supply, fill #0

## 2023-03-16 MED ORDER — MIDAZOLAM HCL 2 MG/2ML IJ SOLN
INTRAMUSCULAR | Status: AC
  Filled 2023-03-16: qty 2

## 2023-03-16 MED ORDER — DEXAMETHASONE SODIUM PHOSPHATE 4 MG/ML IJ SOLN
INTRAMUSCULAR | Status: DC | PRN
  Administered 2023-03-16: 5 mg via INTRAVENOUS

## 2023-03-16 MED ORDER — LACTATED RINGERS IV SOLN: INTRAVENOUS | Status: DC

## 2023-03-16 SURGICAL SUPPLY — 75 items
ACC NRSTM 4 TRQ WRNCH STRL (MISCELLANEOUS)
ADH SKN CLS APL DERMABOND .7 (GAUZE/BANDAGES/DRESSINGS) ×2
BLADE CLIPPER SURG (BLADE) IMPLANT
BLADE SURG 15 STRL LF DISP TIS (BLADE) ×1 IMPLANT
BLADE SURG 15 STRL SS (BLADE) ×1
CANISTER SUCT 1200ML W/VALVE (MISCELLANEOUS) ×1 IMPLANT
CORD BIPOLAR FORCEPS 12FT (ELECTRODE) ×1 IMPLANT
COVER PROBE CYLINDRICAL 5X96 (MISCELLANEOUS) ×1 IMPLANT
DERMABOND ADVANCED .7 DNX12 (GAUZE/BANDAGES/DRESSINGS) ×2 IMPLANT
DRAPE C-ARM 35X43 STRL (DRAPES) ×1 IMPLANT
DRAPE HEAD BAR (DRAPES) IMPLANT
DRAPE INCISE IOBAN 66X45 STRL (DRAPES) ×1 IMPLANT
DRAPE MICROSCOPE WILD 40.5X102 (DRAPES) ×1 IMPLANT
DRAPE UTILITY XL STRL (DRAPES) ×1 IMPLANT
DRSG TEGADERM 2-3/8X2-3/4 SM (GAUZE/BANDAGES/DRESSINGS) ×2 IMPLANT
DRSG TEGADERM 4X4.75 (GAUZE/BANDAGES/DRESSINGS) IMPLANT
ELECT COATED BLADE 2.86 ST (ELECTRODE) ×1 IMPLANT
ELECT EMG 18 NIMS (NEUROSURGERY SUPPLIES) ×1
ELECT REM PT RETURN 9FT ADLT (ELECTROSURGICAL) ×1
ELECTRODE EMG 18 NIMS (NEUROSURGERY SUPPLIES) ×1 IMPLANT
ELECTRODE REM PT RTRN 9FT ADLT (ELECTROSURGICAL) ×1 IMPLANT
FORCEPS BIPOLAR SPETZLER 8 1.0 (NEUROSURGERY SUPPLIES) ×1 IMPLANT
GAUZE 4X4 16PLY ~~LOC~~+RFID DBL (SPONGE) ×1 IMPLANT
GAUZE SPONGE 4X4 12PLY STRL (GAUZE/BANDAGES/DRESSINGS) ×1 IMPLANT
GENERATOR PULSE INSPIRE (Generator) ×1 IMPLANT
GENERATOR PULSE INSPIRE IV (Generator) ×1 IMPLANT
GLOVE BIO SURGEON STRL SZ 6.5 (GLOVE) IMPLANT
GLOVE BIO SURGEON STRL SZ7.5 (GLOVE) ×1 IMPLANT
GLOVE BIOGEL PI IND STRL 7.0 (GLOVE) IMPLANT
GLOVE BIOGEL PI IND STRL 7.5 (GLOVE) IMPLANT
GLOVE SURG SYN 7.5  E (GLOVE) ×1
GLOVE SURG SYN 7.5 E (GLOVE) ×1 IMPLANT
GLOVE SURG SYN 7.5 PF PI (GLOVE) IMPLANT
GOWN STRL REUS W/ TWL LRG LVL3 (GOWN DISPOSABLE) ×3 IMPLANT
GOWN STRL REUS W/ TWL XL LVL3 (GOWN DISPOSABLE) IMPLANT
GOWN STRL REUS W/TWL LRG LVL3 (GOWN DISPOSABLE) ×2
GOWN STRL REUS W/TWL XL LVL3 (GOWN DISPOSABLE) ×1
IV CATH 18G SAFETY (IV SOLUTION) ×1 IMPLANT
KIT NEURO ACCESSORY W/WRENCH (MISCELLANEOUS) IMPLANT
LEAD SENSING RESP INSPIRE (Lead) ×1 IMPLANT
LEAD SENSING RESP INSPIRE IV (Lead) ×1 IMPLANT
LEAD SLEEP STIM INSPIRE IV/V (Lead) ×1 IMPLANT
LEAD SLEEP STIMULATION INSPIRE (Lead) ×1 IMPLANT
LOOP VASCLR MAXI BLUE 18IN ST (MISCELLANEOUS) ×1 IMPLANT
LOOP VASCULAR MAXI 18 BLUE (MISCELLANEOUS) ×1
LOOP VASCULAR MINI 18 RED (MISCELLANEOUS) ×1
LOOPS VASCLR MAXI BLUE 18IN ST (MISCELLANEOUS) ×1 IMPLANT
MARKER SKIN DUAL TIP RULER LAB (MISCELLANEOUS) ×1 IMPLANT
NDL HYPO 25X1 1.5 SAFETY (NEEDLE) ×1 IMPLANT
NEEDLE HYPO 25X1 1.5 SAFETY (NEEDLE) ×1 IMPLANT
NS IRRIG 1000ML POUR BTL (IV SOLUTION) ×1 IMPLANT
PACK BASIN DAY SURGERY FS (CUSTOM PROCEDURE TRAY) ×1 IMPLANT
PACK ENT DAY SURGERY (CUSTOM PROCEDURE TRAY) ×1 IMPLANT
PASSER CATH 36 CODMAN DISP (NEUROSURGERY SUPPLIES) IMPLANT
PASSER CATH 38CM DISP (INSTRUMENTS) IMPLANT
PENCIL SMOKE EVACUATOR (MISCELLANEOUS) ×1 IMPLANT
PROBE NERVE STIMULATOR (NEUROSURGERY SUPPLIES) ×1 IMPLANT
REMOTE CONTROL SLEEP INSPIRE (MISCELLANEOUS) ×1 IMPLANT
SET WALTER ACTIVATION W/DRAPE (SET/KITS/TRAYS/PACK) ×1 IMPLANT
SLEEVE SCD COMPRESS KNEE MED (STOCKING) ×1 IMPLANT
SPONGE INTESTINAL PEANUT (DISPOSABLE) ×1 IMPLANT
SUT SILK 2 0 SH (SUTURE) ×1 IMPLANT
SUT SILK 3 0 REEL (SUTURE) ×1 IMPLANT
SUT SILK 3 0 SH 30 (SUTURE) ×1 IMPLANT
SUT SILK 3-0 (SUTURE) ×1
SUT SILK 3-0 RB1 30XBRD (SUTURE) ×1
SUT VIC AB 3-0 SH 27 (SUTURE) ×2
SUT VIC AB 3-0 SH 27X BRD (SUTURE) ×2 IMPLANT
SUT VIC AB 4-0 PS2 27 (SUTURE) ×2 IMPLANT
SUTURE SILK 3-0 RB1 30XBRD (SUTURE) ×1 IMPLANT
SYR 10ML LL (SYRINGE) ×1 IMPLANT
SYR BULB EAR ULCER 3OZ GRN STR (SYRINGE) ×1 IMPLANT
TOWEL GREEN STERILE FF (TOWEL DISPOSABLE) ×2 IMPLANT
VASCULAR TIE MAXI BLUE 18IN ST (MISCELLANEOUS) ×1
VASCULAR TIE MINI RED 18IN STL (MISCELLANEOUS) ×1 IMPLANT

## 2023-03-16 NOTE — Brief Op Note (Signed)
03/16/2023  2:13 PM  PATIENT:  Rickey Cochran  62 y.o. male  PRE-OPERATIVE DIAGNOSIS:  OBSTRUCTIVE SLEEP  APNEA  ADULT, PEDIATRIC  POST-OPERATIVE DIAGNOSIS:  OBSTRUCTIVE SLEEP APNEA ADULT, PEDIATRIC  PROCEDURE:  Procedure(s): IMPLANTATION OF HYPOGLOSSAL NERVE STIMULATOR (Right)  SURGEON:  Surgeon(s) and Role:    * Christia Reading, MD - Primary  PHYSICIAN ASSISTANT:   ASSISTANTS: none   ANESTHESIA:   general  EBL:  Minimal   BLOOD ADMINISTERED:none  DRAINS: none   LOCAL MEDICATIONS USED:  LIDOCAINE   SPECIMEN:  No Specimen  DISPOSITION OF SPECIMEN:  N/A  COUNTS:  YES  TOURNIQUET:  * No tourniquets in log *  DICTATION: .Note written in EPIC  PLAN OF CARE: Discharge to home after PACU  PATIENT DISPOSITION:  PACU - hemodynamically stable.   Delay start of Pharmacological VTE agent (>24hrs) due to surgical blood loss or risk of bleeding: no

## 2023-03-16 NOTE — Discharge Instructions (Signed)
  Post Anesthesia Home Care Instructions  Next tylenol dose 6:15pm, next motrin 8pm Activity: Get plenty of rest for the remainder of the day. A responsible individual must stay with you for 24 hours following the procedure.  For the next 24 hours, DO NOT: -Drive a car -Advertising copywriter -Drink alcoholic beverages -Take any medication unless instructed by your physician -Make any legal decisions or sign important papers.  Meals: Start with liquid foods such as gelatin or soup. Progress to regular foods as tolerated. Avoid greasy, spicy, heavy foods. If nausea and/or vomiting occur, drink only clear liquids until the nausea and/or vomiting subsides. Call your physician if vomiting continues.  Special Instructions/Symptoms: Your throat may feel dry or sore from the anesthesia or the breathing tube placed in your throat during surgery. If this causes discomfort, gargle with warm salt water. The discomfort should disappear within 24 hours.  If you had a scopolamine patch placed behind your ear for the management of post- operative nausea and/or vomiting:  1. The medication in the patch is effective for 72 hours, after which it should be removed.  Wrap patch in a tissue and discard in the trash. Wash hands thoroughly with soap and water. 2. You may remove the patch earlier than 72 hours if you experience unpleasant side effects which may include dry mouth, dizziness or visual disturbances. 3. Avoid touching the patch. Wash your hands with soap and water after contact with the patch.    Post Anesthesia Home Care Instructions  Activity: Get plenty of rest for the remainder of the day. A responsible individual must stay with you for 24 hours following the procedure.  For the next 24 hours, DO NOT: -Drive a car -Advertising copywriter -Drink alcoholic beverages -Take any medication unless instructed by your physician -Make any legal decisions or sign important papers.  Meals: Start with liquid  foods such as gelatin or soup. Progress to regular foods as tolerated. Avoid greasy, spicy, heavy foods. If nausea and/or vomiting occur, drink only clear liquids until the nausea and/or vomiting subsides. Call your physician if vomiting continues.  Special Instructions/Symptoms: Your throat may feel dry or sore from the anesthesia or the breathing tube placed in your throat during surgery. If this causes discomfort, gargle with warm salt water. The discomfort should disappear within 24 hours.  If you had a scopolamine patch placed behind your ear for the management of post- operative nausea and/or vomiting:  1. The medication in the patch is effective for 72 hours, after which it should be removed.  Wrap patch in a tissue and discard in the trash. Wash hands thoroughly with soap and water. 2. You may remove the patch earlier than 72 hours if you experience unpleasant side effects which may include dry mouth, dizziness or visual disturbances. 3. Avoid touching the patch. Wash your hands with soap and water after contact with the patch.

## 2023-03-16 NOTE — Anesthesia Preprocedure Evaluation (Signed)
Anesthesia Evaluation  Patient identified by MRN, date of birth, ID band Patient awake    Reviewed: Allergy & Precautions, H&P , NPO status , Patient's Chart, lab work & pertinent test results  Airway Mallampati: II  TM Distance: >3 FB Neck ROM: Full    Dental no notable dental hx.    Pulmonary sleep apnea , former smoker   Pulmonary exam normal breath sounds clear to auscultation       Cardiovascular hypertension, Normal cardiovascular exam Rhythm:Regular Rate:Normal     Neuro/Psych negative neurological ROS  negative psych ROS   GI/Hepatic negative GI ROS, Neg liver ROS,,,  Endo/Other  Hypothyroidism    Renal/GU negative Renal ROS  negative genitourinary   Musculoskeletal negative musculoskeletal ROS (+)    Abdominal   Peds negative pediatric ROS (+)  Hematology   Anesthesia Other Findings   Reproductive/Obstetrics negative OB ROS                             Anesthesia Physical Anesthesia Plan  ASA: 2  Anesthesia Plan: General   Post-op Pain Management: Tylenol PO (pre-op)* and Celebrex PO (pre-op)*   Induction: Intravenous  PONV Risk Score and Plan: 2 and Ondansetron, Dexamethasone and Treatment may vary due to age or medical condition  Airway Management Planned: Oral ETT and LMA  Additional Equipment: None  Intra-op Plan:   Post-operative Plan: Extubation in OR  Informed Consent: I have reviewed the patients History and Physical, chart, labs and discussed the procedure including the risks, benefits and alternatives for the proposed anesthesia with the patient or authorized representative who has indicated his/her understanding and acceptance.     Dental advisory given  Plan Discussed with: CRNA and Anesthesiologist  Anesthesia Plan Comments: (  )       Anesthesia Quick Evaluation

## 2023-03-16 NOTE — Transfer of Care (Signed)
Immediate Anesthesia Transfer of Care Note  Patient: Rickey Cochran  Procedure(s) Performed: IMPLANTATION OF HYPOGLOSSAL NERVE STIMULATOR (Right: Chest)  Patient Location: PACU  Anesthesia Type:General  Level of Consciousness: awake, alert , oriented, and patient cooperative  Airway & Oxygen Therapy: Patient Spontanous Breathing and Patient connected to face mask oxygen  Post-op Assessment: Report given to RN and Post -op Vital signs reviewed and stable  Post vital signs: Reviewed and stable  Last Vitals:  Vitals Value Taken Time  BP    Temp    Pulse 46 03/16/23 1427  Resp    SpO2 96 % 03/16/23 1427  Vitals shown include unvalidated device data.  Last Pain:  Vitals:   03/16/23 1034  TempSrc: Oral  PainSc: 0-No pain      Patients Stated Pain Goal: 1 (03/16/23 1034)  Complications: No notable events documented.

## 2023-03-16 NOTE — Op Note (Signed)

## 2023-03-16 NOTE — Anesthesia Procedure Notes (Signed)
Procedure Name: Intubation Date/Time: 03/16/2023 12:32 PM  Performed by: Wilferd Ritson, Jewel Baize, CRNAPre-anesthesia Checklist: Patient identified, Emergency Drugs available, Suction available and Patient being monitored Patient Re-evaluated:Patient Re-evaluated prior to induction Oxygen Delivery Method: Circle system utilized Preoxygenation: Pre-oxygenation with 100% oxygen Induction Type: IV induction Ventilation: Mask ventilation without difficulty Laryngoscope Size: Mac and 3 Grade View: Grade I Tube type: Oral Tube size: 7.0 mm Number of attempts: 1 Airway Equipment and Method: Stylet and Oral airway Placement Confirmation: ETT inserted through vocal cords under direct vision, positive ETCO2 and breath sounds checked- equal and bilateral Secured at: 21 cm Tube secured with: Tape Dental Injury: Teeth and Oropharynx as per pre-operative assessment

## 2023-03-16 NOTE — Progress Notes (Signed)
[name] notified of XRay results.

## 2023-03-16 NOTE — Anesthesia Postprocedure Evaluation (Signed)
Anesthesia Post Note  Patient: Rickey Cochran  Procedure(s) Performed: IMPLANTATION OF HYPOGLOSSAL NERVE STIMULATOR (Right: Chest)     Patient location during evaluation: PACU Anesthesia Type: General Level of consciousness: awake and alert Pain management: pain level controlled Vital Signs Assessment: post-procedure vital signs reviewed and stable Respiratory status: spontaneous breathing, nonlabored ventilation, respiratory function stable and patient connected to nasal cannula oxygen Cardiovascular status: blood pressure returned to baseline and stable Postop Assessment: no apparent nausea or vomiting Anesthetic complications: no   No notable events documented.  Last Vitals:  Vitals:   03/16/23 1445 03/16/23 1500  BP: 111/74 115/65  Pulse: (!) 57 (!) 47  Resp: 18 18  Temp:  36.7 C  SpO2: 96% 96%    Last Pain:  Vitals:   03/16/23 1500  TempSrc:   PainSc: 0-No pain                 Talon Witting

## 2023-03-16 NOTE — H&P (Signed)
Rickey Cochran is an 62 y.o. male.   Chief Complaint: Sleep apnea HPI: 62 year old with obstructive sleep apnea who has been unable to tolerate CPAP.  Past Medical History:  Diagnosis Date   Arthritis    BPH (benign prostatic hyperplasia)    Cancer    s/p partial thyroidectomy   Depression    Dysrhythmia    Hypertension    several years ago was on lisiopril but lost weight and no longer needs   Hypothyroidism    mgd on levothyroxine    Left-sided tinnitus    Premature atrial contraction    was seen on EKG at his PCP approx 8 years ago, denies any symptoms, reports he was a runner before onset of hip pain in feb 2020   Sleep apnea    not using CPAP    Past Surgical History:  Procedure Laterality Date   COLONOSCOPY     DRUG INDUCED ENDOSCOPY N/A 10/27/2022   Procedure: DRUG INDUCED SLEEP ENDOSCOPY;  Surgeon: Osborn Coho, MD;  Location: [facility];  Service: ENT;  Laterality: N/A;   IR THORACENTESIS ASP PLEURAL SPACE W/IMG GUIDE  03/20/2021   JOINT REPLACEMENT Right 04/13/2019   right total hip arthroplasty   THYROID LOBECTOMY Right 05/09/2018   THYROID LOBECTOMY Right 05/09/2018   Procedure: RIGHT THYROID LOBECTOMY;  Surgeon: Axel Filler, MD;  Location: [facility] OR;  Service: General;  Laterality: Right;   TOTAL HIP ARTHROPLASTY Right 04/13/2019   Procedure: RIGHT TOTAL HIP ARTHROPLASTY ANTERIOR APPROACH;  Surgeon: Kathryne Hitch, MD;  Location: [facility] ORS;  Service: Orthopedics;  Laterality: Right;   TOTAL KNEE ARTHROPLASTY Left 11/12/2021   Procedure: LEFT TOTAL KNEE ARTHROPLASTY;  Surgeon: Cammy Copa, MD;  Location: [facility] OR;  Service: Orthopedics;  Laterality: Left;    History reviewed. No pertinent family history. Social History:  reports that he has quit smoking. He has never used smokeless tobacco. He reports that he does not currently use alcohol. He reports that he does not use drugs.  Allergies: No Known Allergies  Medications Prior to  Admission  Medication Sig Dispense Refill   acetaminophen (TYLENOL) 500 MG tablet Take 1,000 mg by mouth every 6 (six) hours as needed (pain).     alfuzosin (UROXATRAL) 10 MG 24 hr tablet TAKE 1 TABLET BY MOUTH ONCE DAILY WITH BREAKFAST 90 tablet 1   ibuprofen (ADVIL) 800 MG tablet Take 1 tablet (800 mg total) by mouth 3 (three) times daily with food as needed 60 tablet 4   levothyroxine (SYNTHROID) 88 MCG tablet Take 1 tablet (88 mcg total) by mouth every morning on an empty stomach 90 tablet 1   metoprolol succinate (TOPROL XL) 25 MG 24 hr tablet Take 1 tablet  by mouth daily. (Patient taking differently: Take 25 mg by mouth at bedtime.) 90 tablet 3   Multiple Vitamin (MULTIVITAMIN WITH MINERALS) TABS tablet Take 1 tablet by mouth in the morning.     rosuvastatin (CRESTOR) 10 MG tablet Take 1 tablet (10 mg total) by mouth daily. 30 tablet 11   sertraline (ZOLOFT) 100 MG tablet TAKE 1 TABLET BY MOUTH EVERY NIGHT AT BEDTIME 90 tablet 1   amoxicillin (AMOXIL) 500 MG capsule Take 4 capsules by mouth 1 hour prior to dental procedure. 10 capsule 0    No results found for this or any previous visit (from the past 48 hour(s)). No results found.  Review of Systems  All other systems reviewed and are negative.   Pulse Marland Kitchen)  111, temperature 99 F (37.2 C), temperature source Oral, resp. rate 18, height  (1.88 m), weight 97.7 kg, SpO2 98 %. Physical Exam Constitutional:      Appearance: Normal appearance. He is normal weight.  HENT:     Head: Normocephalic and atraumatic.     Right Ear: External ear normal.     Left Ear: External ear normal.     Nose: Nose normal.     Mouth/Throat:     Mouth: Mucous membranes are moist.     Pharynx: Oropharynx is clear.  Eyes:     Extraocular Movements: Extraocular movements intact.     Conjunctiva/sclera: Conjunctivae normal.     Pupils: Pupils are equal, round, and reactive to light.  Cardiovascular:     Rate and Rhythm: Normal rate.  Pulmonary:      Effort: Pulmonary effort is normal.  Neurological:     General: No focal deficit present.     Mental Status: He is alert and oriented to person, place, and time.  Psychiatric:        Mood and Affect: Mood normal.        Behavior: Behavior normal.        Thought Content: Thought content normal.        Judgment: Judgment normal.      Assessment/Plan Obstructive sleep apnea and BMI 27.65  To OR for hypoglossal nerve stimulator placement.  Christia Reading, MD 03/16/2023, 11:46 AM

## 2023-03-17 ENCOUNTER — Encounter (HOSPITAL_BASED_OUTPATIENT_CLINIC_OR_DEPARTMENT_OTHER): Payer: Self-pay | Admitting: Otolaryngology

## 2023-04-19 ENCOUNTER — Telehealth: Payer: Self-pay | Admitting: Neurology

## 2023-04-19 ENCOUNTER — Encounter: Payer: Self-pay | Admitting: Neurology

## 2023-04-19 ENCOUNTER — Ambulatory Visit (INDEPENDENT_AMBULATORY_CARE_PROVIDER_SITE_OTHER): Payer: 59 | Admitting: Neurology

## 2023-04-19 VITALS — BP 126/63 | HR 41 | Ht 74.0 in | Wt 220.2 lb

## 2023-04-19 DIAGNOSIS — Z9682 Presence of neurostimulator: Secondary | ICD-10-CM | POA: Diagnosis not present

## 2023-04-19 DIAGNOSIS — G4733 Obstructive sleep apnea (adult) (pediatric): Secondary | ICD-10-CM

## 2023-04-19 DIAGNOSIS — Z789 Other specified health status: Secondary | ICD-10-CM | POA: Diagnosis not present

## 2023-04-19 NOTE — Progress Notes (Signed)
Subjective:    Patient ID: Rickey Cochran is a 62 y.o. male.  HPI    Interim history:   Rickey Cochran is a 62 year old right-handed gentleman with an underlying medical history of sinus bradycardia, PACs, osteoarthritis, status post right hip replacement, status post left knee replacement, thyroid cancer with status post surgery, hypothyroidism, hypertension, depression, anxiety, BPH, recent rib fractures in April 2022 with complication of hemothorax, overweight state, paroxysmal A. fib, who presents for follow-up consultation of his obstructive sleep apnea, with status post hypoglossal nerve stimulator implantation.  The patient is unaccompanied today and presents for his activation appointment.  I last saw him on 06/23/2022, at which time he reported that he had not used his machine secondary to difficulty tolerating it.  He had tried different masks.  In the month of May 2023 he used his machine about 50% of the time above 4 hours.  Residual AHI was mildly elevated around 11/h.  We talked about Inspire treatment and he wanted to pursue this treatment option as an alternative to CPAP therapy.  I made a referral to ENT.  Today, 04/19/2023: He reports doing well, healing has gone well, he does have a healing scar under the chin and some knots that he can feel but nothing painful, no difficulty swallowing or speaking or chewing.  He is not on any narcotic pain medication.  Per insurance requirements, we had to do a sleep study again.  He had a baseline polysomnogram on 01/04/2023 which showed moderate to severe obstructive sleep apnea, with a total AHI of 21.8/hour, REM AHI of 5.5/hour, supine AHI of 81.8/hour and O2 nadir of 66%.  There was no supine REM sleep achieved during the study.  He saw Dr. Osborn Cochran on 09/07/2022 for initial consultation.  He had a subsequent drug-induced sleep endoscopy (DISE) on 10/27/2022 with Dr. Annalee Cochran.  He transferred care to Dr. Christia Cochran and had an appointment  with Dr. Jenne Cochran on 03/10/2023 and had his hypoglossal nerve stimulator implanted on 03/16/2023.  He reports that he typically is restless at night, he may sleep on his right side or back or left side.  We talked about PLM's noted during his study but he was also advised that his PLM's may improve with time as his sleep apnea control improves and PLM's can also be seen with certain medications such as his sertraline.  Activation went well, good tongue movements were noted, functional level was 0.6 V and his range was set to 0.6 to 1.6 V.  Start delay remained at 30 minutes, pause at 15 minutes, duration of treatment was extended from 8 to 9 hours.  The patient's allergies, current medications, family history, past medical history, past social history, past surgical history and problem list were reviewed and updated as appropriate.    Previously:    I first met him at the request of his primary care physician on 05/18/2021, at which time the patient reported witnessed apneas as well as snoring and daytime somnolence.  He was advised to proceed with a sleep study.  He had a home sleep test on 06/10/2021 which indicated severe obstructive sleep apnea with a total AHI of 33.2/hour and O2 nadir of 84%, with variable snoring detected.  He was advised to start AutoPap therapy.   He had a follow-up visit with Rickey Dapper, NP on 10/13/2021, at which time he reported difficulty tolerating AutoPap, he had used his machine twice in 45 days.   He had a follow-up appointment with  Rickey Dapper, NP on 03/23/2022, at which time he used his machine 12 out of 30 days with percent use days greater than 4 hours at 7% only in the month of March 2023.  He declined a referral for desensitization with DME provider.     05/18/21: (He) reports snoring and excessive daytime somnolence, as well as witnessed apneas per wife's feedback.  I reviewed your office note from 02/17/2021.  His Epworth sleepiness score is 11 out of 24, fatigue severity  score is 42 out of 63.  His wife is a Engineer, civil (consulting) and has encouraged him to get checked for sleep apnea.  His father has sleep apnea.  His father also has a pacemaker.  His mom has A. fib.  Patient reports not being able to stay awake for long in the evening, falls asleep quickly.  He goes to bed generally between 9 and 10 PM and rise time is generally around 6 AM.  He has nocturia about once or twice per average night but denies recurrent morning headaches.  He lives with his wife, he has a grown daughter and his stepson recently moved out.  They have 2 dogs in the household.  He typically does not watch TV in the bedroom.  He works in Economist.  He has reduced his caffeine intake and limit himself to 2 cups of coffee in the mornings.  He does not drink alcohol and quit smoking in 2007.    His Past Medical History Is Significant For: Past Medical History:  Diagnosis Date   Arthritis    BPH (benign prostatic hyperplasia)    Cancer (HCC)    s/p partial thyroidectomy   Depression    Dysrhythmia    Hypertension    several years ago was on lisiopril but lost weight and no longer needs   Hypothyroidism    mgd on levothyroxine    Left-sided tinnitus    Premature atrial contraction    was seen on EKG at his PCP approx 8 years ago, denies any symptoms, reports he was a runner before onset of hip pain in feb 2020   Sleep apnea    not using CPAP    His Past Surgical History Is Significant For: Past Surgical History:  Procedure Laterality Date   COLONOSCOPY     DRUG INDUCED ENDOSCOPY N/A 10/27/2022   Procedure: DRUG INDUCED SLEEP ENDOSCOPY;  Surgeon: Rickey Coho, MD;  Location: [facility];  Service: ENT;  Laterality: N/A;   IMPLANTATION OF HYPOGLOSSAL NERVE STIMULATOR Right 03/16/2023   Procedure: IMPLANTATION OF HYPOGLOSSAL NERVE STIMULATOR;  Surgeon: Rickey Reading, MD;  Location: [facility];  Service: ENT;  Laterality: Right;   IR THORACENTESIS ASP PLEURAL  SPACE W/IMG GUIDE  03/20/2021   JOINT REPLACEMENT Right 04/13/2019   right total hip arthroplasty   THYROID LOBECTOMY Right 05/09/2018   THYROID LOBECTOMY Right 05/09/2018   Procedure: RIGHT THYROID LOBECTOMY;  Surgeon: Axel Filler, MD;  Location: [facility] OR;  Service: General;  Laterality: Right;   TOTAL HIP ARTHROPLASTY Right 04/13/2019   Procedure: RIGHT TOTAL HIP ARTHROPLASTY ANTERIOR APPROACH;  Surgeon: Kathryne Hitch, MD;  Location: [facility];  Service: Orthopedics;  Laterality: Right;   TOTAL KNEE ARTHROPLASTY Left 11/12/2021   Procedure: LEFT TOTAL KNEE ARTHROPLASTY;  Surgeon: Cammy Copa, MD;  Location: [facility] OR;  Service: Orthopedics;  Laterality: Left;    His Family History Is Significant For: Family History  Problem Relation Age of Onset   Atrial fibrillation Mother  Arthritis Mother    Sleep apnea Father    Heart Problems Father        has a Visual merchandiser    His Social History Is Significant For: Social History   Socioeconomic History   Marital status: Married    Spouse name: Not on file   Number of children: Not on file   Years of education: Not on file   Highest education level: Not on file  Occupational History    Comment: Self employed  Tobacco Use   Smoking status: Former   Smokeless tobacco: Never   Tobacco comments:    QUIT IN 2004  Vaping Use   Vaping Use: Never used  Substance and Sexual Activity   Alcohol use: Not Currently    Alcohol/week: 0.0 standard drinks of alcohol    Comment: quit   Drug use: Never   Sexual activity: Yes  Other Topics Concern   Not on file  Social History Narrative   He and his wife are vegan and very active in hiking, backpacking, cycling.   Social Determinants of Health   Financial Resource Strain: Not on file  Food Insecurity: Not on file  Transportation Needs: Not on file  Physical Activity: Not on file  Stress: Not on file  Social Connections: Not on file    His Allergies Are:  No Known Allergies:    His Current Medications Are:  Outpatient Encounter Medications as of 04/19/2023  Medication Sig   acetaminophen (TYLENOL) 500 MG tablet Take 1,000 mg by mouth every 6 (six) hours as needed (pain).   alfuzosin (UROXATRAL) 10 MG 24 hr tablet TAKE 1 TABLET BY MOUTH ONCE DAILY WITH BREAKFAST   ibuprofen (ADVIL) 800 MG tablet Take 1 tablet (800 mg total) by mouth 3 (three) times daily with food as needed   ibuprofen (ADVIL) 800 MG tablet Take 1 tablet (800 mg total) by mouth every 8 (eight) hours as needed.   levothyroxine (SYNTHROID) 88 MCG tablet Take 1 tablet (88 mcg total) by mouth every morning on an empty stomach   metoprolol succinate (TOPROL XL) 25 MG 24 hr tablet Take 1 tablet  by mouth daily. (Patient taking differently: Take 25 mg by mouth at bedtime.)   Multiple Vitamin (MULTIVITAMIN WITH MINERALS) TABS tablet Take 1 tablet by mouth in the morning.   rosuvastatin (CRESTOR) 10 MG tablet Take 1 tablet (10 mg total) by mouth daily.   sertraline (ZOLOFT) 100 MG tablet TAKE 1 TABLET BY MOUTH EVERY NIGHT AT BEDTIME   amoxicillin (AMOXIL) 500 MG capsule Take 4 capsules by mouth 1 hour prior to dental procedure. (Patient not taking: Reported on 04/19/2023)   [DISCONTINUED] HYDROcodone-acetaminophen (NORCO/VICODIN) 5-325 MG tablet Take 1-2 tablets by mouth every 6 (six) hours as needed for moderate pain.   No facility-administered encounter medications on file as of 04/19/2023.  :  Review of Systems:  Out of a complete 14 point review of systems, all are reviewed and negative with the exception of these symptoms as listed below:  Review of Systems  Neurological:        Inspire follow up, Dr. Jenne Cochran 03-16-2023 surgery. ESS. 4.    Objective:  Neurological Exam  Physical Exam Physical Examination:   Vitals:   04/19/23 0829  BP: 126/63  Pulse: (!) 41    General Examination: The patient is a very pleasant 62 y.o. male in no acute distress. He appears well-developed and well-nourished  and well groomed.   HEENT: Normocephalic, atraumatic, pupils are equal,  round and reactive to light, tracking is well-preserved, face is symmetric with normal facial animation, speech is clear without dysarthria, hypophonia or voice tremor.  Otherwise stable findings, unremarkable thyroidectomy scar, unremarkable and healing scar from hypoglossal nerve stimulator electrode placement under the right side of the chin.     Chest: Clear to auscultation without wheezing, rhonchi or crackles noted.   Heart: S1+S2+0, regular and normal without murmurs, rubs or gallops noted.  Bradycardia noted.   Abdomen: Soft, non-tender and non-distended.   Extremities: There is no obvious edema in the distal lower extremities bilaterally.    Skin: Warm and dry without trophic changes noted.    Musculoskeletal: exam reveals no obvious joint deformities.    Neurologically:  Mental status: The patient is awake, alert and oriented in all 4 spheres. His immediate and remote memory, attention, language skills and fund of knowledge are appropriate. There is no evidence of aphasia, agnosia, apraxia or anomia. Speech is clear with normal prosody and enunciation. Thought process is linear. Mood is normal and affect is normal.  Cranial nerves II - XII are as described above under HEENT exam.  Motor exam: Normal bulk, moving all 4 extremities, no obvious action or resting tremor.   Fine motor skills and coordination: grossly intact.  Cerebellar testing: No dysmetria or intention tremor. There is no truncal or gait ataxia.  Sensory exam: intact to light touch.    Assessment and Plan:  In summary, Rickey Cochran is a very pleasant 62 year old right-handed gentleman with an underlying medical history of sinus bradycardia, PACs, osteoarthritis, status post right hip replacement, status post left knee replacement, thyroid cancer with status post surgery, hypothyroidism, hypertension, depression, anxiety, BPH, recent rib  fractures in April 2022 with complication of hemothorax, overweight state, paroxysmal A. fib, who presents for follow-up consultation of his obstructive sleep apnea, PAP intolerance and status post hypoglossal nerve stimulator implantation.  He had his activation appointment today.  He had a baseline polysomnogram on 01/04/2023 which showed moderate to severe obstructive sleep apnea, with a total AHI of 21.8/hour, REM AHI of 5.5/hour, supine AHI of 81.8/hour and O2 nadir of 66%.  There was no supine REM sleep achieved during the study.  He saw Dr. Osborn Cochran on 09/07/2022 for initial consultation.  He had a subsequent drug-induced sleep endoscopy (DISE) on 10/27/2022 with Dr. Annalee Cochran.  He transferred care to Dr. Christia Cochran and had an appointment with Dr. Jenne Cochran on 03/10/2023 and had his hypoglossal nerve stimulator implanted on 03/16/2023.6 His activation went well today, his treatment range was set to 0.6 V at 1.6 V.  Education and remote control use was discussed and patient had no questions.  He was given a log for his stimulation increase over the next few weeks.  We will plan a readiness follow-up for a titration study in about 3 months, we will pursue a titration study with inspire after that.  We will plan a follow-up after his titration study about 2 weeks later.  We will give a call in about a month to see how he is doing with his stimulation increase.  He is encouraged to call or email through MyChart with any interim questions or concerns he may have.  I answered all his questions today and he was in agreement.  He was also given instructions in his after visit summary electronically.   I spent 40 minutes in total face-to-face time and in reviewing records during pre-charting, more than 50% of which was spent in counseling  and coordination of care, reviewing test results, reviewing medications and treatment regimen and/or in discussing or reviewing the diagnosis of OSA, inspire placement, PAP  intolerance, the prognosis and treatment options. Pertinent laboratory and imaging test results that were available during this visit with the patient were reviewed by me and considered in my medical decision making (see chart for details).

## 2023-04-19 NOTE — Telephone Encounter (Signed)
Patient came in today to see Dr. Frances Furbish for his inspire start up.  His 10 weeks follow up is schedule for 07/05/2023  Sleep study is scheduled for Tuesday 07/26/2023 at 8 pm.  14 weeks follow up is schedule for 08/09/2023.  Below is his inspire information.

## 2023-04-19 NOTE — Patient Instructions (Signed)
It was good to see you again today. We are excited to start the "Inspire-Journey" with you. I am glad you have done well after surgery. As explained, today was your activity appointment.  Every week you can increase the level 1 time.  We are starting you off on level one today, 0.6 V.  You have been scheduled for your overnight sleep study for Inspire-Titration in about 3 months. By then, you should be able to increase your stimulation to at least level 6, the settings go up to level 10.  Our sleep lab manager will get in touch with you by phone call in about a month.  We will plan a follow-up in sleep clinic in about 3 months and I will order a sleep study to see how well-controlled your sleep apnea is on your inspire settings.  We will call you with the results. We will arrange for a follow-up in the clinic about 2 weeks after your sleep study. Please feel free to call us or email [place] through MyChart for any questions or updates you may have.   For your sleep study and any sleep clinic appointments, please always bring your remote for the [facility] device.

## 2023-04-19 NOTE — Telephone Encounter (Signed)
Inspire [employer]- [employer] no auth req patient.  Patient is scheduled at [facility] for 07/26/2023 at 8 pm.  Mailed packet to the patient.

## 2023-05-09 ENCOUNTER — Other Ambulatory Visit: Payer: Self-pay | Admitting: Internal Medicine

## 2023-05-09 ENCOUNTER — Other Ambulatory Visit (HOSPITAL_COMMUNITY): Payer: Self-pay

## 2023-05-09 DIAGNOSIS — F418 Other specified anxiety disorders: Secondary | ICD-10-CM

## 2023-05-09 MED ORDER — SERTRALINE HCL 100 MG PO TABS
ORAL_TABLET | Freq: Every day | ORAL | 1 refills | Status: DC
  Filled 2023-05-09: qty 90, 90d supply, fill #0
  Filled 2023-08-09: qty 90, 90d supply, fill #1

## 2023-05-11 DIAGNOSIS — L84 Corns and callosities: Secondary | ICD-10-CM | POA: Diagnosis not present

## 2023-05-18 ENCOUNTER — Other Ambulatory Visit (HOSPITAL_COMMUNITY): Payer: Self-pay

## 2023-05-18 ENCOUNTER — Other Ambulatory Visit: Payer: Self-pay | Admitting: Internal Medicine

## 2023-05-18 MED ORDER — METOPROLOL SUCCINATE ER 25 MG PO TB24
25.0000 mg | ORAL_TABLET | Freq: Every day | ORAL | 0 refills | Status: DC
  Filled 2023-05-18: qty 30, 30d supply, fill #0

## 2023-05-30 ENCOUNTER — Telehealth: Payer: Self-pay | Admitting: Internal Medicine

## 2023-05-30 NOTE — Telephone Encounter (Signed)
*  STAT* If patient is at the pharmacy, call can be transferred to refill team.   1. Which medications need to be refilled? (please list name of each medication and dose if known)   metoprolol succinate (TOPROL XL) 25 MG 24 hr tablet    2. Which pharmacy/location (including street and city if local pharmacy) is medication to be sent to? [place] [facility]    3. Do they need a 30 day or 90 day supply? 90 day

## 2023-05-31 ENCOUNTER — Other Ambulatory Visit (HOSPITAL_COMMUNITY): Payer: Self-pay

## 2023-05-31 MED ORDER — METOPROLOL SUCCINATE ER 25 MG PO TB24
25.0000 mg | ORAL_TABLET | Freq: Every day | ORAL | 1 refills | Status: DC
  Filled 2023-05-31 – 2023-06-27 (×2): qty 90, 90d supply, fill #0
  Filled 2023-08-22 – 2023-09-15 (×2): qty 90, 90d supply, fill #1

## 2023-05-31 NOTE — Telephone Encounter (Signed)
Pt's medication was sent to pt's pharmacy as requested. Confirmation received.  °

## 2023-06-27 ENCOUNTER — Other Ambulatory Visit: Payer: Self-pay | Admitting: Internal Medicine

## 2023-06-27 ENCOUNTER — Other Ambulatory Visit: Payer: Self-pay

## 2023-06-27 ENCOUNTER — Other Ambulatory Visit (HOSPITAL_COMMUNITY): Payer: Self-pay

## 2023-06-27 DIAGNOSIS — N401 Enlarged prostate with lower urinary tract symptoms: Secondary | ICD-10-CM

## 2023-06-27 MED ORDER — ROSUVASTATIN CALCIUM 10 MG PO TABS
10.0000 mg | ORAL_TABLET | Freq: Every day | ORAL | 2 refills | Status: DC
  Filled 2023-06-27: qty 30, 30d supply, fill #0
  Filled 2023-07-27: qty 30, 30d supply, fill #1
  Filled 2023-08-22: qty 30, 30d supply, fill #2

## 2023-06-29 ENCOUNTER — Other Ambulatory Visit (HOSPITAL_COMMUNITY): Payer: Self-pay

## 2023-06-29 ENCOUNTER — Other Ambulatory Visit: Payer: Self-pay | Admitting: Internal Medicine

## 2023-06-29 DIAGNOSIS — N401 Enlarged prostate with lower urinary tract symptoms: Secondary | ICD-10-CM

## 2023-06-30 ENCOUNTER — Other Ambulatory Visit (HOSPITAL_COMMUNITY): Payer: Self-pay

## 2023-06-30 ENCOUNTER — Encounter: Payer: Self-pay | Admitting: Internal Medicine

## 2023-06-30 ENCOUNTER — Ambulatory Visit (INDEPENDENT_AMBULATORY_CARE_PROVIDER_SITE_OTHER): Payer: 59 | Admitting: Internal Medicine

## 2023-06-30 VITALS — BP 126/78 | HR 54 | Temp 97.6°F | Resp 16 | Ht 74.0 in | Wt 212.0 lb

## 2023-06-30 DIAGNOSIS — E785 Hyperlipidemia, unspecified: Secondary | ICD-10-CM | POA: Insufficient documentation

## 2023-06-30 DIAGNOSIS — R001 Bradycardia, unspecified: Secondary | ICD-10-CM | POA: Diagnosis not present

## 2023-06-30 DIAGNOSIS — R351 Nocturia: Secondary | ICD-10-CM

## 2023-06-30 DIAGNOSIS — E041 Nontoxic single thyroid nodule: Secondary | ICD-10-CM | POA: Diagnosis not present

## 2023-06-30 DIAGNOSIS — E890 Postprocedural hypothyroidism: Secondary | ICD-10-CM

## 2023-06-30 DIAGNOSIS — C73 Malignant neoplasm of thyroid gland: Secondary | ICD-10-CM | POA: Diagnosis not present

## 2023-06-30 DIAGNOSIS — N401 Enlarged prostate with lower urinary tract symptoms: Secondary | ICD-10-CM

## 2023-06-30 DIAGNOSIS — E7841 Elevated Lipoprotein(a): Secondary | ICD-10-CM | POA: Diagnosis not present

## 2023-06-30 DIAGNOSIS — Z0000 Encounter for general adult medical examination without abnormal findings: Secondary | ICD-10-CM

## 2023-06-30 LAB — LIPID PANEL
Cholesterol: 149 mg/dL (ref 0–200)
HDL: 46.4 mg/dL (ref >39.00)
LDL Cholesterol: 77 mg/dL (ref 0–99)
NonHDL: 102.63
Total CHOL/HDL Ratio: 3
Triglycerides: 129 mg/dL (ref 0.0–149.0)
VLDL: 25.8 mg/dL (ref 0.0–40.0)

## 2023-06-30 LAB — BASIC METABOLIC PANEL
BUN: 13 mg/dL (ref 6–23)
CO2: 29 mEq/L (ref 19–32)
Calcium: 9.3 mg/dL (ref 8.4–10.5)
Chloride: 104 mEq/L (ref 96–112)
Creatinine, Ser: 0.77 mg/dL (ref 0.40–1.50)
GFR: 95.92 mL/min (ref >60.00)
Glucose, Bld: 81 mg/dL (ref 70–99)
Potassium: 4.6 mEq/L (ref 3.5–5.1)
Sodium: 139 mEq/L (ref 135–145)

## 2023-06-30 LAB — HEPATIC FUNCTION PANEL
ALT: 37 U/L (ref 0–53)
AST: 29 U/L (ref 0–37)
Albumin: 4.2 g/dL (ref 3.5–5.2)
Alkaline Phosphatase: 50 U/L (ref 39–117)
Bilirubin, Direct: 0.2 mg/dL (ref 0.0–0.3)
Total Bilirubin: 1.1 mg/dL (ref 0.2–1.2)
Total Protein: 6.4 g/dL (ref 6.0–8.3)

## 2023-06-30 LAB — CBC WITH DIFFERENTIAL/PLATELET
Basophils Absolute: 0 10*3/uL (ref 0.0–0.1)
Basophils Relative: 0.4 % (ref 0.0–3.0)
Eosinophils Absolute: 0.1 10*3/uL (ref 0.0–0.7)
Eosinophils Relative: 1.6 % (ref 0.0–5.0)
HCT: 47.4 % (ref 39.0–52.0)
Hemoglobin: 15.5 g/dL (ref 13.0–17.0)
Lymphocytes Relative: 25 % (ref 12.0–46.0)
Lymphs Abs: 1.7 10*3/uL (ref 0.7–4.0)
MCHC: 32.7 g/dL (ref 30.0–36.0)
MCV: 95.4 fl (ref 78.0–100.0)
Monocytes Absolute: 0.4 10*3/uL (ref 0.1–1.0)
Monocytes Relative: 6.4 % (ref 3.0–12.0)
Neutro Abs: 4.5 10*3/uL (ref 1.4–7.7)
Neutrophils Relative %: 66.6 % (ref 43.0–77.0)
Platelets: 256 10*3/uL (ref 150.0–400.0)
RBC: 4.97 Mil/uL (ref 4.22–5.81)
RDW: 12.7 % (ref 11.5–15.5)
WBC: 6.8 10*3/uL (ref 4.0–10.5)

## 2023-06-30 LAB — URINALYSIS, ROUTINE W REFLEX MICROSCOPIC
Bilirubin Urine: NEGATIVE
Hgb urine dipstick: NEGATIVE
Ketones, ur: NEGATIVE
Leukocytes,Ua: NEGATIVE
Nitrite: NEGATIVE
RBC / HPF: NONE SEEN (ref 0–?)
Specific Gravity, Urine: <=1.005 — AB (ref 1.000–1.030)
Total Protein, Urine: NEGATIVE
Urine Glucose: NEGATIVE
Urobilinogen, UA: 0.2 (ref 0.0–1.0)
WBC, UA: NONE SEEN (ref 0–?)
pH: 7 (ref 5.0–8.0)

## 2023-06-30 LAB — TSH: TSH: 1.28 u[IU]/mL (ref 0.35–5.50)

## 2023-06-30 LAB — PSA: PSA: 2.28 ng/mL (ref 0.10–4.00)

## 2023-06-30 MED ORDER — ALFUZOSIN HCL ER 10 MG PO TB24
10.0000 mg | ORAL_TABLET | Freq: Every day | ORAL | 1 refills | Status: DC
Start: 2023-06-30 — End: 2024-02-15
  Filled 2023-06-30: qty 90, 90d supply, fill #0
  Filled 2023-08-22 – 2023-10-05 (×2): qty 90, 90d supply, fill #1

## 2023-06-30 NOTE — Progress Notes (Signed)
Subjective:  Patient ID: Rickey Cochran, male    DOB: Sep 03, 1961  Age: 62 y.o. MRN: [id number]  CC: Annual Exam, Hyperlipidemia, and Hypothyroidism   HPI Rickey Cochran presents for a CPX and f/up ----  Discussed the use of AI scribe software for clinical note transcription with the patient, who gave verbal consent to proceed.  History of Present Illness   The patient, with a history of sleep apnea, thyroid disease, and dysrhythmia disease, presents for a routine follow-up. He reports no chest pain, shortness of breath, dizziness, or lightheadedness, even after seven days of hiking up and down mountains. He denies any symptoms suggestive of thyroid dysfunction such as fatigue, constipation, weight gain, weight loss, or sleep disturbances.  The patient has been treated for sleep apnea with an Inspire device, which he reports has reduced his snoring significantly. He describes the device as having a generator placed in the chest, with a wire that attaches to the nerve in the tongue, moving the tongue forward at night to prevent airway blockage.  He also reports some memory issues, particularly with recalling names and dates, but does not express concern about this. He denies any pain and expresses gratitude for the medical interventions he has received, including a new knee and back surgery. He does not report any symptoms of weakness, dizziness, or lightheadedness despite a heart rate of 54.       Outpatient Medications Prior to Visit  Medication Sig Dispense Refill   acetaminophen (TYLENOL) 500 MG tablet Take 1,000 mg by mouth every 6 (six) hours as needed (pain).     levothyroxine (SYNTHROID) 88 MCG tablet Take 1 tablet (88 mcg total) by mouth every morning on an empty stomach 90 tablet 1   metoprolol succinate (TOPROL XL) 25 MG 24 hr tablet Take 1 tablet  by mouth daily. 90 tablet 1   rosuvastatin (CRESTOR) 10 MG tablet Take 1 tablet (10 mg total) by mouth daily. 30 tablet 2    sertraline (ZOLOFT) 100 MG tablet TAKE 1 TABLET BY MOUTH EVERY NIGHT AT BEDTIME 90 tablet 1   alfuzosin (UROXATRAL) 10 MG 24 hr tablet TAKE 1 TABLET BY MOUTH ONCE DAILY WITH BREAKFAST 90 tablet 1   ibuprofen (ADVIL) 800 MG tablet Take 1 tablet (800 mg total) by mouth 3 (three) times daily with food as needed 60 tablet 4   ibuprofen (ADVIL) 800 MG tablet Take 1 tablet (800 mg total) by mouth every 8 (eight) hours as needed. 21 tablet 0   Multiple Vitamin (MULTIVITAMIN WITH MINERALS) TABS tablet Take 1 tablet by mouth in the morning.     amoxicillin (AMOXIL) 500 MG capsule Take 4 capsules by mouth 1 hour prior to dental procedure. (Patient not taking: Reported on 04/19/2023) 10 capsule 0   No facility-administered medications prior to visit.    ROS Review of Systems  Constitutional: Negative.  Negative for chills, diaphoresis, fatigue and fever.  HENT: Negative.    Eyes: Negative.   Respiratory:  Positive for apnea. Negative for shortness of breath and wheezing.   Cardiovascular:  Negative for chest pain, palpitations and leg swelling.  Gastrointestinal:  Negative for abdominal pain, constipation, diarrhea, nausea and vomiting.  Endocrine: Negative.  Negative for cold intolerance and heat intolerance.  Genitourinary:  Positive for difficulty urinating.       +++ nocturia  Musculoskeletal: Negative.  Negative for back pain and myalgias.  Skin: Negative.  Negative for color change.  Allergic/Immunologic: Negative.   Neurological: Negative.  Negative for dizziness.  Hematological:  Negative for adenopathy. Does not bruise/bleed easily.  Psychiatric/Behavioral: Negative.      Objective:  BP 126/78 (BP Location: Right Arm, Patient Position: Sitting, Cuff Size: Large)   Pulse (!) 54   Temp 97.6 F (36.4 C) (Oral)   Resp 16   Ht 6\' 2"  (1.88 m)   Wt 212 lb (96.2 kg)   SpO2 96%   BMI 27.22 kg/m   BP Readings from Last 3 Encounters:  06/30/23 126/78  04/19/23 126/63  03/16/23 115/65     Wt Readings from Last 3 Encounters:  06/30/23 212 lb (96.2 kg)  04/19/23 220 lb 3.2 oz (99.9 kg)  03/16/23 215 lb 6.2 oz (97.7 kg)    Physical Exam Vitals reviewed.  Constitutional:      Appearance: Normal appearance.  HENT:     Nose: Nose normal.     Mouth/Throat:     Mouth: Mucous membranes are moist.  Eyes:     General: No scleral icterus.    Conjunctiva/sclera: Conjunctivae normal.  Neck:     Thyroid: No thyroid mass, thyromegaly or thyroid tenderness.  Cardiovascular:     Rate and Rhythm: Bradycardia present.     Heart sounds: Normal heart sounds, S1 normal and S2 normal. No murmur heard.    No friction rub. No gallop.     Comments: EKG- SB, 52 bpm No LVH, Q waves, or ST/T waves  Pulmonary:     Effort: Pulmonary effort is normal.     Breath sounds: No stridor. No wheezing, rhonchi or rales.  Abdominal:     General: Abdomen is flat.     Palpations: There is no mass.     Tenderness: There is no abdominal tenderness. There is no guarding.     Hernia: No hernia is present. There is no hernia in the left inguinal area or right inguinal area.  Genitourinary:    Pubic Area: No rash.      Penis: Normal and circumcised.      Testes: Normal.     Epididymis:     Right: Normal.     Left: Normal.     Prostate: Enlarged. Not tender and no nodules present.     Rectum: Normal. Guaiac result negative. No mass, tenderness, anal fissure, external hemorrhoid or internal hemorrhoid. Normal anal tone.  Musculoskeletal:        General: Normal range of motion.     Right lower leg: No edema.     Left lower leg: No edema.  Lymphadenopathy:     Cervical: No cervical adenopathy.     Lower Body: No right inguinal adenopathy. No left inguinal adenopathy.  Skin:    General: Skin is warm and dry.  Neurological:     General: No focal deficit present.     Mental Status: He is alert. Mental status is at baseline.  Psychiatric:        Mood and Affect: Mood normal.        Behavior:  Behavior normal.     Lab Results  Component Value Date   WBC 6.8 06/30/2023   HGB 15.5 06/30/2023   HCT 47.4 06/30/2023   PLT 256.0 06/30/2023   GLUCOSE 81 06/30/2023   CHOL 149 06/30/2023   TRIG 129.0 06/30/2023   HDL 46.40 06/30/2023   LDLDIRECT 151.0 02/17/2021   LDLCALC 77 06/30/2023   ALT 37 06/30/2023   AST 29 06/30/2023   NA 139 06/30/2023   K 4.6 06/30/2023  CL 104 06/30/2023   CREATININE 0.77 06/30/2023   BUN 13 06/30/2023   CO2 29 06/30/2023   TSH 1.28 06/30/2023   PSA 2.28 06/30/2023   HGBA1C 5.3 05/23/2019    DG Chest 1 View  Result Date: 03/16/2023 CLINICAL DATA:  Provided history: OSA (obstructive sleep apnea). Status post implantation of hypoglossal nerve stimulator. EXAM: CHEST  1 VIEW COMPARISON:  Prior chest radiographs 06/29/2021 and earlier. FINDINGS: Heart size within normal limits. Small foci of linear atelectasis or scarring within the mid and lower left lung. No appreciable airspace consolidation or pulmonary edema. No evidence of pleural effusion or pneumothorax. No acute osseous abnormality identified. Chronic right rib fracture deformities. A generator pack projects in the region of the right chest with a single lead extending toward the neck. This consistent with the given history of hypoglossal nerve stimulator implantation. IMPRESSION: 1. Small foci of linear atelectasis or scarring within the mid and lower left lung. 2. Otherwise, no evidence of acute cardiopulmonary abnormality. 3. Hypoglossal nerve stimulator. Electronically Signed   By: Jackey Loge D.O.   On: 03/16/2023 15:11   DG Neck Soft Tissue  Result Date: 03/16/2023 CLINICAL DATA:  Implantation of hypoglossal nerve stimulator EXAM: NECK SOFT TISSUES - 1 VIEW COMPARISON:  None Available. FINDINGS: Single lateral view of the neck demonstrates hypoglossal nerve stimulator lead terminating at the base of the tongue. The lead extends inferiorly off the field of view. There is no evidence of  retropharyngeal soft tissue swelling or epiglottic enlargement. IMPRESSION: Hypoglossal nerve stimulator lead terminating at the base of the tongue. Electronically Signed   By: Wiliam Ke M.D.   On: 03/16/2023 15:11    Assessment & Plan:   BPH associated with nocturia -     PSA; Future -     Urinalysis, Routine w reflex microscopic; Future -     Alfuzosin HCl ER; Take 1 tablet (10 mg total) by mouth daily with breakfast.  Dispense: 90 tablet; Refill: 1  Bradycardia- He is ax with this. 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 -     TSH; Future  Routine general medical examination at health care facility- Exam completed, labs reviewed, vaccines reviewed and updated, cancer screenings addressed, pt ed material was given.   Nontoxic single thyroid nodule -     TSH; Future -     US THYROID; Future -     Thyroxine binding globulin; Future  Dyslipidemia, goal LDL below 100 -     Lipoprotein A (LPA); Future -     Basic metabolic panel; Future -     CBC with Differential/Platelet; Future -     Hepatic function panel; Future -     Lipid panel; Future -     CT CARDIAC SCORING (SELF PAY ONLY); Future  Thyroid cancer (HCC) -     US THYROID; Future -     Thyroxine binding globulin; Future  High serum lipoprotein(a)- Will evaluate for CAD with a CCS. -     CT CARDIAC SCORING (SELF PAY ONLY); Future     Follow-up: Return in about 6 months (around 12/31/2023).  Sanda Linger, MD

## 2023-06-30 NOTE — Patient Instructions (Signed)
Health Maintenance, Male Adopting a healthy lifestyle and getting preventive care are important in promoting health and wellness. Ask your health care provider about: The right schedule for you to have regular tests and exams. Things you can do on your own to prevent diseases and keep yourself healthy. What should I know about diet, weight, and exercise? Eat a healthy diet  Eat a diet that includes plenty of vegetables, fruits, low-fat dairy products, and lean protein. Do not eat a lot of foods that are high in solid fats, added sugars, or sodium. Maintain a healthy weight Body mass index (BMI) is a measurement that can be used to identify possible weight problems. It estimates body fat based on height and weight. Your health care provider can help determine your BMI and help you achieve or maintain a healthy weight. Get regular exercise Get regular exercise. This is one of the most important things you can do for your health. Most adults should: Exercise for at least 150 minutes each week. The exercise should increase your heart rate and make you sweat (moderate-intensity exercise). Do strengthening exercises at least twice a week. This is in addition to the moderate-intensity exercise. Spend less time sitting. Even light physical activity can be beneficial. Watch cholesterol and blood lipids Have your blood tested for lipids and cholesterol at 62 years of age, then have this test every 5 years. You may need to have your cholesterol levels checked more often if: Your lipid or cholesterol levels are high. You are older than 62 years of age. You are at high risk for heart disease. What should I know about cancer screening? Many types of cancers can be detected early and may often be prevented. Depending on your health history and family history, you may need to have cancer screening at various ages. This may include screening for: Colorectal cancer. Prostate cancer. Skin cancer. Lung  cancer. What should I know about heart disease, diabetes, and high blood pressure? Blood pressure and heart disease High blood pressure causes heart disease and increases the risk of stroke. This is more likely to develop in people who have high blood pressure readings or are overweight. Talk with your health care provider about your target blood pressure readings. Have your blood pressure checked: Every 3-5 years if you are 18-39 years of age. Every year if you are 40 years old or older. If you are between the ages of 65 and 75 and are a current or former smoker, ask your health care provider if you should have a one-time screening for abdominal aortic aneurysm (AAA). Diabetes Have regular diabetes screenings. This checks your fasting blood sugar level. Have the screening done: Once every three years after age 45 if you are at a normal weight and have a low risk for diabetes. More often and at a younger age if you are overweight or have a high risk for diabetes. What should I know about preventing infection? Hepatitis B If you have a higher risk for hepatitis B, you should be screened for this virus. Talk with your health care provider to find out if you are at risk for hepatitis B infection. Hepatitis C Blood testing is recommended for: Everyone born from 1945 through 1965. Anyone with known risk factors for hepatitis C. Sexually transmitted infections (STIs) You should be screened each year for STIs, including gonorrhea and chlamydia, if: You are sexually active and are younger than 62 years of age. You are older than 62 years of age and your   health care provider tells you that you are at risk for this type of infection. Your sexual activity has changed since you were last screened, and you are at increased risk for chlamydia or gonorrhea. Ask your health care provider if you are at risk. Ask your health care provider about whether you are at high risk for HIV. Your health care provider  may recommend a prescription medicine to help prevent HIV infection. If you choose to take medicine to prevent HIV, you should first get tested for HIV. You should then be tested every 3 months for as long as you are taking the medicine. Follow these instructions at home: Alcohol use Do not drink alcohol if your health care provider tells you not to drink. If you drink alcohol: Limit how much you have to 0-2 drinks a day. Know how much alcohol is in your drink. In the [place] one drink equals one 12 oz bottle of beer (355 mL), one 5 oz glass of wine (148 mL), or one 1 oz glass of hard liquor (44 mL). Lifestyle Do not use any products that contain nicotine or tobacco. These products include cigarettes, chewing tobacco, and vaping devices, such as e-cigarettes. If you need help quitting, ask your health care provider. Do not use street drugs. Do not share needles. Ask your health care provider for help if you need support or information about quitting drugs. General instructions Schedule regular health, dental, and eye exams. Stay current with your vaccines. Tell your health care provider if: You often feel depressed. You have ever been abused or do not feel safe at home. Summary Adopting a healthy lifestyle and getting preventive care are important in promoting health and wellness. Follow your health care provider's instructions about healthy diet, exercising, and getting tested or screened for diseases. Follow your health care provider's instructions on monitoring your cholesterol and blood pressure. This information is not intended to replace advice given to you by your health care provider. Make sure you discuss any questions you have with your health care provider. Document Revised: 03/30/2021 Document Reviewed: 03/30/2021 [employer] Patient Education  2024 [employer]  

## 2023-07-04 ENCOUNTER — Encounter: Payer: Self-pay | Admitting: Internal Medicine

## 2023-07-05 ENCOUNTER — Encounter: Payer: Self-pay | Admitting: Neurology

## 2023-07-05 ENCOUNTER — Telehealth: Payer: Self-pay | Admitting: Neurology

## 2023-07-05 ENCOUNTER — Ambulatory Visit (INDEPENDENT_AMBULATORY_CARE_PROVIDER_SITE_OTHER): Payer: 59 | Admitting: Neurology

## 2023-07-05 VITALS — BP 123/55 | HR 40 | Ht 74.0 in | Wt 209.0 lb

## 2023-07-05 DIAGNOSIS — Z789 Other specified health status: Secondary | ICD-10-CM | POA: Diagnosis not present

## 2023-07-05 DIAGNOSIS — G4733 Obstructive sleep apnea (adult) (pediatric): Secondary | ICD-10-CM | POA: Diagnosis not present

## 2023-07-05 DIAGNOSIS — Z9682 Presence of neurostimulator: Secondary | ICD-10-CM | POA: Diagnosis not present

## 2023-07-05 NOTE — Progress Notes (Signed)
Subjective:    Patient ID: Rickey Cochran is a 62 y.o. male.  HPI    Interim history:   Rickey Cochran is a 62 year old right-handed gentleman with an underlying medical history of sinus bradycardia, PACs, osteoarthritis, status post right hip replacement, status post left knee replacement, thyroid cancer with status post surgery, hypothyroidism, hypertension, depression, anxiety, BPH, recent rib fractures in April 2022 with complication of hemothorax, overweight state, paroxysmal A. fib, who presents for follow-up consultation of his obstructive sleep apnea, with status post hypoglossal nerve stimulator implantation in April 2024 and activation in May 2024.  The patient is unaccompanied today.   I last saw him on 04/19/2023, at which time we successfully activated his inspire device.  He had done well thus far and healed well.  He was advised to titrate his stimulation weekly if possible.  Today, 07/05/2023: He reports doing well, tolerates the stimulation, has been able to titrate up to level 10 which equates to 1.6 V.  He does fall asleep fairly quickly especially per wife's feedback, he does have residual snoring on the current level but also falls asleep within minutes, therefore we reduced his start delay from 30 minutes to 20 minutes at this time with the option to reduce it further.  He is compliant with treatment, download shows excellent compliance in fact, average usage of over 8 hours, has not utilized the pause feature, reports that even when he does get up to use the bathroom at night he does not pause treatment, he used his pause button once in the past month according to the download.  Interrogation of the [place] device shows good tongue protrusion, we decided to leave his current level of stimulation at the same. Start delay 20 minutes, pause at 15 minutes, duration of treatment was extended from 9 hours.   The patient's allergies, current medications, family history, past medical  history, past social history, past surgical history and problem list were reviewed and updated as appropriate.    Previously:      I saw him on 06/23/2022, at which time he reported that he had not used his machine secondary to difficulty tolerating it.  He had tried different masks.  In the month of May 2023 he used his machine about 50% of the time above 4 hours.  Residual AHI was mildly elevated around 11/h.  We talked about Inspire treatment and he wanted to pursue this treatment option as an alternative to CPAP therapy.  I made a referral to ENT.     I first met him at the request of his primary care physician on 05/18/2021, at which time the patient reported witnessed apneas as well as snoring and daytime somnolence.  He was advised to proceed with a sleep study.  He had a home sleep test on 06/10/2021 which indicated severe obstructive sleep apnea with a total AHI of 33.2/hour and O2 nadir of 84%, with variable snoring detected.  He was advised to start AutoPap therapy.   He had a follow-up visit with [name], NP on 10/13/2021, at which time he reported difficulty tolerating AutoPap, he had used his machine twice in 45 days.   He had a follow-up appointment with [name], NP on 03/23/2022, at which time he used his machine 12 out of 30 days with percent use days greater than 4 hours at 7% only in the month of March 2023.  He declined a referral for desensitization with DME provider.     05/18/21: (He)  reports snoring and excessive daytime somnolence, as well as witnessed apneas per wife's feedback.  I reviewed your office note from 02/17/2021.  His Epworth sleepiness score is 11 out of 24, fatigue severity score is 42 out of 63.  His wife is a Engineer, civil (consulting) and has encouraged him to get checked for sleep apnea.  His father has sleep apnea.  His father also has a pacemaker.  His mom has A. fib.  Patient reports not being able to stay awake for long in the evening, falls asleep quickly.  He goes to bed  generally between 9 and 10 PM and rise time is generally around 6 AM.  He has nocturia about once or twice per average night but denies recurrent morning headaches.  He lives with his wife, he has a grown daughter and his stepson recently moved out.  They have 2 dogs in the household.  He typically does not watch TV in the bedroom.  He works in Economist.  He has reduced his caffeine intake and limit himself to 2 cups of coffee in the mornings.  He does not drink alcohol and quit smoking in 2007.      His Past Medical History Is Significant For: Past Medical History:  Diagnosis Date   Arthritis    BPH (benign prostatic hyperplasia)    Cancer (HCC)    s/p partial thyroidectomy   Depression    Dysrhythmia    Hypertension    several years ago was on lisiopril but lost weight and no longer needs   Hypothyroidism    mgd on levothyroxine    Left-sided tinnitus    Premature atrial contraction    was seen on EKG at his PCP approx 8 years ago, denies any symptoms, reports he was a runner before onset of hip pain in feb 2020   Sleep apnea    not using CPAP    His Past Surgical History Is Significant For: Past Surgical History:  Procedure Laterality Date   COLONOSCOPY     DRUG INDUCED ENDOSCOPY N/A 10/27/2022   Procedure: DRUG INDUCED SLEEP ENDOSCOPY;  Surgeon: Osborn Coho, MD;  Location: [facility];  Service: ENT;  Laterality: N/A;   IMPLANTATION OF HYPOGLOSSAL NERVE STIMULATOR Right 03/16/2023   Procedure: IMPLANTATION OF HYPOGLOSSAL NERVE STIMULATOR;  Surgeon: Christia Reading, MD;  Location: [facility];  Service: ENT;  Laterality: Right;   IR THORACENTESIS ASP PLEURAL SPACE W/IMG GUIDE  03/20/2021   JOINT REPLACEMENT Right 04/13/2019   right total hip arthroplasty   THYROID LOBECTOMY Right 05/09/2018   THYROID LOBECTOMY Right 05/09/2018   Procedure: RIGHT THYROID LOBECTOMY;  Surgeon: Axel Filler, MD;  Location: [facility] OR;  Service: General;   Laterality: Right;   TOTAL HIP ARTHROPLASTY Right 04/13/2019   Procedure: RIGHT TOTAL HIP ARTHROPLASTY ANTERIOR APPROACH;  Surgeon: Kathryne Hitch, MD;  Location: [facility];  Service: Orthopedics;  Laterality: Right;   TOTAL KNEE ARTHROPLASTY Left 11/12/2021   Procedure: LEFT TOTAL KNEE ARTHROPLASTY;  Surgeon: Cammy Copa, MD;  Location: [facility] OR;  Service: Orthopedics;  Laterality: Left;    His Family History Is Significant For: Family History  Problem Relation Age of Onset   Atrial fibrillation Mother    Arthritis Mother    Sleep apnea Father    Heart Problems Father        has a Visual merchandiser    His Social History Is Significant For: Social History   Socioeconomic History   Marital status: Married  Spouse name: Not on file   Number of children: Not on file   Years of education: Not on file   Highest education level: Associate degree: academic program  Occupational History    Comment: Self employed  Tobacco Use   Smoking status: Former   Smokeless tobacco: Never   Tobacco comments:    QUIT IN 2004  Vaping Use   Vaping status: Never Used  Substance and Sexual Activity   Alcohol use: Not Currently    Alcohol/week: 0.0 standard drinks of alcohol    Comment: quit   Drug use: Never   Sexual activity: Yes  Other Topics Concern   Not on file  Social History Narrative   He and his wife are vegan and very active in hiking, backpacking, cycling.   Social Determinants of Health   Financial Resource Strain: Low Risk  (06/29/2023)   Overall Financial Resource Strain (CARDIA)    Difficulty of Paying Living Expenses: Not hard at all  Food Insecurity: No Food Insecurity (06/29/2023)   Hunger Vital Sign    Worried About Running Out of Food in the Last Year: Never true    Ran Out of Food in the Last Year: Never true  Transportation Needs: No Transportation Needs (06/29/2023)   PRAPARE - Administrator, Civil Service (Medical): No    Lack of Transportation  (Non-Medical): No  Physical Activity: Sufficiently Active (06/29/2023)   Exercise Vital Sign    Days of Exercise per Week: 4 days    Minutes of Exercise per Session: 40 min  Stress: No Stress Concern Present (06/29/2023)   [employer] of Occupational Health - Occupational Stress Questionnaire    Feeling of Stress : Only a little  Social Connections: Moderately Isolated (06/29/2023)   Social Connection and Isolation Panel [NHANES]    Frequency of Communication with Friends and Family: More than three times a week    Frequency of Social Gatherings with Friends and Family: Three times a week    Attends Religious Services: Never    Active Member of Clubs or Organizations: No    Attends Engineer, structural: Not on file    Marital Status: Married    His Allergies Are:  No Known Allergies:   His Current Medications Are:  Outpatient Encounter Medications as of 07/05/2023  Medication Sig   acetaminophen (TYLENOL) 500 MG tablet Take 1,000 mg by mouth as needed (pain).   alfuzosin (UROXATRAL) 10 MG 24 hr tablet Take 1 tablet (10 mg total) by mouth daily with breakfast.   levothyroxine (SYNTHROID) 88 MCG tablet Take 1 tablet (88 mcg total) by mouth every morning on an empty stomach   metoprolol succinate (TOPROL XL) 25 MG 24 hr tablet Take 1 tablet  by mouth daily.   rosuvastatin (CRESTOR) 10 MG tablet Take 1 tablet (10 mg total) by mouth daily.   sertraline (ZOLOFT) 100 MG tablet TAKE 1 TABLET BY MOUTH EVERY NIGHT AT BEDTIME   No facility-administered encounter medications on file as of 07/05/2023.  :  Review of Systems:  Out of a complete 14 point review of systems, all are reviewed and negative with the exception of these symptoms as listed below:  Review of Systems  Neurological:        Pt here for inspire f/u Pt wants ti change time to 15 minutes on inspire  because  falls asleep quickly     Objective:  Neurological Exam  Physical Exam Physical Examination:   Vitals:  07/05/23 0837  BP: (!) 123/55  Pulse: (!) 40    General Examination: The patient is a very pleasant 62 y.o. male in no acute distress. He appears well-developed and well-nourished and well groomed.   HEENT: Normocephalic, atraumatic, pupils are equal, round and reactive to light, tracking is well-preserved, face is symmetric with normal facial animation, speech is clear without dysarthria, hypophonia or voice tremor. Unremarkable thyroidectomy scar, unremarkable and well-healed scar from hypoglossal nerve stimulator electrode placement under the right side of the chin.     Chest: Clear to auscultation without wheezing, rhonchi or crackles noted.   Heart: S1+S2+0, regular and normal without murmurs, rubs or gallops noted.  Bradycardia noted.  Unremarkable generator site.   Abdomen: Soft, non-tender and non-distended.   Extremities: There is no obvious edema in the distal lower extremities bilaterally.    Skin: Warm and dry without trophic changes noted.    Musculoskeletal: exam reveals no obvious joint deformities.    Neurologically:  Mental status: The patient is awake, alert and oriented in all 4 spheres. His immediate and remote memory, attention, language skills and fund of knowledge are appropriate. There is no evidence of aphasia, agnosia, apraxia or anomia. Speech is clear with normal prosody and enunciation. Thought process is linear. Mood is normal and affect is normal.  Cranial nerves II - XII are as described above under HEENT exam.  Motor exam: Normal bulk, moving all 4 extremities, no obvious action or resting tremor.   Fine motor skills and coordination: grossly intact.  Cerebellar testing: No dysmetria or intention tremor. There is no truncal or gait ataxia.  Sensory exam: intact to light touch.    Assessment and Plan:  In summary, AVID SOLLIE is a very pleasant 62 year old right-handed gentleman with an underlying medical history of sinus bradycardia, PACs,  osteoarthritis, status post right hip replacement, status post left knee replacement, thyroid cancer with status post surgery, hypothyroidism, hypertension, depression, anxiety, BPH, recent rib fractures in April 2022 with complication of hemothorax, overweight state, paroxysmal A. fib, who presents for follow-up consultation of his obstructive sleep apnea, PAP intolerance and status post hypoglossal nerve stimulator implantation on 03/16/23.  He had his activation appointment on 04/19/23.  He had a baseline polysomnogram on 01/04/2023 which showed moderate to severe obstructive sleep apnea, with a total AHI of 21.8/hour, REM AHI of 5.5/hour, supine AHI of 81.8/hour and O2 nadir of 66%.  There was no supine REM sleep achieved during the study.  He saw [name] on 09/07/2022 for initial consultation.  He had a subsequent drug-induced sleep endoscopy (DISE) on 10/27/2022 with Dr. [name].  He transferred care to [name] after Dr. Thurmon Fair retirement, and had an appointment with [name] on 03/10/2023 and had his hypoglossal nerve stimulator implanted on 03/16/2023.  He is currently on level 10, which equates to 1.6 V.  He has overall done well thus far, has had no trouble with titrating up the settings.  We decided to keep his current level of stimulation the same but reduced his start delay to 20 minutes at this time with the option to reducing it further to 10 or 15 minutes at the time of his sleep study for inspire titration which is coming up in the first week of September.  He will have a follow-up appointment after his titration study in mid September at which time we will make any necessary changes reflecting findings from his titration sleep study.  He is commended for his  excellent treatment adherence.  I answered all his questions today and he was in agreement with our plan.  I spent 30 minutes in total face-to-face time and in reviewing records during pre-charting, more than 50% of  which was spent in counseling and coordination of care, reviewing test results, reviewing medications and treatment regimen and/or in discussing or reviewing the diagnosis of OSA, the prognosis and treatment options. Pertinent laboratory and imaging test results that were available during this visit with the patient were reviewed by me and considered in my medical decision making (see chart for details).

## 2023-07-05 NOTE — Telephone Encounter (Signed)
Patient came in for an inspire visit no changed were made at this visit.

## 2023-07-05 NOTE — Patient Instructions (Signed)
It was nice to see you again today.  As discussed, we will keep your inspire settings the same with the exception of reducing your start delay to 20 minutes.  We can reduce it further at the time of his sleep study if needed.  We have scheduled your inspire titration sleep study for early September and a follow-up for mid September.  Please keep all your appointments and always bring your remote control.  We will plan to make any necessary treatment changes at the next follow-up appointment.

## 2023-07-08 ENCOUNTER — Ambulatory Visit: Admission: RE | Admit: 2023-07-08 | Payer: 59 | Source: Ambulatory Visit

## 2023-07-08 DIAGNOSIS — C73 Malignant neoplasm of thyroid gland: Secondary | ICD-10-CM

## 2023-07-08 DIAGNOSIS — Z9889 Other specified postprocedural states: Secondary | ICD-10-CM | POA: Diagnosis not present

## 2023-07-08 DIAGNOSIS — Z85850 Personal history of malignant neoplasm of thyroid: Secondary | ICD-10-CM | POA: Diagnosis not present

## 2023-07-08 DIAGNOSIS — E041 Nontoxic single thyroid nodule: Secondary | ICD-10-CM

## 2023-07-26 ENCOUNTER — Ambulatory Visit (INDEPENDENT_AMBULATORY_CARE_PROVIDER_SITE_OTHER): Payer: 59 | Admitting: Neurology

## 2023-07-26 DIAGNOSIS — Z789 Other specified health status: Secondary | ICD-10-CM

## 2023-07-26 DIAGNOSIS — G4733 Obstructive sleep apnea (adult) (pediatric): Secondary | ICD-10-CM | POA: Diagnosis not present

## 2023-07-26 DIAGNOSIS — G4761 Periodic limb movement disorder: Secondary | ICD-10-CM

## 2023-07-26 DIAGNOSIS — G4720 Circadian rhythm sleep disorder, unspecified type: Secondary | ICD-10-CM

## 2023-07-26 DIAGNOSIS — R9431 Abnormal electrocardiogram [ECG] [EKG]: Secondary | ICD-10-CM

## 2023-07-26 DIAGNOSIS — Z9682 Presence of neurostimulator: Secondary | ICD-10-CM

## 2023-07-27 ENCOUNTER — Other Ambulatory Visit: Payer: 59

## 2023-07-28 NOTE — Procedures (Signed)
Physician Interpretation:     [facility] PAP TITRATION INTERPRETATION REPORT   STUDY DATE: 07/26/2023      PATIENT NAME:  Rickey Cochran         DATE OF BIRTH:  [birth year]  PATIENT ID:  [id number]    TYPE OF STUDY:  PSG  READING PHYSICIAN: Huston Foley, MD, PhD SCORING TECHNICIAN: Domingo Cocking, RPSGT     Referred by: Etta Grandchild, MD  ? History and Indication for Testing: 62 year old right-handed gentleman with an underlying medical history of sinus bradycardia, PACs, osteoarthritis, status post right hip replacement, status post left knee replacement, thyroid cancer with status post surgery, hypothyroidism, hypertension, depression, anxiety, BPH, recent rib fractures in April 2022 with complication of hemothorax, overweight state, paroxysmal A. fib, who presents for a full night inspire titration study. He is status post hypoglossal nerve stimulator placement in April 2024 and subsequent activation in May 2024. Incoming amplitude is 1.6 V.  ADDITIONAL INFORMATION:  Height: 74.0 in Weight: 217 lb (BMI 27) Neck Size: 0.0 in    MEDICATIONS: Acetaminophen, Uroxatral, Advil, Synthroid, Toprol XL, Multivitamins, Crestor, Zoloft  DESCRIPTION: A sleep technologist was in attendance for the duration of the recording.  Data collection, scoring, video monitoring, and reporting were performed in compliance with the AASM Manual for the Scoring of Sleep and Associated Events; (Hypopnea is scored based on the criteria listed in Section VIII D. 1b in the AASM Manual V2.6 using a 4% oxygen desaturation rule or Hypopnea is scored based on the criteria listed in Section VIII D. 1a in the AASM Manual V2.6 using 3% oxygen desaturation and /or arousal rule).  A physician certified by the American Board of Sleep Medicine reviewed each epoch of the study.  SLEEP CONTINUITY AND SLEEP ARCHITECTURE:  Lights off was at 21:21: and lights on 05:01: (7.7 hours in bed). Total sleep  time was 382.5 minutes (3.9% supine;  96.1% lateral;  0.0% prone, 14.6% REM sleep), with a good  sleep efficiency at 83.0%. Sleep latency was normal at 12.5 minutes, REM latency was normal at 93.5 min.  Of the total sleep time, the percentage of stage N1 sleep was 7.5%, stage N2 sleep was 76.9%, which was increased, stage N3 sleep was 1.0%, and REM sleep was 14.6%, which is reduced. Wake after sleep onset (WASO) time accounted for 65.5 minutes, with minimal to mild sleep fragmentation noted.   AROUSAL: There were 39 arousals in total, for an arousal index of 6.1 arousals/hour.  Of these, 23 were identified as respiratory-related arousals (3.6 /h), 2 were PLM-related arousals (0.3 /h), and 18 were non-specific arousals (2.8 /h)  RESPIRATORY MONITORING:  Based on CMS criteria (using a 4% oxygen desaturation rule for scoring hypopneas), there were 9 apneas (5 obstructive; 3 central; 1 mixed), and 30 hypopneas.  Apnea index was 1.4. Hypopnea index was 4.7. The apnea-hypopnea index was 6.1 overall (16.0 supine, 3.2 non-supine; 3.2 REM, 0.0 supine REM). There were 0 respiratory effort-related arousals (RERAs).  The RERA index was 0.0 events/h. Total respiratory disturbance index (RDI) was 6.1 events/h. RDI results showed: supine RDI  16.0 /h; non-supine RDI 5.7 /h; REM RDI 3.2 /h, supine REM RDI 0.0 /h.   Based on AASM criteria (using a 3% oxygen desaturation and /or arousal rule for scoring hypopneas), there were 9 apneas (5 obstructive; 3 central; 1 mixed), and 63 hypopneas. Apnea index was 1.4. Hypopnea index was 9.9. The apnea-hypopnea index was 11.3 overall (24.0 supine, 5.4 non-supine; 5.4  REM, 0.0 supine REM). There were 0 respiratory effort-related arousals (RERAs).  The RERA index was 0.0 events/h. Total respiratory disturbance index (RDI) was 11.3 events/h. RDI results showed: supine RDI  24.0 /h; non-supine RDI 10.8 /h; REM RDI 5.4 /h, supine REM RDI 0.0 /h.  Respiratory events were associated with  oxyhemoglobin desaturations (nadir during sleep 88%) from a mean of 96%).   OXIMETRY: Total sleep time spent at, or below 88% was 0.0 minutes, or 0.0% of total sleep time.   BODY POSITION: Duration of total sleep and percent of total sleep in their respective position is as follows: supine 15 minutes (3.9%), non-supine 367.5 minutes (96.1%); right 194 minutes (50.7%), left 173 minutes (45.4%), and prone 00 minutes (0.0%). Total supine REM sleep time was 00 minutes (0.0% of total REM sleep).   LIMB MOVEMENTS: There were 214 periodic limb movements of sleep (33.6/h), of which 2 (0.3/h) were associated with an arousal.   TITRATION DETAILS (SEE ALSO TABLE AT THE END OF THE REPORT):   The patient was started on 1.4 V about 20 minutes after persistent  sleep. In 0.1 V increments, he was titrated to a final voltage of 1.7 V. He achieved a TST of 247 minutes on this level with a residual AHI of 10.0/hour, worse in supine sleep. He achieved non-supine REM sleep on this setting and O2 nadir of was 88% during non-supine NREM sleep. At the end of the study, he was programmed back to the incoming setting of 1.6 V.    EEG: Review of the EEG showed no abnormal electrical discharges and symmetrical bihemispheric findings.    EKG: The EKG revealed frequent PVCs and bradycardia. The maximum heart rate during sleep was 90 bpm.   AUDIO/VIDEO REVIEW: The audio and video review did not show any abnormal or unusual behaviors, movements, phonations or vocalizations. The patient took 2 restroom breaks. Snoring was noted, but improved with Inspire.   POST-STUDY QUESTIONNAIRE: Post study, the patient indicated, that sleep was worse than usual.    IMPRESSION:   1. Obstructive Sleep Apnea (OSA) 2. S/P hypoglossal nerve stimulator placement 3. Dysfunctions associated with sleep stages or arousal from sleep 4. Non-specific abnormal electrocardiogram (EKG) 5. PLMD (periodic limb movements of sleep)  RECOMMENDATIONS:    1. This Inspire titration study demonstrates improvement of the patient's obstructive sleep apnea.  The patient has a history of CPAP intolerance.  Incoming amplitude was 1.6 V. Patient was titrated to a final voltage of 1.7 V during the study. He had mild residual OSA on the final setting, worse during supine sleep. I would favor an increased voltage of 1.8 for home therapeutic use. This will be discussed during the upcoming follow up appointment. The patient will be advised to continue to use the [facility] device regularly.  2. Please note, that untreated obstructive sleep apnea may carry additional perioperative morbidity. Patients with significant obstructive sleep apnea should inform their surgeons and anesthesiologists about their condition and their current treatment modality (positive airway pressure treatment versus Inspire).  3. This study shows some sleep fragmentation and abnormal sleep stage percentages; these are nonspecific findings and per se do not signify an intrinsic sleep disorder or a cause for the patient's sleep-related symptoms. Causes include (but are not limited to) the first night effect of the sleep study, circadian rhythm disturbances, medication effect or an underlying mood disorder or medical problem.  4. The study showed frequent PVCs on single lead EKG; clinical correlation is recommended. The patient is well-known  to cardiology. 5. Mild PLMs (periodic limb movements of sleep) were noted during this study with no significant associated arousals; clinical correlation is recommended. Medication effect from the antidepressant medication should be considered.  6. The patient should be cautioned not to drive, work at heights, or operate dangerous or heavy equipment when tired or sleepy. Review and reiteration of good sleep hygiene measures should be pursued with any patient. 7. The patient will be seen in follow-up in the sleep clinic at [facility] for discussion of the test results,  symptom and treatment compliance review, further management strategies, etc. The referring provider will be notified of the test results.  I certify that I have reviewed the entire raw data recording prior to the issuance of this report in accordance with the Standards of Accreditation of the American Academy of Sleep Medicine (AASM).  Huston Foley, MD, PhD Medical Director, [facility]) Diplomat, ABPN (Neurology and Sleep)               Technical Report:   [facility] CPAP Summary    General Information  Name: Abdulrhman, Avino BMI: 27.86 Physician: Huston Foley, MD  ID: [id number] Height: 74.0 in Technician: Domingo Cocking, RPSGT  Sex: Male Weight: 217.0 lb Record: [id number]  Age: 26 [September 16, 1961] Date: 07/26/2023     Medical & Medication History    Mr. Zurick is a 62 year old right-handed gentleman with an underlying medical history of sinus bradycardia, PACs, osteoarthritis, status post right hip replacement, status post left knee replacement, thyroid cancer with status post surgery, hypothyroidism, hypertension, depression, anxiety, BPH, recent rib fractures in April 2022 with complication of hemothorax, overweight state, paroxysmal A. fib, who presents for follow-up consultation of his obstructive sleep apnea, with status post hypoglossal nerve stimulator implantation in April 2024 and activation in May 2024.  Acetaminophen, Uroxatral, Advil, Synthroid, Toprol XL, Multivitamins, Crestor, Zoloft   Sleep Disorder      Comments   Patient arrived for his Inspire Fine Tune Titration. Procedure explained and all questions answered. Standard paste setup without complications. Patient slept supine, left, and right. Mild to moderate snoring heard. Respiratory events observed, worse while supine. After 20 plus minutes of persistent sleep, Inspire was started at 1.4 volts. Inspire voltage was increased to  1.7 volts in an effort to control obstructive respiratory events and abolish snoring. At 1.7 volts, a seemingly therapeutic voltage, AHI = 3.2. Cardiac arrhythmias observed. Patient has a known cardiac history. No significant PLMS observed. 2 bathroom visits.     CPAP start time: 10:01:02 PM CPAP end time: 05:01:52 AM   Time Total Supine Side Prone Upright  Recording (TRT) 7h 1.81m 0h 24.31m 6h 36.50m 0h 0.13m 0h 0.63m  Sleep (TST) 5h 55.76m 0h 1.64m 5h 54.52m 0h 0.26m 0h 0.48m   Latency N1 N2 N3 REM Onset Per. Slp. Eff.  Actual 0h 36.58m 0h 0.47m 1h 4.25m 1h 33.35m 0h 0.58m 0h 0.11m 84.44%   Stg Dur Wake N1 N2 N3 REM  Total 65.5 25.5 270.0 4.0 56.0  Supine 23.0 1.5 0.0 0.0 0.0  Side 42.5 24.0 270.0 4.0 56.0  Prone 0.0 0.0 0.0 0.0 0.0  Upright 0.0 0.0 0.0 0.0 0.0   Stg % Wake N1 N2 N3 REM  Total 15.6 7.2 75.9 1.1 15.8  Supine 5.5 0.4 0.0 0.0 0.0  Side 10.1 6.8 75.9 1.1 15.8  Prone 0.0 0.0 0.0 0.0 0.0  Upright 0.0 0.0 0.0 0.0 0.0     Apnea  Summary Sub Supine Side Prone Upright  Total 9 Total 9 3 6  0 0    REM 0 0 0 0 0    NREM 9 3 6  0 0  Obs 5 REM 0 0 0 0 0    NREM 5 3 2  0 0  Mix 1 REM 0 0 0 0 0    NREM 1 0 1 0 0  Cen 3 REM 0 0 0 0 0    NREM 3 0 3 0 0   Rera Summary Sub Supine Side Prone Upright  Total 0 Total 0 0 0 0 0    REM 0 0 0 0 0    NREM 0 0 0 0 0   Hypopnea Summary Sub Supine Side Prone Upright  Total 56 Total 56 0 56 0 0    REM 5 0 5 0 0    NREM 51 0 51 0 0   4% Hypopnea Summary Sub Supine Side Prone Upright  Total (4%) 27 Total 27 0 27 0 0    REM 3 0 3 0 0    NREM 24 0 24 0 0     AHI Total Obs Mix Cen  10.97 Apnea 1.52 0.84 0.17 0.51   Hypopnea 9.45 -- -- --  6.08 Hypopnea (4%) 4.56 -- -- --    Total Supine Side Prone Upright  Position AHI 10.97 120.00 10.51 0.00 0.00  REM AHI 5.36   NREM AHI 12.02   Position RDI 10.97 120.00 10.51 0.00 0.00  REM RDI 5.36   NREM RDI 12.02    4% Hypopnea Total Supine Side Prone Upright  Position AHI (4%) 6.08 120.00 5.59 0.00  0.00  REM AHI (4%) 3.21   NREM AHI (4%) 6.61   Position RDI (4%) 6.08 120.00 5.59 0.00 0.00  REM RDI (4%) 3.21   NREM RDI (4%) 6.61    Desaturation Information  <100% <90% <80% <70% <60% <50% <40%  Supine 18 5 0 0 0 0 0  Side 102 6 0 0 0 0 0  Prone 0 0 0 0 0 0 0  Upright 0 0 0 0 0 0 0  Total 120 11 0 0 0 0 0  Desaturation threshold setting: 3% Minimum desaturation setting: 10 seconds SaO2 nadir: 79% The longest event was a 40 sec obstructive Hypopnea with a minimum SaO2 of 93%. The lowest SaO2 was 83% associated with a 23 sec obstructive Apnea. EKG Rates EKG Avg Max Min  Awake 71 92 42  Asleep 66 89 37  EKG Events: N/A Awakening/Arousal Information # of Awakenings 13  Wake after sleep onset 65.28m  Wake after persistent sleep 65.6m   Arousal Assoc. Arousals Index  Apneas 6 1.0  Hypopneas 14 2.4  Leg Movements 6 1.0  Snore 0.0 0.0  PTT Arousals 0 0.0  Spontaneous 18 3.0  Total 43 7.3  Myoclonus Information PLMS LMs Index  Total LMs during PLMS 137 23.1  LMs w/ Microarousals 2 0.3   LM LMs Index  w/ Microarousal 4 0.7  w/ Awakening 2 0.3  w/ Resp Event 0 0.0  Spontaneous 23 3.9  Total 27 4.6      Titration Table:  [facility] CPAP/Bilevel Report    General Information  Name: Jermanie, Carnal BMI: 27 Physician: Huston Foley, MD  ID: [id number] Height: 74 in Technician: Domingo Cocking  Sex: Male Weight: 217 lb Record: [id number]  Age: 69 [December 10, 1960] Date: 07/26/2023 Scorer: Domingo Cocking  Recommended Settings IPAP: N/A cmH20 EPAP: N/A cmH2O AHI: N/A AHI (4%): N/A   Volt IPAP/EPAP 00 1.4 1.5 1.6 1.7   O2 Vol 0.0 0.0 0.0 0.0 0.0  Time TRT 61.10m 58.91m 44.12m 30.18m 266.14m   TST 28.50m 43.54m 43.37m 20.42m 247.24m  Sleep Stage % Wake 53.7 25.6 1.1 34.4 7.3   % REM 0.0 0.0 20.7 25.0 17.0   % N1 15.8 13.8 5.7 5.0 5.9   % N2 84.2 86.2 64.4 70.0 77.1   % N3 0.0 0.0 9.2 0.0 0.0  Respiratory Total Events 9 5 8 9  41   Obs.  Apn. 1 0 0 0 4   Mixed Apn. 0 0 0 0 1   Cen. Apn. 0 0 0 0 3   Hypopneas 8 5 8 9  33   AHI 18.95 6.90 11.03 27.00 9.96   Supine AHI 17.14 0.00 0.00 0.00 120.00   Prone AHI 0.00 0.00 0.00 0.00 0.00   Side AHI 20.69 6.90 11.03 27.00 9.51  Respiratory (4%) Hypopneas (4%) 4.00 1.00 3.00 2.00 20.00   AHI (4%) 10.53 1.38 4.14 6.00 6.80   Supine AHI (4%) 8.57 0.00 0.00 0.00 120.00   Prone AHI (4%) 0.00 0.00 0.00 0.00 0.00   Side AHI (4%) 12.41 1.38 4.14 6.00 6.34  Desat Profile <= 90% 4.45m 4.19m 0.41m 0.46m 1.84m   <= 80% 3.34m 3.55m 0.54m 0.69m 0.89m   <= 70% 3.36m 3.71m 0.21m 0.46m 0.64m   <= 60% 3.25m 3.28m 0.66m 0.22m 0.78m  Arousal Index Apnea 2.1 0.0 0.0 0.0 1.2   Hypopnea 6.3 2.8 6.9 12.0 0.7   LM 2.1 1.4 1.4 3.0 0.7   Spontaneous 0.0 8.3 4.1 0.0 2.2

## 2023-08-09 ENCOUNTER — Telehealth: Payer: Self-pay | Admitting: Neurology

## 2023-08-09 ENCOUNTER — Encounter: Payer: Self-pay | Admitting: Neurology

## 2023-08-09 ENCOUNTER — Ambulatory Visit: Payer: 59 | Admitting: Neurology

## 2023-08-09 VITALS — BP 121/66 | HR 39 | Ht 74.0 in | Wt 216.0 lb

## 2023-08-09 DIAGNOSIS — R001 Bradycardia, unspecified: Secondary | ICD-10-CM

## 2023-08-09 DIAGNOSIS — Z789 Other specified health status: Secondary | ICD-10-CM

## 2023-08-09 DIAGNOSIS — G4733 Obstructive sleep apnea (adult) (pediatric): Secondary | ICD-10-CM

## 2023-08-09 DIAGNOSIS — Z9682 Presence of neurostimulator: Secondary | ICD-10-CM | POA: Diagnosis not present

## 2023-08-09 NOTE — Progress Notes (Signed)
Subjective:    Patient ID: Rickey Cochran is a 62 y.o. male.  HPI    Interim history:   Rickey Cochran is a 62 year old right-handed gentleman with an underlying medical history of sinus bradycardia, PACs, osteoarthritis, status post right hip replacement, status post left knee replacement, thyroid cancer with status post surgery, hypothyroidism, hypertension, depression, anxiety, BPH, recent rib fractures in April 2022 with complication of hemothorax, overweight state, paroxysmal A. fib, who presents for follow-up consultation of his obstructive sleep apnea, with status post hypoglossal nerve stimulator implantation in April 2024, activation in May 2024 and recent Inspire titration sleep study.  The patient is unaccompanied today. I last saw him on 07/05/2023, at which time he was doing well.  He was compliant with treatment and tolerating it.  He had good tongue protrusion on a treatment level of 1.6 V.  He was advised to proceed with a formal titration study with inspire.  He had a laboratory attended inspire titration study on 07/26/2023 which showed a sleep efficiency of 83%, sleep latency 12.5 minutes, REM latency normal at 93.5 minutes.  He had an increased percentage of stage II sleep and a decreased percentage of REM sleep.  He was titrated on inspire from 1.4 V to 1.7 V.  He achieved fairly good apnea control on 1.7 V, residual AHI was 10/h, worse during supine sleep.  O2 nadir was 88%.  Today, 08/09/2023: He reports doing well, tolerating the inspire, uses it every night, no new concerns.  He does endorse tiredness especially towards the evening, difficulty staying up till bedtime.  He takes his sertraline right at bedtime.  He still takes a low-dose Toprol but has significant bradycardia at times, currently around 40 bpm.  He has an appointment with his cardiologist next month.  I reviewed his inspire compliance data.  In the last 90 days he uses inspire device every night for an average of 8  hours and 35 minutes, 99% of the time he reached more than 4 hours.  Start delay is at 20 minutes, pause at 15 minutes but he has not utilized the pause button.  He is incoming voltage is 1.6.  We trialed him on 1.6 V and 1.7 V today, he had good tongue protrusion and tolerance of 1.7 V and we reset his level to 1.7 V which is level 3, giving him an option to go to levels up in 2 levels down for total of 5 levels available on his remote.  If he tolerates the 1.7 V, he is encouraged to stay on it.   The patient's allergies, current medications, family history, past medical history, past social history, past surgical history and problem list were reviewed and updated as appropriate.    Previously:      I saw him on 04/19/2023, at which time we successfully activated his inspire device.  He had done well thus far and healed well.  He was advised to titrate his stimulation weekly if possible.    I saw him on 06/23/2022, at which time he reported that he had not used his machine secondary to difficulty tolerating it.  He had tried different masks.  In the month of May 2023 he used his machine about 50% of the time above 4 hours.  Residual AHI was mildly elevated around 11/h.  We talked about Inspire treatment and he wanted to pursue this treatment option as an alternative to CPAP therapy.  I made a referral to ENT.  I first met him at the request of his primary care physician on 05/18/2021, at which time the patient reported witnessed apneas as well as snoring and daytime somnolence.  He was advised to proceed with a sleep study.  He had a home sleep test on 06/10/2021 which indicated severe obstructive sleep apnea with a total AHI of 33.2/hour and O2 nadir of 84%, with variable snoring detected.  He was advised to start AutoPap therapy.   He had a follow-up visit with [name], NP on 10/13/2021, at which time he reported difficulty tolerating AutoPap, he had used his machine twice in 45 days.   He had a  follow-up appointment with [name], NP on 03/23/2022, at which time he used his machine 12 out of 30 days with percent use days greater than 4 hours at 7% only in the month of March 2023.  He declined a referral for desensitization with DME provider.     05/18/21: (He) reports snoring and excessive daytime somnolence, as well as witnessed apneas per wife's feedback.  I reviewed your office note from 02/17/2021.  His Epworth sleepiness score is 11 out of 24, fatigue severity score is 42 out of 63.  His wife is a Engineer, civil (consulting) and has encouraged him to get checked for sleep apnea.  His father has sleep apnea.  His father also has a pacemaker.  His mom has A. fib.  Patient reports not being able to stay awake for long in the evening, falls asleep quickly.  He goes to bed generally between 9 and 10 PM and rise time is generally around 6 AM.  He has nocturia about once or twice per average night but denies recurrent morning headaches.  He lives with his wife, he has a grown daughter and his stepson recently moved out.  They have 2 dogs in the household.  He typically does not watch TV in the bedroom.  He works in Economist.  He has reduced his caffeine intake and limit himself to 2 cups of coffee in the mornings.  He does not drink alcohol and quit smoking in 2007.         His Past Medical History Is Significant For: Past Medical History:  Diagnosis Date   Arthritis    BPH (benign prostatic hyperplasia)    Cancer (HCC)    s/p partial thyroidectomy   Depression    Dysrhythmia    Hypertension    several years ago was on lisiopril but lost weight and no longer needs   Hypothyroidism    mgd on levothyroxine    Left-sided tinnitus    Premature atrial contraction    was seen on EKG at his PCP approx 8 years ago, denies any symptoms, reports he was a runner before onset of hip pain in feb 2020   Sleep apnea    not using CPAP    His Past Surgical History Is Significant For: Past Surgical History:   Procedure Laterality Date   COLONOSCOPY     DRUG INDUCED ENDOSCOPY N/A 10/27/2022   Procedure: DRUG INDUCED SLEEP ENDOSCOPY;  Surgeon: Osborn Coho, MD;  Location: [facility];  Service: ENT;  Laterality: N/A;   IMPLANTATION OF HYPOGLOSSAL NERVE STIMULATOR Right 03/16/2023   Procedure: IMPLANTATION OF HYPOGLOSSAL NERVE STIMULATOR;  Surgeon: Christia Reading, MD;  Location: [facility];  Service: ENT;  Laterality: Right;   IR THORACENTESIS ASP PLEURAL SPACE W/IMG GUIDE  03/20/2021   JOINT REPLACEMENT Right 04/13/2019   right  total hip arthroplasty   THYROID LOBECTOMY Right 05/09/2018   THYROID LOBECTOMY Right 05/09/2018   Procedure: RIGHT THYROID LOBECTOMY;  Surgeon: Axel Filler, MD;  Location: [facility] OR;  Service: General;  Laterality: Right;   TOTAL HIP ARTHROPLASTY Right 04/13/2019   Procedure: RIGHT TOTAL HIP ARTHROPLASTY ANTERIOR APPROACH;  Surgeon: Kathryne Hitch, MD;  Location: [facility];  Service: Orthopedics;  Laterality: Right;   TOTAL KNEE ARTHROPLASTY Left 11/12/2021   Procedure: LEFT TOTAL KNEE ARTHROPLASTY;  Surgeon: Cammy Copa, MD;  Location: [facility];  Service: Orthopedics;  Laterality: Left;    His Family History Is Significant For: Family History  Problem Relation Age of Onset   Atrial fibrillation Mother    Arthritis Mother    Sleep apnea Father    Heart Problems Father        has a Visual merchandiser    His Social History Is Significant For: Social History   Socioeconomic History   Marital status: Married    Spouse name: Not on file   Number of children: Not on file   Years of education: Not on file   Highest education level: Associate degree: academic program  Occupational History    Comment: Self employed  Tobacco Use   Smoking status: Former   Smokeless tobacco: Never   Tobacco comments:    QUIT IN 2004  Vaping Use   Vaping status: Never Used  Substance and Sexual Activity   Alcohol use: Not Currently     Alcohol/week: 0.0 standard drinks of alcohol    Comment: quit   Drug use: Never   Sexual activity: Yes  Other Topics Concern   Not on file  Social History Narrative   He and his wife are vegan and very active in hiking, backpacking, cycling.   Social Determinants of Health   Financial Resource Strain: Low Risk  (06/29/2023)   Overall Financial Resource Strain (CARDIA)    Difficulty of Paying Living Expenses: Not hard at all  Food Insecurity: No Food Insecurity (06/29/2023)   Hunger Vital Sign    Worried About Running Out of Food in the Last Year: Never true    Ran Out of Food in the Last Year: Never true  Transportation Needs: No Transportation Needs (06/29/2023)   PRAPARE - Administrator, Civil Service (Medical): No    Lack of Transportation (Non-Medical): No  Physical Activity: Sufficiently Active (06/29/2023)   Exercise Vital Sign    Days of Exercise per Week: 4 days    Minutes of Exercise per Session: 40 min  Stress: No Stress Concern Present (06/29/2023)   [employer] of Occupational Health - Occupational Stress Questionnaire    Feeling of Stress : Only a little  Social Connections: Moderately Isolated (06/29/2023)   Social Connection and Isolation Panel [NHANES]    Frequency of Communication with Friends and Family: More than three times a week    Frequency of Social Gatherings with Friends and Family: Three times a week    Attends Religious Services: Never    Active Member of Clubs or Organizations: No    Attends Engineer, structural: Not on file    Marital Status: Married    His Allergies Are:  No Known Allergies:   His Current Medications Are:  Outpatient Encounter Medications as of 08/09/2023  Medication Sig   acetaminophen (TYLENOL) 500 MG tablet Take 1,000 mg by mouth as needed (pain).   alfuzosin (UROXATRAL) 10 MG 24 hr tablet Take 1 tablet (10  mg total) by mouth daily with breakfast.   levothyroxine (SYNTHROID) 88 MCG tablet Take 1 tablet (88  mcg total) by mouth every morning on an empty stomach   metoprolol succinate (TOPROL XL) 25 MG 24 hr tablet Take 1 tablet  by mouth daily.   rosuvastatin (CRESTOR) 10 MG tablet Take 1 tablet (10 mg total) by mouth daily.   sertraline (ZOLOFT) 100 MG tablet TAKE 1 TABLET BY MOUTH EVERY NIGHT AT BEDTIME   No facility-administered encounter medications on file as of 08/09/2023.  :  Review of Systems:  Out of a complete 14 point review of systems, all are reviewed and negative with the exception of these symptoms as listed below:  Review of Systems  Neurological:        Pt here for inspire f/u   ESS:8    Objective:  Neurological Exam  Physical Exam Physical Examination:   Vitals:   08/09/23 0830  BP: 121/66  Pulse: (!) 39   General Examination: The patient is a very pleasant 62 y.o. male in no acute distress. He appears well-developed and well-nourished and well groomed.   HEENT: Normocephalic, atraumatic, pupils are equal, round and reactive, tracking well-preserved, face is symmetric.  Unremarkable scar under right chin, unremarkable thyroidectomy scar.  Tongue protrudes centrally and palate elevates symmetrically.  Upon turning the inspire unit on he has good tolerance of the stimulation.  No tongue deviation.     Chest: Clear to auscultation without wheezing, rhonchi or crackles noted.   Heart: S1+S2+0, regular and normal without murmurs, rubs or gallops noted.  Bradycardia noted.   Abdomen: Soft, non-tender and non-distended.   Extremities: There is no obvious edema in the distal lower extremities bilaterally.    Skin: Warm and dry without trophic changes noted.    Musculoskeletal: exam reveals no obvious joint deformities.    Neurologically:  Mental status: The patient is awake, alert and oriented in all 4 spheres. His immediate and remote memory, attention, language skills and fund of knowledge are appropriate. There is no evidence of aphasia, agnosia, apraxia or  anomia. Speech is clear with normal prosody and enunciation. Thought process is linear. Mood is normal and affect is normal.  Cranial nerves II - XII are as described above under HEENT exam.  Motor exam: Normal bulk, moving all 4 extremities, no obvious action or resting tremor.   Fine motor skills and coordination: grossly intact.  Cerebellar testing: No dysmetria or intention tremor. There is no truncal or gait ataxia.  Sensory exam: intact to light touch.    Assessment and Plan:  In summary, Rickey Cochran is a very pleasant 62 year old right-handed gentleman with an underlying medical history of sinus bradycardia, PACs, osteoarthritis, status post right hip replacement, status post left knee replacement, thyroid cancer with status post surgery, hypothyroidism, hypertension, depression, anxiety, BPH, recent rib fractures in April 2022 with complication of hemothorax, overweight state, paroxysmal A. fib, who presents for follow-up consultation of his obstructive sleep apnea, PAP intolerance and status post hypoglossal nerve stimulator implantation.  He had his activation appointment on 04/19/2023.  He had a baseline polysomnogram on 01/04/2023 which showed moderate to severe obstructive sleep apnea, with a total AHI of 21.8/hour, REM AHI of 5.5/hour, supine AHI of 81.8/hour and O2 nadir of 66%.  There was no supine REM sleep achieved during the study.  He saw [name] on 09/07/2022 for initial consultation.   He had a subsequent drug-induced sleep endoscopy (DISE) on 10/27/2022 with Dr. [name].  He transferred care to [name] and had an appointment with [name] on 03/10/2023 and had his hypoglossal nerve stimulator implanted on 03/16/2023.6 His activation went well.  He had an inspire titration study on 07/26/2023 and did well on 1.7 V, 1 level up from his incoming stimulation.  He is advised to stay at 1.7 V which we reprogrammed to level 3 today, allowing him to go to levels up  into levels down if need be.  He is advised to follow-up with his cardiologist, he is quite bradycardic, he endorses daytime tiredness which in part could be secondary to medication effect, including his sertraline and Toprol and also due to bradycardia.  He had mild PLM's during the sleep study but does not endorse any restless leg symptoms.  PLM's may also be secondary to his sertraline.  He is advised to follow-up with one of our nurse practitioners routinely in 6 to 8 months, at which time we will do another compliance download and see how he is doing.  We may consider home sleep test just for recheck as to how his apnea control is at home.  I answered all his questions today and he was in agreement with our plan.  I spent 30 minutes in total face-to-face time and in reviewing records during pre-charting, more than 50% of which was spent in counseling and coordination of care, reviewing test results, reviewing medications and treatment regimen and/or in discussing or reviewing the diagnosis of OSA on inspire, the prognosis and treatment options. Pertinent laboratory and imaging test results that were available during this visit with the patient were reviewed by me and considered in my medical decision making (see chart for details).

## 2023-08-09 NOTE — Patient Instructions (Signed)
It was nice to see you again today.  I am glad you are doing well with your inspire device. We have reset your remote to level 3 equaling 1.7 V, you can stay on this level if you can tolerate it.  You did well on it during the sleep study.  You have the option of going to levels up for 2 levels down if the need arises. Please talk to your cardiologist about ongoing low heart rate which we call bradycardia.  This can cause you to feel tired, certain medications including Toprol and sertraline can also cause tiredness. Please follow-up in 6 to 8 months to see one of our nurse practitioners.

## 2023-08-09 NOTE — Telephone Encounter (Signed)
Patient saw Dr. Frances Furbish for a inspire follow up after his Sleep study.

## 2023-08-10 ENCOUNTER — Other Ambulatory Visit (HOSPITAL_COMMUNITY): Payer: Self-pay

## 2023-08-22 ENCOUNTER — Other Ambulatory Visit (HOSPITAL_COMMUNITY): Payer: Self-pay

## 2023-08-22 ENCOUNTER — Other Ambulatory Visit: Payer: Self-pay

## 2023-08-22 ENCOUNTER — Other Ambulatory Visit: Payer: Self-pay | Admitting: Internal Medicine

## 2023-08-22 MED ORDER — LEVOTHYROXINE SODIUM 88 MCG PO TABS
88.0000 ug | ORAL_TABLET | Freq: Every morning | ORAL | 1 refills | Status: DC
  Filled 2023-08-22: qty 90, 90d supply, fill #0
  Filled 2023-12-05: qty 90, 90d supply, fill #1

## 2023-08-25 ENCOUNTER — Other Ambulatory Visit (HOSPITAL_COMMUNITY): Payer: Self-pay

## 2023-09-19 ENCOUNTER — Other Ambulatory Visit (HOSPITAL_COMMUNITY): Payer: Self-pay

## 2023-09-19 DIAGNOSIS — L814 Other melanin hyperpigmentation: Secondary | ICD-10-CM | POA: Diagnosis not present

## 2023-09-19 DIAGNOSIS — D225 Melanocytic nevi of trunk: Secondary | ICD-10-CM | POA: Diagnosis not present

## 2023-09-19 DIAGNOSIS — L821 Other seborrheic keratosis: Secondary | ICD-10-CM | POA: Diagnosis not present

## 2023-09-22 ENCOUNTER — Other Ambulatory Visit (HOSPITAL_COMMUNITY): Payer: Self-pay

## 2023-09-22 ENCOUNTER — Ambulatory Visit: Payer: 59 | Attending: Internal Medicine | Admitting: Internal Medicine

## 2023-09-22 ENCOUNTER — Ambulatory Visit (INDEPENDENT_AMBULATORY_CARE_PROVIDER_SITE_OTHER): Payer: 59

## 2023-09-22 ENCOUNTER — Encounter: Payer: Self-pay | Admitting: Internal Medicine

## 2023-09-22 VITALS — BP 108/64 | HR 80 | Ht 74.0 in | Wt 221.8 lb

## 2023-09-22 DIAGNOSIS — R002 Palpitations: Secondary | ICD-10-CM

## 2023-09-22 MED ORDER — ROSUVASTATIN CALCIUM 10 MG PO TABS
10.0000 mg | ORAL_TABLET | Freq: Every day | ORAL | 3 refills | Status: DC
  Filled 2023-09-22: qty 90, 90d supply, fill #0
  Filled 2023-12-26: qty 90, 90d supply, fill #1
  Filled 2024-04-05: qty 90, 90d supply, fill #2
  Filled 2024-07-02: qty 90, 90d supply, fill #3

## 2023-09-22 NOTE — Progress Notes (Unsigned)
ZIO XT serial # [id number] from office inventory applied to patient.

## 2023-09-22 NOTE — Patient Instructions (Addendum)
Medication Instructions:  Your physician recommends that you continue on your current medications as directed. Please refer to the Current Medication list given to you today.  *If you need a refill on your cardiac medications before your next appointment, please call your pharmacy*  Lab Work: None ordered.  If you have labs (blood work) drawn today and your tests are completely normal, you will receive your results only by: MyChart Message (if you have MyChart) OR A paper copy in the mail If you have any lab test that is abnormal or we need to change your treatment, we will call you to review the results.  Testing/Procedures: Your physician has requested that you wear a Zio heart monitor for 3 days. This will be mailed to your home with instructions on how to apply the monitor and how to return it when finished. Please allow 2 weeks after returning the heart monitor before our office calls you with the results.   Follow-Up: At [facility], you and your health needs are our priority.  As part of our continuing mission to provide you with exceptional heart care, we have created designated Provider Care Teams.  These Care Teams include your primary Cardiologist (physician) and Advanced Practice Providers (APPs -  Physician Assistants and Nurse Practitioners) who all work together to provide you with the care you need, when you need it.   Your next appointment:   1 year(s)  The format for your next appointment:   In Person  Provider:   Lewayne Bunting, MD{or one of the following Advanced Practice Providers on your designated Care Team:   Francis Dowse, [place] Casimiro Needle "Mardelle Matte" [place], [place] Earnest Rosier, NP   Important Information About Sugar
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
## 2023-09-30 DIAGNOSIS — R002 Palpitations: Secondary | ICD-10-CM | POA: Diagnosis not present

## 2023-10-13 ENCOUNTER — Other Ambulatory Visit (HOSPITAL_COMMUNITY): Payer: Self-pay

## 2023-11-08 ENCOUNTER — Other Ambulatory Visit: Payer: Self-pay | Admitting: Internal Medicine

## 2023-11-08 ENCOUNTER — Other Ambulatory Visit (HOSPITAL_COMMUNITY): Payer: Self-pay

## 2023-11-08 DIAGNOSIS — F418 Other specified anxiety disorders: Secondary | ICD-10-CM

## 2023-11-08 MED ORDER — SERTRALINE HCL 100 MG PO TABS
100.0000 mg | ORAL_TABLET | Freq: Every day | ORAL | 1 refills | Status: DC
Start: 2023-11-08 — End: 2024-02-15
  Filled 2023-11-08: qty 90, 90d supply, fill #0
  Filled 2024-02-01: qty 90, 90d supply, fill #1

## 2023-11-28 ENCOUNTER — Other Ambulatory Visit (HOSPITAL_BASED_OUTPATIENT_CLINIC_OR_DEPARTMENT_OTHER): Payer: Self-pay

## 2023-11-28 MED ORDER — INFLUENZA VIRUS VACC SPLIT PF (FLUZONE) 0.5 ML IM SUSY
0.5000 mL | PREFILLED_SYRINGE | Freq: Once | INTRAMUSCULAR | 0 refills | Status: AC
  Filled 2023-11-28: qty 0.5, 1d supply, fill #0

## 2023-11-28 MED ORDER — COVID-19 MRNA VAC-TRIS(PFIZER) 30 MCG/0.3ML IM SUSY
0.3000 mL | PREFILLED_SYRINGE | Freq: Once | INTRAMUSCULAR | 0 refills | Status: AC
  Filled 2023-11-28: qty 0.3, 1d supply, fill #0

## 2023-12-19 ENCOUNTER — Other Ambulatory Visit: Payer: Self-pay | Admitting: Internal Medicine

## 2023-12-20 ENCOUNTER — Other Ambulatory Visit (HOSPITAL_COMMUNITY): Payer: Self-pay

## 2023-12-20 MED ORDER — METOPROLOL SUCCINATE ER 25 MG PO TB24
25.0000 mg | ORAL_TABLET | Freq: Every day | ORAL | 2 refills | Status: DC
  Filled 2023-12-20: qty 90, 90d supply, fill #0
  Filled 2024-03-20: qty 90, 90d supply, fill #1
  Filled 2024-06-18: qty 90, 90d supply, fill #2

## 2024-01-10 ENCOUNTER — Other Ambulatory Visit: Payer: Self-pay | Admitting: Internal Medicine

## 2024-01-10 DIAGNOSIS — N401 Enlarged prostate with lower urinary tract symptoms: Secondary | ICD-10-CM

## 2024-01-18 ENCOUNTER — Other Ambulatory Visit: Payer: Self-pay | Admitting: Internal Medicine

## 2024-01-18 ENCOUNTER — Other Ambulatory Visit (HOSPITAL_COMMUNITY): Payer: Self-pay

## 2024-01-18 DIAGNOSIS — N401 Enlarged prostate with lower urinary tract symptoms: Secondary | ICD-10-CM

## 2024-02-01 ENCOUNTER — Other Ambulatory Visit (HOSPITAL_COMMUNITY): Payer: Self-pay

## 2024-02-06 ENCOUNTER — Ambulatory Visit: Payer: 59 | Admitting: Adult Health

## 2024-02-07 ENCOUNTER — Encounter: Payer: Self-pay | Admitting: Adult Health

## 2024-02-07 ENCOUNTER — Telehealth: Payer: Self-pay | Admitting: Adult Health

## 2024-02-07 ENCOUNTER — Ambulatory Visit: Payer: 59 | Admitting: Adult Health

## 2024-02-07 VITALS — BP 112/73 | HR 67 | Ht 74.0 in | Wt 228.0 lb

## 2024-02-07 DIAGNOSIS — Z9682 Presence of neurostimulator: Secondary | ICD-10-CM

## 2024-02-07 NOTE — Progress Notes (Unsigned)
 PATIENT: Rickey Cochran DOB: 05-11-1961  REASON FOR VISIT: follow up HISTORY FROM: patient PRIMARY NEUROLOGIST: Dr. Pearlean Brownie  Chief Complaint  Patient presents with   sleep lab Rm 4    Patient is here alone for 6 month inspire check. Patient states things are going well but he can't seem to get the app connected. He states it was connected at one point. He brought his remote with him today. ESS 9 FSS 20     HISTORY OF PRESENT ILLNESS: Today 02/07/24  Rickey Cochran is a 63 y.o. male who has been followed in this office for inspire following up. Reports that inspire is working well. Doesn't have any complaints other than his APP is not working. Currently at 1.7V- this is what works best for him. Higher made him uncomfortable. DL is below.       HISTORY 08/09/2023: He reports doing well, tolerating the inspire, uses it every night, no new concerns.  He does endorse tiredness especially towards the evening, difficulty staying up till bedtime.  He takes his sertraline right at bedtime.  He still takes a low-dose Toprol but has significant bradycardia at times, currently around 40 bpm.  He has an appointment with his cardiologist next month.  I reviewed his inspire compliance data.  In the last 90 days he uses inspire device every night for an average of 8 hours and 35 minutes, 99% of the time he reached more than 4 hours.  Start delay is at 20 minutes, pause at 15 minutes but he has not utilized the pause button.  He is incoming voltage is 1.6.  We trialed him on 1.6 V and 1.7 V today, he had good tongue protrusion and tolerance of 1.7 V and we reset his level to 1.7 V which is level 3, giving him an option to go to levels up in 2 levels down for total of 5 levels available on his remote.  If he tolerates the 1.7 V, he is encouraged to stay on it.   REVIEW OF SYSTEMS: Out of a complete 14 system review of symptoms, the patient complains only of the following symptoms, and all other  reviewed systems are negative.  ALLERGIES: No Known Allergies  HOME MEDICATIONS: Outpatient Medications Prior to Visit  Medication Sig Dispense Refill   acetaminophen (TYLENOL) 500 MG tablet Take 1,000 mg by mouth as needed (pain).     ibuprofen (ADVIL) 200 MG tablet Take 400-800 mg by mouth as needed.     levothyroxine (SYNTHROID) 88 MCG tablet Take 1 tablet (88 mcg total) by mouth every morning on an empty stomach 90 tablet 1   metoprolol succinate (TOPROL XL) 25 MG 24 hr tablet Take 1 tablet  by mouth daily. 90 tablet 2   rosuvastatin (CRESTOR) 10 MG tablet Take 1 tablet (10 mg total) by mouth daily. 90 tablet 3   sertraline (ZOLOFT) 100 MG tablet Take 1 tablet (100 mg total) by mouth at bedtime. 90 tablet 1   alfuzosin (UROXATRAL) 10 MG 24 hr tablet Take 1 tablet (10 mg total) by mouth daily with breakfast. (Patient not taking: Reported on 02/07/2024) 90 tablet 1   No facility-administered medications prior to visit.    PAST MEDICAL HISTORY: Past Medical History:  Diagnosis Date   Arthritis    BPH (benign prostatic hyperplasia)    Cancer (HCC)    s/p partial thyroidectomy   Depression    Dysrhythmia    Hypertension    several years ago  was on lisiopril but lost weight and no longer needs   Hypothyroidism    mgd on levothyroxine    Left-sided tinnitus    Premature atrial contraction    was seen on EKG at his PCP approx 8 years ago, denies any symptoms, reports he was a runner before onset of hip pain in feb 2020   Sleep apnea    not using CPAP    PAST SURGICAL HISTORY: Past Surgical History:  Procedure Laterality Date   COLONOSCOPY     DRUG INDUCED ENDOSCOPY N/A 10/27/2022   Procedure: DRUG INDUCED SLEEP ENDOSCOPY;  Surgeon: Osborn Coho, MD;  Location: [facility];  Service: ENT;  Laterality: N/A;   IMPLANTATION OF HYPOGLOSSAL NERVE STIMULATOR Right 03/16/2023   Procedure: IMPLANTATION OF HYPOGLOSSAL NERVE STIMULATOR;  Surgeon: Christia Reading, MD;   Location: [facility];  Service: ENT;  Laterality: Right;   IR THORACENTESIS ASP PLEURAL SPACE W/IMG GUIDE  03/20/2021   JOINT REPLACEMENT Right 04/13/2019   right total hip arthroplasty   THYROID LOBECTOMY Right 05/09/2018   THYROID LOBECTOMY Right 05/09/2018   Procedure: RIGHT THYROID LOBECTOMY;  Surgeon: Axel Filler, MD;  Location[facility] OR;  Service: General;  Laterality: Right;   TOTAL HIP ARTHROPLASTY Right 04/13/2019   Procedure: RIGHT TOTAL HIP ARTHROPLASTY ANTERIOR APPROACH;  Surgeon: Kathryne Hitch, MD;  Location: [facility];  Service: Orthopedics;  Laterality: Right;   TOTAL KNEE ARTHROPLASTY Left 11/12/2021   Procedure: LEFT TOTAL KNEE ARTHROPLASTY;  Surgeon: Cammy Copa, MD;  Location: [facility] OR;  Service: Orthopedics;  Laterality: Left;    FAMILY HISTORY: Family History  Problem Relation Age of Onset   Atrial fibrillation Mother    Arthritis Mother    Sleep apnea Father    Heart Problems Father        has a Visual merchandiser    SOCIAL HISTORY: Social History   Socioeconomic History   Marital status: Married    Spouse name: Not on file   Number of children: Not on file   Years of education: Not on file   Highest education level: Associate degree: academic program  Occupational History    Comment: Self employed  Tobacco Use   Smoking status: Former   Smokeless tobacco: Never   Tobacco comments:    QUIT IN 2004  Vaping Use   Vaping status: Never Used  Substance and Sexual Activity   Alcohol use: Not Currently    Alcohol/week: 0.0 standard drinks of alcohol    Comment: quit   Drug use: Never   Sexual activity: Yes  Other Topics Concern   Not on file  Social History Narrative   He and his wife are vegan and very active in hiking, backpacking, cycling.   Social Drivers of Corporate investment banker Strain: Low Risk  (06/29/2023)   Overall Financial Resource Strain (CARDIA)    Difficulty of Paying Living Expenses: Not hard at all  Food  Insecurity: No Food Insecurity (06/29/2023)   Hunger Vital Sign    Worried About Running Out of Food in the Last Year: Never true    Ran Out of Food in the Last Year: Never true  Transportation Needs: No Transportation Needs (06/29/2023)   PRAPARE - Administrator, Civil Service (Medical): No    Lack of Transportation (Non-Medical): No  Physical Activity: Sufficiently Active (06/29/2023)   Exercise Vital Sign    Days of Exercise per Week: 4 days    Minutes of  Exercise per Session: 40 min  Stress: No Stress Concern Present (06/29/2023)   [employer] of Occupational Health - Occupational Stress Questionnaire    Feeling of Stress : Only a little  Social Connections: Moderately Isolated (06/29/2023)   Social Connection and Isolation Panel [NHANES]    Frequency of Communication with Friends and Family: More than three times a week    Frequency of Social Gatherings with Friends and Family: Three times a week    Attends Religious Services: Never    Active Member of Clubs or Organizations: No    Attends Banker Meetings: Not on file    Marital Status: Married  Catering manager Violence: Not on file      PHYSICAL EXAM  Vitals:   02/07/24 0815  BP: 112/73  Pulse: 67  Weight: 228 lb (103.4 kg)  Height: 6\' 2"  (1.88 m)   Body mass index is 29.27 kg/m.  Generalized: Well developed, in no acute distress  Chest: lungs clear to auscultation   Neurological examination  Mentation: Alert oriented to time, place, history taking. Follows all commands speech and language fluent Cranial nerve II-XII: Pupils were equal round reactive to light. With stimulation tongue protrusion was straight forward.  DIAGNOSTIC DATA (LABS, IMAGING, TESTING) - I reviewed patient records, labs, notes, testing and imaging myself where available.  Lab Results  Component Value Date   WBC 6.8 06/30/2023   HGB 15.5 06/30/2023   HCT 47.4 06/30/2023   MCV 95.4 06/30/2023   PLT 256.0  06/30/2023      Component Value Date/Time   NA 139 06/30/2023 1142   K 4.6 06/30/2023 1142   CL 104 06/30/2023 1142   CO2 29 06/30/2023 1142   GLUCOSE 81 06/30/2023 1142   BUN 13 06/30/2023 1142   CREATININE 0.77 06/30/2023 1142   CALCIUM 9.3 06/30/2023 1142   PROT 6.4 06/30/2023 1142   PROT 6.5 09/28/2022 0748   ALBUMIN 4.2 06/30/2023 1142   ALBUMIN 4.3 09/28/2022 0748   AST 29 06/30/2023 1142   ALT 37 06/30/2023 1142   ALKPHOS 50 06/30/2023 1142   BILITOT 1.1 06/30/2023 1142   BILITOT 1.0 09/28/2022 0748   GFRNONAA >60 01/05/2022 0638   GFRAA >60 04/14/2019 0347   Lab Results  Component Value Date   CHOL 149 06/30/2023   HDL 46.40 06/30/2023   LDLCALC 77 06/30/2023   LDLDIRECT 151.0 02/17/2021   TRIG 129.0 06/30/2023   CHOLHDL 3 06/30/2023   Lab Results  Component Value Date   HGBA1C 5.3 05/23/2019   Lab Results  Component Value Date   VITAMINB12 467 05/11/2022   Lab Results  Component Value Date   TSH 1.28 06/30/2023      ASSESSMENT AND PLAN 63 y.o. year old male  has a past medical history of Arthritis, BPH (benign prostatic hyperplasia), Cancer (HCC), Depression, Dysrhythmia, Hypertension, Hypothyroidism, Left-sided tinnitus, Premature atrial contraction, and Sleep apnea. here with:  OSA with hypoglossal nerve stimulator   Patient is doing well with inspire device. Tolerating it will. Currenting setting is at 1.7 and he is tolerating this well. Encouraged to call if he has any issues. He will follow-up with Dr. Frances Furbish and HST repeated at the next visit.     Butch Penny, MSN, NP-C 02/07/2024, 8:28 AM [facility] [address], [address] [place], [place] [address] [phone]

## 2024-02-07 NOTE — Telephone Encounter (Signed)
 Patient was seen today for his inspire follow up. Everything is going good for the patient. No changes were made.

## 2024-02-08 ENCOUNTER — Ambulatory Visit: Admitting: Podiatry

## 2024-02-08 NOTE — Telephone Encounter (Signed)
 Patient was seen today for his inspire follow up. Everything is going good for the patient. No changes were made.

## 2024-02-15 ENCOUNTER — Other Ambulatory Visit (HOSPITAL_COMMUNITY): Payer: Self-pay

## 2024-02-15 ENCOUNTER — Ambulatory Visit (INDEPENDENT_AMBULATORY_CARE_PROVIDER_SITE_OTHER): Payer: Self-pay | Admitting: Internal Medicine

## 2024-02-15 ENCOUNTER — Encounter: Payer: Self-pay | Admitting: Internal Medicine

## 2024-02-15 VITALS — BP 128/78 | HR 59 | Temp 98.0°F | Resp 16 | Ht 74.0 in | Wt 224.6 lb

## 2024-02-15 DIAGNOSIS — D539 Nutritional anemia, unspecified: Secondary | ICD-10-CM | POA: Diagnosis not present

## 2024-02-15 DIAGNOSIS — F418 Other specified anxiety disorders: Secondary | ICD-10-CM

## 2024-02-15 DIAGNOSIS — E890 Postprocedural hypothyroidism: Secondary | ICD-10-CM | POA: Diagnosis not present

## 2024-02-15 DIAGNOSIS — N401 Enlarged prostate with lower urinary tract symptoms: Secondary | ICD-10-CM | POA: Diagnosis not present

## 2024-02-15 DIAGNOSIS — E785 Hyperlipidemia, unspecified: Secondary | ICD-10-CM

## 2024-02-15 DIAGNOSIS — R001 Bradycardia, unspecified: Secondary | ICD-10-CM

## 2024-02-15 DIAGNOSIS — R351 Nocturia: Secondary | ICD-10-CM | POA: Diagnosis not present

## 2024-02-15 LAB — CBC WITH DIFFERENTIAL/PLATELET
Basophils Absolute: 0 10*3/uL (ref 0.0–0.1)
Basophils Relative: 0.7 % (ref 0.0–3.0)
Eosinophils Absolute: 0.1 10*3/uL (ref 0.0–0.7)
Eosinophils Relative: 2.1 % (ref 0.0–5.0)
HCT: 48.3 % (ref 39.0–52.0)
Hemoglobin: 16.5 g/dL (ref 13.0–17.0)
Lymphocytes Relative: 26.7 % (ref 12.0–46.0)
Lymphs Abs: 1.4 10*3/uL (ref 0.7–4.0)
MCHC: 34.2 g/dL (ref 30.0–36.0)
MCV: 96 fl (ref 78.0–100.0)
Monocytes Absolute: 0.4 10*3/uL (ref 0.1–1.0)
Monocytes Relative: 8.3 % (ref 3.0–12.0)
Neutro Abs: 3.3 10*3/uL (ref 1.4–7.7)
Neutrophils Relative %: 62.2 % (ref 43.0–77.0)
Platelets: 209 10*3/uL (ref 150.0–400.0)
RBC: 5.03 Mil/uL (ref 4.22–5.81)
RDW: 12.6 % (ref 11.5–15.5)
WBC: 5.3 10*3/uL (ref 4.0–10.5)

## 2024-02-15 LAB — LIPID PANEL
Cholesterol: 166 mg/dL (ref 0–200)
HDL: 48.2 mg/dL (ref >39.00)
LDL Cholesterol: 99 mg/dL (ref 0–99)
NonHDL: 117.79
Total CHOL/HDL Ratio: 3
Triglycerides: 96 mg/dL (ref 0.0–149.0)
VLDL: 19.2 mg/dL (ref 0.0–40.0)

## 2024-02-15 LAB — BASIC METABOLIC PANEL WITH GFR
BUN: 14 mg/dL (ref 6–23)
CO2: 27 meq/L (ref 19–32)
Calcium: 9.2 mg/dL (ref 8.4–10.5)
Chloride: 104 meq/L (ref 96–112)
Creatinine, Ser: 0.78 mg/dL (ref 0.40–1.50)
GFR: 95.13 mL/min (ref >60.00)
Glucose, Bld: 99 mg/dL (ref 70–99)
Potassium: 4.7 meq/L (ref 3.5–5.1)
Sodium: 140 meq/L (ref 135–145)

## 2024-02-15 MED ORDER — SERTRALINE HCL 100 MG PO TABS
100.0000 mg | ORAL_TABLET | Freq: Every day | ORAL | 1 refills | Status: DC
  Filled 2024-02-15 – 2024-05-09 (×2): qty 90, 90d supply, fill #0
  Filled 2024-08-04: qty 90, 90d supply, fill #1

## 2024-02-15 NOTE — Progress Notes (Unsigned)
 Subjective:  Patient ID: Rickey Cochran, male    DOB: [birth year]  Age: 63 y.o. MRN: [id number]  CC: Anemia and Hypothyroidism   HPI Rickey Cochran presents for f/up ----  Discussed the use of AI scribe software for clinical note transcription with the patient, who gave verbal consent to proceed.  History of Present Illness   Rickey Cochran "Rickey Cochran" is a 63 year old male who presents for a routine follow-up visit.  He experiences shortness of breath when hiking up a mountain, which he attributes to the steep incline. This is not a new symptom, and there are no associated symptoms such as chest pain or cold sweats during these episodes.  He mentions a slight weight gain of two pounds since October and wants to lose weight. He is unsure if this is related to his thyroid condition but does not report any symptoms suggestive of thyroid dysfunction, such as constipation, diarrhea, fatigue, weight gain, or weight loss.  He generally feels well and stays active. No palpitations, dizziness, lightheadedness, chest pain, cold sweats, constipation, diarrhea, fatigue, weight gain, or weight loss.       Outpatient Medications Prior to Visit  Medication Sig Dispense Refill   acetaminophen (TYLENOL) 500 MG tablet Take 1,000 mg by mouth as needed (pain).     ibuprofen (ADVIL) 200 MG tablet Take 400-800 mg by mouth as needed.     metoprolol succinate (TOPROL XL) 25 MG 24 hr tablet Take 1 tablet  by mouth daily. 90 tablet 2   rosuvastatin (CRESTOR) 10 MG tablet Take 1 tablet (10 mg total) by mouth daily. 90 tablet 3   levothyroxine (SYNTHROID) 88 MCG tablet Take 1 tablet (88 mcg total) by mouth every morning on an empty stomach 90 tablet 1   sertraline (ZOLOFT) 100 MG tablet Take 1 tablet (100 mg total) by mouth at bedtime. 90 tablet 1   alfuzosin (UROXATRAL) 10 MG 24 hr tablet Take 1 tablet (10 mg total) by mouth daily with breakfast. (Patient not taking: Reported on 02/15/2024) 90 tablet 1    No facility-administered medications prior to visit.    ROS Review of Systems  Constitutional:  Negative for chills, diaphoresis, fatigue and unexpected weight change.  HENT: Negative.    Eyes:  Negative for visual disturbance.  Respiratory:  Positive for shortness of breath (DOE). Negative for cough, chest tightness and wheezing.   Cardiovascular:  Negative for chest pain, palpitations and leg swelling.  Gastrointestinal:  Negative for abdominal pain, constipation, diarrhea, nausea and vomiting.  Endocrine: Negative for cold intolerance and heat intolerance.  Genitourinary: Negative.  Negative for difficulty urinating.  Musculoskeletal:  Positive for arthralgias. Negative for myalgias.  Skin: Negative.  Negative for color change.  Neurological:  Negative for dizziness and weakness.  Hematological:  Negative for adenopathy. Does not bruise/bleed easily.  Psychiatric/Behavioral: Negative.      Objective:  BP 128/78 (BP Location: Left Arm, Patient Position: Sitting, Cuff Size: Normal)   Pulse (!) 59   Temp 98 F (36.7 C) (Oral)   Resp 16   Ht 6\' 2"  (1.88 m)   Wt 224 lb 9.6 oz (101.9 kg)   SpO2 98%   BMI 28.84 kg/m   BP Readings from Last 3 Encounters:  02/15/24 128/78  02/07/24 112/73  09/22/23 108/64    Wt Readings from Last 3 Encounters:  02/15/24 224 lb 9.6 oz (101.9 kg)  02/07/24 228 lb (103.4 kg)  09/22/23 221 lb 12.8 oz (100.6 kg)  Physical Exam Vitals reviewed.  Constitutional:      Appearance: Normal appearance.  HENT:     Mouth/Throat:     Mouth: Mucous membranes are moist.  Eyes:     General: No scleral icterus.    Conjunctiva/sclera: Conjunctivae normal.  Cardiovascular:     Rate and Rhythm: Bradycardia present.     Heart sounds: No murmur heard.    No friction rub. No gallop.     Comments: EKG-- SB, 53 bpm No LVH, Q waves, or ST/T wave changes  Unchanged  Pulmonary:     Effort: Pulmonary effort is normal.     Breath sounds: No stridor.  No wheezing, rhonchi or rales.  Abdominal:     General: Abdomen is flat.     Palpations: There is no mass.     Tenderness: There is no abdominal tenderness. There is no guarding.     Hernia: No hernia is present.  Musculoskeletal:        General: Normal range of motion.     Cervical back: Neck supple.     Right lower leg: No edema.     Left lower leg: No edema.  Lymphadenopathy:     Cervical: No cervical adenopathy.  Skin:    General: Skin is warm and dry.  Neurological:     General: No focal deficit present.     Mental Status: He is alert. Mental status is at baseline.  Psychiatric:        Mood and Affect: Mood normal.        Behavior: Behavior normal.     Lab Results  Component Value Date   WBC 5.3 02/15/2024   HGB 16.5 02/15/2024   HCT 48.3 02/15/2024   PLT 209.0 02/15/2024   GLUCOSE 99 02/15/2024   CHOL 166 02/15/2024   TRIG 96.0 02/15/2024   HDL 48.20 02/15/2024   LDLDIRECT 151.0 02/17/2021   LDLCALC 99 02/15/2024   ALT 37 06/30/2023   AST 29 06/30/2023   NA 140 02/15/2024   K 4.7 02/15/2024   CL 104 02/15/2024   CREATININE 0.78 02/15/2024   BUN 14 02/15/2024   CO2 27 02/15/2024   TSH 1.30 02/15/2024   PSA 1.56 02/15/2024   HGBA1C 5.3 05/23/2019    US THYROID Result Date: 07/11/2023 CLINICAL DATA:  Other. History of thyroid cancer status post right hemithyroidectomy EXAM: THYROID ULTRASOUND TECHNIQUE: Ultrasound examination of the thyroid gland and adjacent soft tissues was performed. COMPARISON:  Prior thyroid ultrasound 04/27/2021; 08/25/2018 FINDINGS: Parenchymal Echotexture: Mildly heterogenous Isthmus: Surgically absent Right lobe: Surgically absent Left lobe: 5.3 x 2.2 x 2.0 cm _________________________________________________________ Estimated total number of nodules >/= 1 cm: 1 Number of spongiform nodules >/=  2 cm not described below (TR1): 0 Number of mixed cystic and solid nodules >/= 1.5 cm not described below (TR2): 0  _________________________________________________________ Surgical changes of prior right hemithyroidectomy. However, there is developing homogeneous tissue in the right thyroid resection bed which measures 2.0 x 1.1 x 1.9 cm. This is slightly enlarged compared to 1.7 x 1.0 cm previously. There is internal vascular flow. The imaging appearance is most consistent with recurrent thyroid tissue. No discrete nodules. Nodule # 1: Small circumscribed hypoechoic nodule in the left lower gland measures 1.0 x 0.8 x 0.7 cm, unchanged compared to prior. This exam marks 5 years of stability dating back to 2019. IMPRESSION: 1. Surgical changes of prior right hemithyroidectomy with a small amount of recurrent thyroid tissue in the right thyroid resection bed which  has incrementally enlarged compared to 2022. No recurrent nodules or suspicious features. 2. Confirmed 5 year stability of small 1 cm TI-RADS category 4 nodule in the left lower gland. This lesion can now be considered benign, no further follow-up imaging is recommended. 3. No new nodules. The above is in keeping with the ACR TI-RADS recommendations - J Am Coll Radiol 2017;14:587-595. Electronically Signed   By: Malachy Moan M.D.   On: 07/11/2023 07:07    Assessment & Plan:   Chronic sinus bradycardia- He is asx with this. -     TSH; Future -     EKG 12-Lead -     Basic metabolic panel with GFR; Future  BPH associated with nocturia -     PSA; Future -     Basic metabolic panel with GFR; Future  Postoperative hypothyroidism- He is euthyroid. -     TSH; Future -     Basic metabolic panel with GFR; Future -     Levothyroxine Sodium; Take 1 tablet (88 mcg total) by mouth every morning on an empty stomach  Dispense: 90 tablet; Refill: 1  Deficiency anemia -     Basic metabolic panel with GFR; Future -     Vitamin B12; Future -     CBC with Differential/Platelet; Future -     Folate; Future  Dyslipidemia, goal LDL below 100- LDL goal achieved.  Doing well on the statin  -     Lipid panel; Future  Depression with anxiety -     Sertraline HCl; Take 1 tablet (100 mg total) by mouth at bedtime.  Dispense: 90 tablet; Refill: 1  Other orders -     EKG     Follow-up: Return in about 6 months (around 08/17/2024).  Sanda Linger, MD

## 2024-02-15 NOTE — Patient Instructions (Signed)
 Bradycardia, Adult Bradycardia is a slower-than-normal heartbeat. A normal resting heart rate for an adult ranges from 60 to 100 beats per minute. With bradycardia, the resting heart rate is less than 60 beats per minute. Bradycardia can prevent enough oxygen from reaching certain areas of your body when you are active. It can be serious if it keeps enough oxygen from reaching your brain and other parts of your body. Bradycardia is not a problem for everyone. For some healthy adults, a slow resting heart rate is normal. What are the causes? This condition may be caused by: A problem with the heart, including: A problem with the heart's electrical system, such as a heart block. With a heart block, electrical signals between the chambers of the heart are partially or completely blocked, so they are not able to work as they should. A problem with the heart's natural pacemaker (sinus node). Heart disease. A heart attack. Heart damage. Lyme disease. A heart infection. A heart condition that is present at birth (congenital heart defect). Certain medicines that treat heart conditions. Certain conditions, such as hypothyroidism and obstructive sleep apnea. Problems with the balance of chemicals and other substances, like potassium, in the blood. Trauma. Radiation therapy. What increases the risk? You are more likely to develop this condition if you: Are age 51 or older. Have high blood pressure (hypertension), high cholesterol (hyperlipidemia), or diabetes. Drink heavily, use tobacco or nicotine products, or use drugs. What are the signs or symptoms? Symptoms of this condition include: Light-headedness. Feeling faint or fainting. Fatigue and weakness. Trouble with activity or exercise. Shortness of breath. Chest pain (angina). Drowsiness. Confusion. Dizziness. How is this diagnosed? This condition may be diagnosed based on: Your symptoms. Your medical history. A physical exam. During  the exam, your health care provider will listen to your heartbeat and check your pulse. To confirm the diagnosis, your health care provider may order tests, such as: Blood tests. An electrocardiogram (ECG). This test records the heart's electrical activity. The test can show how fast your heart is beating and whether the heartbeat is steady. A test in which you wear a portable device (event recorder or Holter monitor) to record your heart's electrical activity while you go about your day. An exercise test. How is this treated? Treatment for this condition depends on the cause of the condition and how severe your symptoms are. Treatment may involve: Treatment of the underlying condition. Changing your medicines or how much medicine you take. Having a small, battery-operated device called a pacemaker implanted under the skin. When bradycardia occurs, this device can be used to increase your heart rate and help your heart beat in a regular rhythm. Follow these instructions at home: Lifestyle Manage any health conditions that contribute to bradycardia as told by your health care provider. Follow a heart-healthy diet. A nutrition specialist (dietitian) can help educate you about healthy food options and changes. Follow an exercise program that is approved by your health care provider. Maintain a healthy weight. Try to reduce or manage your stress, such as with yoga or meditation. If you need help reducing stress, ask your health care provider. Do not use any products that contain nicotine or tobacco. These products include cigarettes, chewing tobacco, and vaping devices, such as e-cigarettes. If you need help quitting, ask your health care provider. Do not use illegal drugs. Alcohol use If you drink alcohol: Limit how much you have to: 0-1 drink a day for women who are not pregnant. 0-2 drinks a day  for men. Know how much alcohol is in a drink. In the [place] one drink equals one 12 oz bottle of  beer (355 mL), one 5 oz glass of wine (148 mL), or one 1 oz glass of hard liquor (44 mL). General instructions Take over-the-counter and prescription medicines only as told by your health care provider. Keep all follow-up visits. This is important. How is this prevented? In some cases, bradycardia may be prevented by: Treating underlying medical problems. Stopping behaviors or medicines that can trigger the condition. Contact a health care provider if: You feel light-headed or dizzy. You almost faint. You feel weak or are easily fatigued during physical activity. You experience confusion or have memory problems. Get help right away if: You faint. You have chest pains or an irregular heartbeat (palpitations). You have trouble breathing. These symptoms may represent a serious problem that is an emergency. Do not wait to see if the symptoms will go away. Get medical help right away. Call your local emergency services (911 in the [place] Do not drive yourself to the hospital. Summary Bradycardia is a slower-than-normal heartbeat. With bradycardia, the resting heart rate is less than 60 beats per minute. Treatment for this condition depends on the cause. Manage any health conditions that contribute to bradycardia as told by your health care provider. Do not use any products that contain nicotine or tobacco. These products include cigarettes, chewing tobacco, and vaping devices, such as e-cigarettes. Keep all follow-up visits. This is important. This information is not intended to replace advice given to you by your health care provider. Make sure you discuss any questions you have with your health care provider. Document Revised: 03/01/2021 Document Reviewed: 03/01/2021 Elsevier Patient Education  2024 [employer].

## 2024-02-16 LAB — TSH: TSH: 1.3 u[IU]/mL (ref 0.35–5.50)

## 2024-02-16 LAB — FOLATE: Folate: 20.3 ng/mL (ref >5.9)

## 2024-02-16 LAB — VITAMIN B12: Vitamin B-12: 409 pg/mL (ref 211–911)

## 2024-02-16 LAB — PSA: PSA: 1.56 ng/mL (ref 0.10–4.00)

## 2024-02-17 ENCOUNTER — Other Ambulatory Visit (HOSPITAL_COMMUNITY): Payer: Self-pay

## 2024-02-17 ENCOUNTER — Other Ambulatory Visit: Payer: Self-pay

## 2024-02-17 ENCOUNTER — Encounter: Payer: Self-pay | Admitting: Internal Medicine

## 2024-02-17 MED ORDER — LEVOTHYROXINE SODIUM 88 MCG PO TABS
88.0000 ug | ORAL_TABLET | Freq: Every morning | ORAL | 1 refills | Status: DC
  Filled 2024-02-17: qty 90, 90d supply, fill #0
  Filled 2024-05-22: qty 90, 90d supply, fill #1

## 2024-05-09 ENCOUNTER — Other Ambulatory Visit: Payer: Self-pay

## 2024-05-09 ENCOUNTER — Other Ambulatory Visit (HOSPITAL_COMMUNITY): Payer: Self-pay

## 2024-05-14 ENCOUNTER — Other Ambulatory Visit: Payer: Self-pay | Admitting: Medical Genetics

## 2024-05-22 ENCOUNTER — Other Ambulatory Visit

## 2024-05-22 DIAGNOSIS — Z006 Encounter for examination for normal comparison and control in clinical research program: Secondary | ICD-10-CM

## 2024-06-05 LAB — GENECONNECT MOLECULAR SCREEN: Genetic Analysis Overall Interpretation: NEGATIVE

## 2024-06-18 ENCOUNTER — Other Ambulatory Visit (HOSPITAL_COMMUNITY): Payer: Self-pay

## 2024-08-01 ENCOUNTER — Telehealth: Payer: Self-pay | Admitting: Orthopedic Surgery

## 2024-08-01 ENCOUNTER — Other Ambulatory Visit: Payer: Self-pay

## 2024-08-01 ENCOUNTER — Other Ambulatory Visit (HOSPITAL_COMMUNITY): Payer: Self-pay

## 2024-08-01 MED ORDER — AMOXICILLIN 500 MG PO CAPS
2000.0000 mg | ORAL_CAPSULE | Freq: Once | ORAL | 0 refills | Status: AC
  Filled 2024-08-01: qty 8, 2d supply, fill #0

## 2024-08-01 NOTE — Telephone Encounter (Signed)
 Patient called and having a dental procedure done and needs the antibiotics.  CB#[phone]

## 2024-08-13 ENCOUNTER — Other Ambulatory Visit (INDEPENDENT_AMBULATORY_CARE_PROVIDER_SITE_OTHER): Payer: Self-pay

## 2024-08-13 ENCOUNTER — Ambulatory Visit (INDEPENDENT_AMBULATORY_CARE_PROVIDER_SITE_OTHER): Admitting: Physician Assistant

## 2024-08-13 DIAGNOSIS — M25552 Pain in left hip: Secondary | ICD-10-CM | POA: Diagnosis not present

## 2024-08-13 NOTE — Progress Notes (Signed)
 HPI: [name] 63 year old male well-known to Dr. Damian service.  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  He states the right hip overall is doing well.  His main complaint is left hip pain for the last 9 months on and off.  States pain is worse with walking.  Rates pain 7 out of 10.  Worse over the last month.  He states today he is overall having a good day.  He is having pain in the anterior aspect of the hip in the posterior aspect.  No known injury.  Denies any numbness tingling down the leg.  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  However his plain films were doing well.  He has tried ibuprofen  Tylenol .  He is also limiting his activities due to the hip pain.  He is using no assistive device to ambulate.  Denies any numbness tingling down either leg  Review of systems: See HPI otherwise negative  Physical exam: General Well-developed well-nourished male in no acute distress ambulates without any assistive device able to get on and off the exam table on his own. Respirations: Nonlabored Bilateral hips: Right hip excellent range of motion without pain.  Left hip full external rotation without pain.  Internal rotation is slightly limited and causes discomfort.  Cervical flexion bilateral hips fluid.  Radiographs: AP pelvis lateral view left hip: Both hips well located.  Status post right total hip arthroplasty well-seated components.  Left hip mild narrowing joint space with impingement type osteophyte.  No acute fractures no bony abnormalities no evidence of AVN.  Impression: Left hip pain  Plan: Discussed with him intra-articular injection versus getting an MRI.  He feels an MRI of the left hip is warranted given his prior history of right hip arthritis only being seen on MRI imaging.  Let him undergo an MRI of the left hip to evaluate the cartilage.  He follow-up with Dr. Vernetta to go  over the results and discuss further treatment.  Questions were encouraged and answered at length.

## 2024-08-14 ENCOUNTER — Encounter: Payer: Self-pay | Admitting: Physician Assistant

## 2024-08-20 ENCOUNTER — Other Ambulatory Visit

## 2024-08-21 ENCOUNTER — Encounter: Payer: Self-pay | Admitting: Physician Assistant

## 2024-08-22 ENCOUNTER — Other Ambulatory Visit

## 2024-08-27 ENCOUNTER — Ambulatory Visit: Admitting: Internal Medicine

## 2024-08-27 ENCOUNTER — Other Ambulatory Visit (HOSPITAL_COMMUNITY): Payer: Self-pay

## 2024-08-27 ENCOUNTER — Ambulatory Visit: Payer: Self-pay | Admitting: Internal Medicine

## 2024-08-27 ENCOUNTER — Ambulatory Visit (INDEPENDENT_AMBULATORY_CARE_PROVIDER_SITE_OTHER)

## 2024-08-27 ENCOUNTER — Encounter: Payer: Self-pay | Admitting: Internal Medicine

## 2024-08-27 VITALS — BP 118/74 | HR 57 | Temp 98.5°F | Resp 16 | Ht 74.0 in | Wt 226.4 lb

## 2024-08-27 DIAGNOSIS — N401 Enlarged prostate with lower urinary tract symptoms: Secondary | ICD-10-CM

## 2024-08-27 DIAGNOSIS — F418 Other specified anxiety disorders: Secondary | ICD-10-CM

## 2024-08-27 DIAGNOSIS — Z23 Encounter for immunization: Secondary | ICD-10-CM

## 2024-08-27 DIAGNOSIS — R351 Nocturia: Secondary | ICD-10-CM

## 2024-08-27 DIAGNOSIS — E785 Hyperlipidemia, unspecified: Secondary | ICD-10-CM | POA: Diagnosis not present

## 2024-08-27 DIAGNOSIS — S2241XA Multiple fractures of ribs, right side, initial encounter for closed fracture: Secondary | ICD-10-CM | POA: Diagnosis not present

## 2024-08-27 DIAGNOSIS — R0602 Shortness of breath: Secondary | ICD-10-CM | POA: Diagnosis not present

## 2024-08-27 DIAGNOSIS — R001 Bradycardia, unspecified: Secondary | ICD-10-CM

## 2024-08-27 DIAGNOSIS — J22 Unspecified acute lower respiratory infection: Secondary | ICD-10-CM | POA: Diagnosis not present

## 2024-08-27 DIAGNOSIS — E890 Postprocedural hypothyroidism: Secondary | ICD-10-CM | POA: Diagnosis not present

## 2024-08-27 DIAGNOSIS — R052 Subacute cough: Secondary | ICD-10-CM

## 2024-08-27 DIAGNOSIS — R0989 Other specified symptoms and signs involving the circulatory and respiratory systems: Secondary | ICD-10-CM | POA: Diagnosis not present

## 2024-08-27 DIAGNOSIS — R059 Cough, unspecified: Secondary | ICD-10-CM | POA: Diagnosis not present

## 2024-08-27 DIAGNOSIS — Z1211 Encounter for screening for malignant neoplasm of colon: Secondary | ICD-10-CM

## 2024-08-27 LAB — CBC WITH DIFFERENTIAL/PLATELET
Basophils Absolute: 0 K/uL (ref 0.0–0.1)
Basophils Relative: 0.5 % (ref 0.0–3.0)
Eosinophils Absolute: 0.1 K/uL (ref 0.0–0.7)
Eosinophils Relative: 1.8 % (ref 0.0–5.0)
HCT: 46.9 % (ref 39.0–52.0)
Hemoglobin: 15.9 g/dL (ref 13.0–17.0)
Lymphocytes Relative: 24.7 % (ref 12.0–46.0)
Lymphs Abs: 2 K/uL (ref 0.7–4.0)
MCHC: 33.9 g/dL (ref 30.0–36.0)
MCV: 93.3 fl (ref 78.0–100.0)
Monocytes Absolute: 0.6 K/uL (ref 0.1–1.0)
Monocytes Relative: 6.9 % (ref 3.0–12.0)
Neutro Abs: 5.3 K/uL (ref 1.4–7.7)
Neutrophils Relative %: 66.1 % (ref 43.0–77.0)
Platelets: 216 K/uL (ref 150.0–400.0)
RBC: 5.02 Mil/uL (ref 4.22–5.81)
RDW: 12.6 % (ref 11.5–15.5)
WBC: 8 K/uL (ref 4.0–10.5)

## 2024-08-27 LAB — BASIC METABOLIC PANEL WITH GFR
BUN: 13 mg/dL (ref 6–23)
CO2: 25 meq/L (ref 19–32)
Calcium: 9.5 mg/dL (ref 8.4–10.5)
Chloride: 105 meq/L (ref 96–112)
Creatinine, Ser: 0.79 mg/dL (ref 0.40–1.50)
GFR: 94.41 mL/min (ref >60.00)
Glucose, Bld: 87 mg/dL (ref 70–99)
Potassium: 4.2 meq/L (ref 3.5–5.1)
Sodium: 144 meq/L (ref 135–145)

## 2024-08-27 LAB — PSA: PSA: 1.49 ng/mL (ref 0.10–4.00)

## 2024-08-27 LAB — HEPATIC FUNCTION PANEL
ALT: 32 U/L (ref 0–53)
AST: 27 U/L (ref 0–37)
Albumin: 4.6 g/dL (ref 3.5–5.2)
Alkaline Phosphatase: 46 U/L (ref 39–117)
Bilirubin, Direct: 0.2 mg/dL (ref 0.0–0.3)
Total Bilirubin: 1.1 mg/dL (ref 0.2–1.2)
Total Protein: 6.6 g/dL (ref 6.0–8.3)

## 2024-08-27 LAB — TSH: TSH: 1.04 u[IU]/mL (ref 0.35–5.50)

## 2024-08-27 MED ORDER — AMOXICILLIN-POT CLAVULANATE 875-125 MG PO TABS
1.0000 | ORAL_TABLET | Freq: Two times a day (BID) | ORAL | 0 refills | Status: DC
  Filled 2024-08-27: qty 20, 10d supply, fill #0

## 2024-08-27 MED ORDER — SERTRALINE HCL 100 MG PO TABS
100.0000 mg | ORAL_TABLET | Freq: Every day | ORAL | 1 refills | Status: AC
  Filled 2024-08-27 – 2024-11-02 (×2): qty 90, 90d supply, fill #0

## 2024-08-27 MED ORDER — ROSUVASTATIN CALCIUM 10 MG PO TABS
10.0000 mg | ORAL_TABLET | Freq: Every day | ORAL | 0 refills | Status: AC
Start: 2024-08-27 — End: ?
  Filled 2024-08-27 – 2024-10-10 (×2): qty 90, 90d supply, fill #0

## 2024-08-27 NOTE — Patient Instructions (Signed)

## 2024-08-27 NOTE — Progress Notes (Signed)
 Subjective:  Patient ID: Rickey Cochran, male    DOB: 02-13-1961  Age: 63 y.o. MRN: [id number]  CC: URI   HPI Rickey Cochran presents for f/up ---  Discussed the use of AI scribe software for clinical note transcription with the patient, who gave verbal consent to proceed.  History of Present Illness Rickey Cochran is a 63 year old male who presents with persistent respiratory symptoms following COVID-19 exposure.  He has been experiencing ongoing symptoms of sniffing, cough, and mucus production for the past two weeks following exposure to COVID-19. Symptoms are present in both his nose and chest, with congestion being worse in the morning until he 'coughs it all up'.  No shortness of breath, fever, chills, dizziness, or lightheadedness. He experiences a little bit of night sweats and significant fatigue, noting 'not a whole lot of energy'. There is no blood in his mucus.  He has not received a COVID vaccine in the last few months but has had a flu shot recently. He is currently taking ibuprofen  for symptom relief.     Outpatient Medications Prior to Visit  Medication Sig Dispense Refill   acetaminophen  (TYLENOL ) 500 MG tablet Take 1,000 mg by mouth as needed (pain).     ibuprofen  (ADVIL ) 200 MG tablet Take 400-800 mg by mouth as needed.     metoprolol  succinate (TOPROL  XL) 25 MG 24 hr tablet Take 1 tablet  by mouth daily. 90 tablet 2   levothyroxine  (SYNTHROID ) 88 MCG tablet Take 1 tablet (88 mcg total) by mouth every morning on an empty stomach 90 tablet 1   rosuvastatin  (CRESTOR ) 10 MG tablet Take 1 tablet (10 mg total) by mouth daily. 90 tablet 3   sertraline  (ZOLOFT ) 100 MG tablet Take 1 tablet (100 mg total) by mouth at bedtime. 90 tablet 1   No facility-administered medications prior to visit.    ROS Review of Systems  Constitutional:  Positive for fatigue. Negative for appetite change, chills, diaphoresis, fever and unexpected weight change.  HENT:   Positive for postnasal drip, rhinorrhea and sore throat. Negative for congestion, sinus pain and trouble swallowing.   Respiratory:  Positive for apnea and cough. Negative for chest tightness, shortness of breath and wheezing.   Cardiovascular:  Negative for chest pain, palpitations and leg swelling.  Gastrointestinal:  Negative for abdominal pain, blood in stool, constipation, diarrhea, nausea and vomiting.  Genitourinary: Negative.  Negative for difficulty urinating, dysuria and frequency.  Musculoskeletal:  Positive for arthralgias. Negative for myalgias.  Skin: Negative.   Neurological: Negative.  Negative for dizziness, syncope, speech difficulty, weakness and light-headedness.  Hematological:  Negative for adenopathy. Does not bruise/bleed easily.  Psychiatric/Behavioral: Negative.      Objective:  BP 118/74 (BP Location: Left Arm, Patient Position: Sitting, Cuff Size: Normal)   Pulse (!) 57   Temp 98.5 F (36.9 C) (Oral)   Resp 16   Ht 6' 2 (1.88 m)   Wt 226 lb 6.4 oz (102.7 kg)   SpO2 96%   BMI 29.07 kg/m   BP Readings from Last 3 Encounters:  08/27/24 118/74  02/15/24 128/78  02/07/24 112/73    Wt Readings from Last 3 Encounters:  08/27/24 226 lb 6.4 oz (102.7 kg)  02/15/24 224 lb 9.6 oz (101.9 kg)  02/07/24 228 lb (103.4 kg)    Physical Exam Vitals reviewed.  Constitutional:      General: He is not in acute distress.    Appearance: Normal appearance. He  is not toxic-appearing or diaphoretic.  HENT:     Nose: Nose normal.     Mouth/Throat:     Mouth: Mucous membranes are moist.     Pharynx: Posterior oropharyngeal erythema present. No oropharyngeal exudate.  Eyes:     General: No scleral icterus.    Conjunctiva/sclera: Conjunctivae normal.  Neck:     Thyroid : No thyroid  mass, thyromegaly or thyroid  tenderness.  Cardiovascular:     Rate and Rhythm: Regular rhythm. Bradycardia present. Occasional Extrasystoles are present.    Pulses: Normal pulses.      Heart sounds: No murmur heard.    No friction rub. No gallop.     Comments: EKG ---- SB, 56 bpm with PAC's No LVH, Q waves, or ST/T wave changes  Pulmonary:     Effort: Pulmonary effort is normal.     Breath sounds: No stridor. No wheezing, rhonchi or rales.  Abdominal:     General: Abdomen is flat.     Palpations: There is no mass.     Tenderness: There is no abdominal tenderness. There is no guarding.     Hernia: No hernia is present.  Musculoskeletal:        General: No swelling.     Cervical back: Neck supple.     Right lower leg: No edema.     Left lower leg: No edema.  Lymphadenopathy:     Cervical: No cervical adenopathy.  Skin:    General: Skin is warm and dry.     Findings: No rash.  Neurological:     General: No focal deficit present.     Mental Status: He is alert. Mental status is at baseline.  Psychiatric:        Mood and Affect: Mood normal.        Behavior: Behavior normal.     Lab Results  Component Value Date   WBC 8.0 08/27/2024   HGB 15.9 08/27/2024   HCT 46.9 08/27/2024   PLT 216.0 08/27/2024   GLUCOSE 87 08/27/2024   CHOL 166 02/15/2024   TRIG 96.0 02/15/2024   HDL 48.20 02/15/2024   LDLDIRECT 151.0 02/17/2021   LDLCALC 99 02/15/2024   ALT 32 08/27/2024   AST 27 08/27/2024   NA 144 08/27/2024   K 4.2 08/27/2024   CL 105 08/27/2024   CREATININE 0.79 08/27/2024   BUN 13 08/27/2024   CO2 25 08/27/2024   TSH 1.04 08/27/2024   PSA 1.49 08/27/2024   HGBA1C 5.3 05/23/2019    US  THYROID  Result Date: 07/11/2023 CLINICAL DATA:  Other. History of thyroid  cancer status post right hemithyroidectomy EXAM: THYROID  ULTRASOUND TECHNIQUE: Ultrasound examination of the thyroid  gland and adjacent soft tissues was performed. COMPARISON:  Prior thyroid  ultrasound 04/27/2021; 08/25/2018 FINDINGS: Parenchymal Echotexture: Mildly heterogenous Isthmus: Surgically absent Right lobe: Surgically absent Left lobe: 5.3 x 2.2 x 2.0 cm  _________________________________________________________ Estimated total number of nodules >/= 1 cm: 1 Number of spongiform nodules >/=  2 cm not described below (TR1): 0 Number of mixed cystic and solid nodules >/= 1.5 cm not described below (TR2): 0 _________________________________________________________ Surgical changes of prior right hemithyroidectomy. However, there is developing homogeneous tissue in the right thyroid  resection bed which measures 2.0 x 1.1 x 1.9 cm. This is slightly enlarged compared to 1.7 x 1.0 cm previously. There is internal vascular flow. The imaging appearance is most consistent with recurrent thyroid  tissue. No discrete nodules. Nodule # 1: Small circumscribed hypoechoic nodule in the left lower gland measures 1.0 x  0.8 x 0.7 cm, unchanged compared to prior. This exam marks 5 years of stability dating back to 2019. IMPRESSION: 1. Surgical changes of prior right hemithyroidectomy with a small amount of recurrent thyroid  tissue in the right thyroid  resection bed which has incrementally enlarged compared to 2022. No recurrent nodules or suspicious features. 2. Confirmed 5 year stability of small 1 cm TI-RADS category 4 nodule in the left lower gland. This lesion can now be considered benign, no further follow-up imaging is recommended. 3. No new nodules. The above is in keeping with the ACR TI-RADS recommendations - J Am Coll Radiol 2017;14:587-595. Electronically Signed   By: Wilkie Lent M.D.   On: 07/11/2023 07:07   DG Chest 2 View Result Date: 08/27/2024 CLINICAL DATA:  Cough and congestion.  Shortness of breath. EXAM: CHEST - 2 VIEW COMPARISON:  03/16/2023 FINDINGS: The cardiomediastinal contours are normal. The lungs are clear. Pulmonary vasculature is normal. No consolidation, pleural effusion, or pneumothorax. Remote right rib fractures. No acute osseous abnormalities are seen. Battery pack for submandibular stimulator on the right. IMPRESSION: No acute findings or  explanation for cough. Electronically Signed   By: Andrea Gasman M.D.   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 -     Basic metabolic panel with GFR; Future -     CBC with Differential/Platelet; Future -     TSH; Future -     Hepatic function panel; Future -     Levothyroxine  Sodium; Take 1 tablet (88 mcg total) by mouth every morning on an empty stomach  Dispense: 90 tablet; Refill: 1  Need for immunization against influenza -     Flu vaccine trivalent PF, 6mos and older(Flulaval ,Afluria,Fluarix ,Fluzone)  BPH associated with nocturia -     Basic metabolic panel with GFR; Future -     CBC with Differential/Platelet; Future -     PSA; Future -     Hepatic function panel; Future  Screen for colon cancer -     Ambulatory referral to Gastroenterology  Dyslipidemia, goal LDL below 100- LDL goal achieved. Doing well on the statin  -     Basic metabolic panel with GFR; Future -     CBC with Differential/Platelet; Future -     Hepatic function panel; Future -     Rosuvastatin  Calcium ; Take 1 tablet (10 mg total) by mouth daily.  Dispense: 90 tablet; Refill: 0  Subacute cough -     DG Chest 2 View; Future  Bradycardia -     EKG 12-Lead  Depression with anxiety -     Sertraline  HCl; Take 1 tablet (100 mg total) by mouth at bedtime.  Dispense: 90 tablet; Refill: 1  LRTI (lower respiratory tract infection) -     Amoxicillin -Pot Clavulanate; Take 1 tablet by mouth 2 (two) times daily for 10 days.  Dispense: 20 tablet; Refill: 0     Follow-up: Return if symptoms worsen or fail to improve.  Debby Molt, MD

## 2024-08-28 ENCOUNTER — Other Ambulatory Visit (HOSPITAL_COMMUNITY): Payer: Self-pay

## 2024-08-28 ENCOUNTER — Encounter: Payer: Self-pay | Admitting: Internal Medicine

## 2024-08-28 ENCOUNTER — Ambulatory Visit
Admission: RE | Admit: 2024-08-28 | Discharge: 2024-08-28 | Disposition: A | Source: Ambulatory Visit | Attending: Physician Assistant | Admitting: Physician Assistant

## 2024-08-28 DIAGNOSIS — M7602 Gluteal tendinitis, left hip: Secondary | ICD-10-CM | POA: Diagnosis not present

## 2024-08-28 DIAGNOSIS — M25552 Pain in left hip: Secondary | ICD-10-CM

## 2024-08-28 MED ORDER — LEVOTHYROXINE SODIUM 88 MCG PO TABS
88.0000 ug | ORAL_TABLET | Freq: Every morning | ORAL | 1 refills | Status: AC
  Filled 2024-08-28: qty 90, 90d supply, fill #0
  Filled 2024-12-07: qty 90, 90d supply, fill #1

## 2024-09-10 ENCOUNTER — Ambulatory Visit: Admitting: Physician Assistant

## 2024-09-10 ENCOUNTER — Other Ambulatory Visit: Payer: Self-pay | Admitting: Internal Medicine

## 2024-09-10 DIAGNOSIS — M1612 Unilateral primary osteoarthritis, left hip: Secondary | ICD-10-CM | POA: Diagnosis not present

## 2024-09-10 NOTE — Progress Notes (Signed)
 HPI: Rickey Cochran returns today to go over the MRI of his left hip.  Imaging was performed on 08/30/2024.  He continues to have 7 out of 10 pain in his left hip at worst.  He is not able to hike like he would like to or walk for exercise as he would like to due to the pain in the left hip groin.  The hip pain also awakens him at night.  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  Review of systems: Denies any shortness of breath or chest pain, fevers chills, ongoing infections.  Physical exam: General: Well-developed well-nourished male in no acute distress.  No assistive device to ambulate. Bilateral hips good range of motion of the right hip left hip he has pain with internal rotation.  Left hip internal rotation slightly limited.  Full external rotation left hip without pain.  Impression: Left hip arthritis  Plan: Discussed with him treatments.  He can continue to modify his activities and take over the counter medications for the hip pain, intra-articular injection or surgical intervention.  He states that he would like to proceed with surgery given the fact that his hip pain is affecting his quality of life despite the oral medications.  He does not want to wait until later in life to have a left total hip arthroplasty.  Therefore risk benefits surgery discussed with him.  Risk include but are not limited to DVT/PE, wound healing problems, leg length discrepancy, nerve/vessel injury, and infection.  Will work on setting up for surgery in the near future.  Questions were encouraged and answered.

## 2024-09-11 ENCOUNTER — Other Ambulatory Visit: Payer: Self-pay

## 2024-09-11 ENCOUNTER — Other Ambulatory Visit (HOSPITAL_COMMUNITY): Payer: Self-pay

## 2024-09-11 MED ORDER — METOPROLOL SUCCINATE ER 25 MG PO TB24
25.0000 mg | ORAL_TABLET | Freq: Every day | ORAL | 0 refills | Status: DC
  Filled 2024-09-11: qty 30, 30d supply, fill #0

## 2024-09-18 DIAGNOSIS — L814 Other melanin hyperpigmentation: Secondary | ICD-10-CM | POA: Diagnosis not present

## 2024-09-18 DIAGNOSIS — L821 Other seborrheic keratosis: Secondary | ICD-10-CM | POA: Diagnosis not present

## 2024-09-18 DIAGNOSIS — D1801 Hemangioma of skin and subcutaneous tissue: Secondary | ICD-10-CM | POA: Diagnosis not present

## 2024-09-20 ENCOUNTER — Ambulatory Visit

## 2024-09-20 ENCOUNTER — Other Ambulatory Visit (HOSPITAL_COMMUNITY): Payer: Self-pay

## 2024-09-20 VITALS — Ht 74.0 in | Wt 220.0 lb

## 2024-09-20 DIAGNOSIS — Z1211 Encounter for screening for malignant neoplasm of colon: Secondary | ICD-10-CM

## 2024-09-20 MED ORDER — NA SULFATE-K SULFATE-MG SULF 17.5-3.13-1.6 GM/177ML PO SOLN
1.0000 | Freq: Once | ORAL | 0 refills | Status: DC
  Filled 2024-09-20 – 2024-10-03 (×2): qty 354, 1d supply, fill #0

## 2024-09-20 NOTE — Progress Notes (Signed)
 No egg or soy allergy known to patient  No issues known to pt with past sedation with any surgeries or procedures Patient denies ever being told they had issues or difficulty with intubation  No FH of Malignant Hyperthermia Pt is not on diet pills Pt is not on  home 02 Pt is not on blood thinners  Pt denies issues with constipation  Hx of Afib  Have any cardiac testing pending--no  LOA: independent  Prep: suprep    PV completed with patient. Prep instructions sent via mychart and home address.

## 2024-09-24 ENCOUNTER — Encounter: Payer: Self-pay | Admitting: Radiology

## 2024-09-26 ENCOUNTER — Ambulatory Visit: Admitting: Orthopaedic Surgery

## 2024-09-26 NOTE — Progress Notes (Signed)
 Sent message, via epic in basket, requesting orders in epic from [facility].

## 2024-09-27 ENCOUNTER — Other Ambulatory Visit: Payer: Self-pay | Admitting: Physician Assistant

## 2024-09-27 ENCOUNTER — Encounter: Payer: Self-pay | Admitting: Internal Medicine

## 2024-09-27 DIAGNOSIS — Z01818 Encounter for other preprocedural examination: Secondary | ICD-10-CM

## 2024-09-29 ENCOUNTER — Other Ambulatory Visit (HOSPITAL_COMMUNITY): Payer: Self-pay

## 2024-10-02 NOTE — Patient Instructions (Signed)
 SURGICAL WAITING ROOM VISITATION  Patients having surgery or a procedure may have no more than 2 support people in the waiting area - these visitors may rotate.    Children under the age of 51 must have an adult with them who is not the patient.  Visitors with respiratory illnesses are discouraged from visiting and should remain at home.  If the patient needs to stay at the hospital during part of their recovery, the visitor guidelines for inpatient rooms apply. Pre-op nurse will coordinate an appropriate time for 1 support person to accompany patient in pre-op.  This support person may not rotate.    Please refer to the [facility] website for the visitor guidelines for Inpatients (after your surgery is over and you are in a regular room).    Your procedure is scheduled on: 10/12/24   Report to [facility] Main Entrance    Report to admitting at 11:00 AM   Call this number if you have problems the morning of surgery [phone]   Do not eat food :After Midnight.   After Midnight you may have the following liquids until 10:30 AM DAY OF SURGERY  Water  Non-Citrus Juices (without pulp, NO RED-Apple, White grape, White cranberry) Black Coffee (NO MILK/CREAM OR CREAMERS, sugar ok)  Clear Tea (NO MILK/CREAM OR CREAMERS, sugar ok) regular and decaf                             Plain Jell-O (NO RED)                                           Fruit ices (not with fruit pulp, NO RED)                                     Popsicles (NO RED)                                                               Sports drinks like Gatorade (NO RED)                 The day of surgery:  Drink ONE (1) Pre-Surgery Clear Ensure at 10:30 AM the morning of surgery. Drink in one sitting. Do not sip.  This drink was given to you during your hospital  pre-op appointment visit. Nothing else to drink after completing the  Pre-Surgery Clear Ensure.          If you have questions, please contact your  surgeon's office.   FOLLOW BOWEL PREP AND ANY ADDITIONAL PRE OP INSTRUCTIONS YOU RECEIVED FROM YOUR SURGEON'S OFFICE!!!     Oral Hygiene is also important to reduce your risk of infection.                                    Remember - BRUSH YOUR TEETH THE MORNING OF SURGERY WITH YOUR REGULAR TOOTHPASTE  DENTURES WILL BE REMOVED PRIOR TO SURGERY PLEASE DO NOT APPLY Poly grip OR  ADHESIVES!!!   Stop all vitamins and herbal supplements 7 days before surgery.   Take these medicines the morning of surgery with A SIP OF WATER : Tylenol , Levothyroxine , Metoprolol , Rosuvastatin               You may not have any metal on your body including jewelry, and body piercing             Do not wear lotions, powders, cologne, or deodorant              Men may shave face and neck.   Do not bring valuables to the hospital. [facility] IS NOT             RESPONSIBLE   FOR VALUABLES.   Contacts, glasses, dentures or bridgework may not be worn into surgery.   Bring small overnight bag day of surgery.   DO NOT BRING YOUR HOME MEDICATIONS TO THE HOSPITAL. PHARMACY WILL DISPENSE MEDICATIONS LISTED ON YOUR MEDICATION LIST TO YOU DURING YOUR ADMISSION IN THE HOSPITAL!              Please read over the following fact sheets you were given: IF YOU HAVE QUESTIONS ABOUT YOUR PRE-OP INSTRUCTIONS PLEASE CALL [phone] [address] [place].   If you received a COVID test during your pre-op visit  it is requested that you wear a mask when out in public, stay away from anyone that may not be feeling well and notify your surgeon if you develop symptoms. If you test positive for Covid or have been in contact with anyone that has tested positive in the last 10 days please notify you surgeon.      Pre-operative 4 CHG Bath Instructions  DYNA-Hex 4 Chlorhexidine  Gluconate 4% Solution Antiseptic 4 fl. oz   You can play a key role in reducing the risk of infection after surgery. Your skin needs to be as free of germs as  possible. You can reduce the number of germs on your skin by washing with CHG (chlorhexidine  gluconate) soap before surgery. CHG is an antiseptic soap that kills germs and continues to kill germs even after washing.   DO NOT use if you have an allergy to chlorhexidine /CHG or antibacterial soaps. If your skin becomes reddened or irritated, stop using the CHG and notify one of our RNs at   Please shower with the CHG soap starting 4 days before surgery using the following schedule:     Please keep in mind the following:  DO NOT shave, including legs and underarms, starting the day of your first shower.   You may shave your face at any point before/day of surgery.  Place clean sheets on your bed the day you start using CHG soap. Use a clean washcloth (not used since being washed) for each shower. DO NOT sleep with pets once you start using the CHG.  CHG Shower Instructions:  If you choose to wash your hair and private area, wash first with your normal shampoo/soap.  After you use shampoo/soap, rinse your hair and body thoroughly to remove shampoo/soap residue.  Turn the water  OFF and apply about 3 tablespoons (45 ml) of CHG soap to a CLEAN washcloth.  Apply CHG soap ONLY FROM YOUR NECK DOWN TO YOUR TOES (washing for 3-5 minutes)  DO NOT use CHG soap on face, private areas, open wounds, or sores.  Pay special attention to the area where your surgery is being performed.  If you are having back surgery, having someone wash your back  for you may be helpful. Wait 2 minutes after CHG soap is applied, then you may rinse off the CHG soap.  Pat dry with a clean towel  Put on clean clothes/pajamas   If you choose to wear lotion, please use ONLY the CHG-compatible lotions on the back of this paper.     Additional instructions for the day of surgery: DO NOT APPLY any lotions, deodorants, cologne, or perfumes.   Put on clean/comfortable clothes.  Brush your teeth.  Ask your nurse before applying any  prescription medications to the skin.   CHG Compatible Lotions   Aveeno Moisturizing lotion  Cetaphil Moisturizing Cream  Cetaphil Moisturizing Lotion  Clairol Herbal Essence Moisturizing Lotion, Dry Skin  Clairol Herbal Essence Moisturizing Lotion, Extra Dry Skin  Clairol Herbal Essence Moisturizing Lotion, Normal Skin  Curel Age Defying Therapeutic Moisturizing Lotion with Alpha Hydroxy  Curel Extreme Care Body Lotion  Curel Soothing Hands Moisturizing Hand Lotion  Curel Therapeutic Moisturizing Cream, Fragrance-Free  Curel Therapeutic Moisturizing Lotion, Fragrance-Free  Curel Therapeutic Moisturizing Lotion, Original Formula  Eucerin Daily Replenishing Lotion  Eucerin Dry Skin Therapy Plus Alpha Hydroxy Crme  Eucerin Dry Skin Therapy Plus Alpha Hydroxy Lotion  Eucerin Original Crme  Eucerin Original Lotion  Eucerin Plus Crme Eucerin Plus Lotion  Eucerin TriLipid Replenishing Lotion  Keri Anti-Bacterial Hand Lotion  Keri Deep Conditioning Original Lotion Dry Skin Formula Softly Scented  Keri Deep Conditioning Original Lotion, Fragrance Free Sensitive Skin Formula  Keri Lotion Fast Absorbing Fragrance Free Sensitive Skin Formula  Keri Lotion Fast Absorbing Softly Scented Dry Skin Formula  Keri Original Lotion  Keri Skin Renewal Lotion Keri Silky Smooth Lotion  Keri Silky Smooth Sensitive Skin Lotion  Nivea Body Creamy Conditioning Oil  Nivea Body Extra Enriched Lotion  Nivea Body Original Lotion  Nivea Body Sheer Moisturizing Lotion Nivea Crme  Nivea Skin Firming Lotion  NutraDerm 30 Skin Lotion  NutraDerm Skin Lotion  NutraDerm Therapeutic Skin Cream  NutraDerm Therapeutic Skin Lotion  ProShield Protective Hand Cream  Provon moisturizing lotion  View Pre-Surgery Education Videos:  [email or web address]     Incentive Spirometer  An incentive spirometer is a tool that can help keep your lungs clear and  active. This tool measures how well you are filling your lungs with each breath. Taking long deep breaths may help reverse or decrease the chance of developing breathing (pulmonary) problems (especially infection) following: A long period of time when you are unable to move or be active. BEFORE THE PROCEDURE  If the spirometer includes an indicator to show your best effort, your nurse or respiratory therapist will set it to a desired goal. If possible, sit up straight or lean slightly forward. Try not to slouch. Hold the incentive spirometer in an upright position. INSTRUCTIONS FOR USE  Sit on the edge of your bed if possible, or sit up as far as you can in bed or on a chair. Hold the incentive spirometer in an upright position. Breathe out normally. Place the mouthpiece in your mouth and seal your lips tightly around it. Breathe in slowly and as deeply as possible, raising the piston or the ball toward the top of the column. Hold your breath for 3-5 seconds or for as long as possible. Allow the piston or ball to fall to the bottom of the column. Remove the mouthpiece from your mouth and breathe out normally. Rest for a few seconds and repeat Steps 1 through 7 at least 10 times every 1-2 hours when you are  awake. Take your time and take a few normal breaths between deep breaths. The spirometer may include an indicator to show your best effort. Use the indicator as a goal to work toward during each repetition. After each set of 10 deep breaths, practice coughing to be sure your lungs are clear. If you have an incision (the cut made at the time of surgery), support your incision when coughing by placing a pillow or rolled up towels firmly against it. Once you are able to get out of bed, walk around indoors and cough well. You may stop using the incentive spirometer when instructed by your caregiver.  RISKS AND COMPLICATIONS Take your time so you do not get dizzy or light-headed. If you are in pain,  you may need to take or ask for pain medication before doing incentive spirometry. It is harder to take a deep breath if you are having pain. AFTER USE Rest and breathe slowly and easily. It can be helpful to keep track of a log of your progress. Your caregiver can provide you with a simple table to help with this. If you are using the spirometer at home, follow these instructions: SEEK MEDICAL CARE IF:  You are having difficultly using the spirometer. You have trouble using the spirometer as often as instructed. Your pain medication is not giving enough relief while using the spirometer. You develop fever of 100.5 F (38.1 C) or higher. SEEK IMMEDIATE MEDICAL CARE IF:  You cough up bloody sputum that had not been present before. You develop fever of 102 F (38.9 C) or greater. You develop worsening pain at or near the incision site. MAKE SURE YOU:  Understand these instructions. Will watch your condition. Will get help right away if you are not doing well or get worse. Document Released: 03/21/2007 Document Revised: 01/31/2012 Document Reviewed: 05/22/2007 [facility] Patient Information 2014 [place], [place].   ________________________________________________________________________

## 2024-10-02 NOTE — Progress Notes (Signed)
 Date of COVID positive in last 90 days:  PCP - Debby Molt, MD Cardiologist - Danelle Birmingham, MD LOV 09/22/23 Neurology- Tisha Mar, MD  Chest x-ray - 08/27/24 Epic EKG - 08/27/24 Epic Stress Test - N/A ECHO - 04/28/21 Epic Cardiac Cath - N/A Pacemaker/ICD device last checked:N/A Spinal Cord Stimulator:N/A Submandibular stimulator on right side  Bowel Prep - N/A  Sleep Study - hypoglossal nerve stimulator- patient knows to bring remote CPAP -   Fasting Blood Sugar - N/A Checks Blood Sugar _____ times a day  Last dose of GLP1 agonist-  N/A GLP1 instructions:  Do not take after     Last dose of SGLT-2 inhibitors-  N/A SGLT-2 instructions:  Do not take after     Blood Thinner Instructions: N/A Last dose:   Time: Aspirin  Instructions:N/A Last Dose:  Activity level: Can go up a flight of stairs and perform activities of daily living without stopping and without symptoms of chest pain or shortness of breath.   Anesthesia review: NSVT, PAC, a fib, OSA, HTN  Patient denies shortness of breath, fever, cough and chest pain at PAT appointment  Patient verbalized understanding of instructions that were given to them at the PAT appointment. Patient was also instructed that they will need to review over the PAT instructions again at home before surgery.

## 2024-10-03 ENCOUNTER — Encounter (HOSPITAL_COMMUNITY): Payer: Self-pay

## 2024-10-03 ENCOUNTER — Other Ambulatory Visit: Payer: Self-pay

## 2024-10-03 ENCOUNTER — Other Ambulatory Visit (HOSPITAL_COMMUNITY): Payer: Self-pay

## 2024-10-03 ENCOUNTER — Encounter (HOSPITAL_COMMUNITY)
Admission: RE | Admit: 2024-10-03 | Discharge: 2024-10-03 | Disposition: A | Discharge status: Home / Self Care | Source: Ambulatory Visit | Attending: Orthopaedic Surgery | Admitting: Orthopaedic Surgery

## 2024-10-03 VITALS — BP 111/86 | HR 61 | Temp 98.1°F | Resp 16 | Ht 74.0 in | Wt 218.0 lb

## 2024-10-03 DIAGNOSIS — Z01818 Encounter for other preprocedural examination: Secondary | ICD-10-CM | POA: Diagnosis present

## 2024-10-03 DIAGNOSIS — Z01812 Encounter for preprocedural laboratory examination: Secondary | ICD-10-CM | POA: Diagnosis not present

## 2024-10-03 LAB — BASIC METABOLIC PANEL WITH GFR
Anion gap: 10 (ref 5–15)
BUN: 11 mg/dL (ref 8–23)
CO2: 24 mmol/L (ref 22–32)
Calcium: 9.4 mg/dL (ref 8.9–10.3)
Chloride: 105 mmol/L (ref 98–111)
Creatinine, Ser: 0.82 mg/dL (ref 0.61–1.24)
GFR, Estimated: >60 mL/min (ref >60)
Glucose, Bld: 102 mg/dL — ABNORMAL HIGH (ref 70–99)
Potassium: 4.7 mmol/L (ref 3.5–5.1)
Sodium: 139 mmol/L (ref 135–145)

## 2024-10-03 LAB — TYPE AND SCREEN
ABO/RH(D): A POS
Antibody Screen: NEGATIVE

## 2024-10-03 LAB — SURGICAL PCR SCREEN
MRSA, PCR: NEGATIVE
Staphylococcus aureus: NEGATIVE

## 2024-10-03 LAB — CBC
HCT: 47.9 % (ref 39.0–52.0)
Hemoglobin: 16 g/dL (ref 13.0–17.0)
MCH: 32.1 pg (ref 26.0–34.0)
MCHC: 33.4 g/dL (ref 30.0–36.0)
MCV: 96.2 fL (ref 80.0–100.0)
Platelets: 206 K/uL (ref 150–400)
RBC: 4.98 MIL/uL (ref 4.22–5.81)
RDW: 12.5 % (ref 11.5–15.5)
WBC: 4.7 K/uL (ref 4.0–10.5)
nRBC: 0 % (ref 0.0–0.2)

## 2024-10-04 ENCOUNTER — Ambulatory Visit (AMBULATORY_SURGERY_CENTER): Admitting: Internal Medicine

## 2024-10-04 ENCOUNTER — Encounter: Payer: Self-pay | Admitting: Internal Medicine

## 2024-10-04 VITALS — BP 137/86 | HR 68 | Temp 97.4°F | Resp 14 | Ht 74.0 in | Wt 220.0 lb

## 2024-10-04 DIAGNOSIS — D125 Benign neoplasm of sigmoid colon: Secondary | ICD-10-CM | POA: Diagnosis not present

## 2024-10-04 DIAGNOSIS — D123 Benign neoplasm of transverse colon: Secondary | ICD-10-CM

## 2024-10-04 DIAGNOSIS — K635 Polyp of colon: Secondary | ICD-10-CM | POA: Diagnosis not present

## 2024-10-04 DIAGNOSIS — Z1211 Encounter for screening for malignant neoplasm of colon: Secondary | ICD-10-CM | POA: Diagnosis not present

## 2024-10-04 DIAGNOSIS — E039 Hypothyroidism, unspecified: Secondary | ICD-10-CM | POA: Diagnosis not present

## 2024-10-04 DIAGNOSIS — K5730 Diverticulosis of large intestine without perforation or abscess without bleeding: Secondary | ICD-10-CM

## 2024-10-04 DIAGNOSIS — K648 Other hemorrhoids: Secondary | ICD-10-CM

## 2024-10-04 DIAGNOSIS — F32A Depression, unspecified: Secondary | ICD-10-CM | POA: Diagnosis not present

## 2024-10-04 DIAGNOSIS — D122 Benign neoplasm of ascending colon: Secondary | ICD-10-CM

## 2024-10-04 DIAGNOSIS — I10 Essential (primary) hypertension: Secondary | ICD-10-CM | POA: Diagnosis not present

## 2024-10-04 DIAGNOSIS — G4730 Sleep apnea, unspecified: Secondary | ICD-10-CM | POA: Diagnosis not present

## 2024-10-04 MED ORDER — SODIUM CHLORIDE 0.9 % IV SOLN: 500.0000 mL | INTRAVENOUS | Status: DC

## 2024-10-04 NOTE — Progress Notes (Signed)
 Pt's states no medical or surgical changes since previsit or office visit.

## 2024-10-04 NOTE — Progress Notes (Signed)
 Called to room to assist during endoscopic procedure.  Patient ID and intended procedure confirmed with present staff. Received instructions for my participation in the procedure from the performing physician.

## 2024-10-04 NOTE — Op Note (Signed)
 [facility] Patient Name: Rickey Cochran Procedure Date: 10/04/2024 8:16 AM MRN: [id number] Endoscopist: Norleen SAILOR. Abran , MD, [id number] Age: 63 Referring MD:  Date of Birth: [birth year] Gender: Male Account #: [id number] Procedure:                Colonoscopy with cold snare polypectomy x 3; biopsy                            polypectomy x 1 Indications:              Screening for colorectal malignant neoplasm. The                            patient reports negative colonoscopy with Dr.                            Burnette 10+ years ago as well as negative Cologuard                            testing 5 years ago. Medicines:                Monitored Anesthesia Care Procedure:                Pre-Anesthesia Assessment:                           - Prior to the procedure, a History and Physical                            was performed, and patient medications and                            allergies were reviewed. The patient's tolerance of                            previous anesthesia was also reviewed. The risks                            and benefits of the procedure and the sedation                            options and risks were discussed with the patient.                            All questions were answered, and informed consent                            was obtained. Prior Anticoagulants: The patient has                            taken no anticoagulant or antiplatelet agents. ASA                            Grade Assessment: II - A patient with mild systemic  disease. After reviewing the risks and benefits,                            the patient was deemed in satisfactory condition to                            undergo the procedure.                           After obtaining informed consent, the colonoscope                            was passed under direct vision. Throughout the                            procedure, the patient's blood  pressure, pulse, and                            oxygen saturations were monitored continuously. The                            Olympus Scope SN: [id number] was introduced through                            the anus and advanced to the the cecum, identified                            by appendiceal orifice and ileocecal valve. The                            ileocecal valve, appendiceal orifice, and rectum                            were photographed. The quality of the bowel                            preparation was excellent. The colonoscopy was                            performed without difficulty. The patient tolerated                            the procedure well. The bowel preparation used was                            SUPREP via split dose instruction. Scope In: 8:29:58 AM Scope Out: 8:46:23 AM Scope Withdrawal Time: 0 hours 13 minutes 47 seconds  Total Procedure Duration: 0 hours 16 minutes 25 seconds  Findings:                 A 1 mm polyp was found in the sigmoid colon. The                            polyp was removed with a jumbo cold  forceps.                            Resection and retrieval were complete.                           Three polyps were found in the transverse colon and                            ascending colon. The polyps were 3 to 5 mm in size.                            These polyps were removed with a cold snare.                            Resection and retrieval were complete.                           A few diverticula were found in the colon. Small                            internal hemorrhoids                           The exam was otherwise without abnormality on                            direct and retroflexion views. Complications:            No immediate complications. Estimated blood loss:                            None. Estimated Blood Loss:     Estimated blood loss: none. Impression:               - One 1 mm polyp in the sigmoid colon, removed  with                            a jumbo cold forceps. Resected and retrieved.                           - Three 3 to 5 mm polyps in the transverse colon                            and in the ascending colon, removed with a cold                            snare. Resected and retrieved.                           - Diverticulosis. Small internal hemorrhoids                           - The examination was otherwise normal on direct  and retroflexion views. Recommendation:           - Repeat colonoscopy in 5 years for surveillance.                           - Patient has a contact number available for                            emergencies. The signs and symptoms of potential                            delayed complications were discussed with the                            patient. Return to normal activities tomorrow.                            Written discharge instructions were provided to the                            patient.                           - Resume previous diet.                           - Continue present medications.                           - Await pathology results. Norleen SAILOR. Abran, MD 10/04/2024 9:04:13 AM This report has been signed electronically.

## 2024-10-04 NOTE — Progress Notes (Signed)
 A/o x 3, VSS, good SR's, pleased with anesthesia, report to RN

## 2024-10-04 NOTE — Progress Notes (Signed)
 HISTORY OF PRESENT ILLNESS:  Rickey Cochran is a 63 y.o. male sent today directly for screening colonoscopy.  No complaints  REVIEW OF SYSTEMS:  All non-GI ROS negative except for  Past Medical History:  Diagnosis Date   Arthritis    BPH (benign prostatic hyperplasia)    Cancer (HCC)    s/p partial thyroidectomy   Depression    Dysrhythmia    Hypertension    several years ago was on lisiopril but lost weight and no longer needs   Hypothyroidism    mgd on levothyroxine     Left-sided tinnitus    Premature atrial contraction    was seen on EKG at his PCP approx 8 years ago, denies any symptoms, reports he was a runner before onset of hip pain in feb 2020   Sleep apnea    not using CPAP    Past Surgical History:  Procedure Laterality Date   BACK SURGERY     lumbar fusion   COLONOSCOPY     DRUG INDUCED ENDOSCOPY N/A 10/27/2022   Procedure: DRUG INDUCED SLEEP ENDOSCOPY;  Surgeon: Mable Lenis, MD;  Location: [facility];  Service: ENT;  Laterality: N/A;   IMPLANTATION OF HYPOGLOSSAL NERVE STIMULATOR Right 03/16/2023   Procedure: IMPLANTATION OF HYPOGLOSSAL NERVE STIMULATOR;  Surgeon: Carlie Clark, MD;  Location: [facility];  Service: ENT;  Laterality: Right;   IR THORACENTESIS ASP PLEURAL SPACE W/IMG GUIDE  03/20/2021   JOINT REPLACEMENT Right 04/13/2019   right total hip arthroplasty   THYROID  LOBECTOMY Right 05/09/2018   THYROID  LOBECTOMY Right 05/09/2018   Procedure: RIGHT THYROID  LOBECTOMY;  Surgeon: Rubin Calamity, MD;  Location: [facility] OR;  Service: General;  Laterality: Right;   TOTAL HIP ARTHROPLASTY Right 04/13/2019   Procedure: RIGHT TOTAL HIP ARTHROPLASTY ANTERIOR APPROACH;  Surgeon: Vernetta Lonni GRADE, MD;  Location: [facility];  Service: Orthopedics;  Laterality: Right;   TOTAL KNEE ARTHROPLASTY Left 11/12/2021   Procedure: LEFT TOTAL KNEE ARTHROPLASTY;  Surgeon: Addie Cordella Hamilton, MD;  Location: [facility] OR;  Service: Orthopedics;   Laterality: Left;    Social History Rickey Cochran  reports that he has quit smoking. He has never used smokeless tobacco. He reports that he does not currently use alcohol . He reports that he does not use drugs.  family history includes Arthritis in his mother; Atrial fibrillation in his mother; Heart Problems in his father; Sleep apnea in his father.  No Known Allergies     PHYSICAL EXAMINATION: Vital signs: BP 119/78   Pulse 74   Temp (!) 97.4 F (36.3 C)   Ht 6' 2 (1.88 m)   Wt 220 lb (99.8 kg)   SpO2 97%   BMI 28.25 kg/m  General: Well-developed, well-nourished, no acute distress HEENT: Sclerae are anicteric, conjunctiva pink. Oral mucosa intact Lungs: Clear Heart: Regular Abdomen: soft, nontender, nondistended, no obvious ascites, no peritoneal signs, normal bowel sounds. No organomegaly. Extremities: No edema Psychiatric: alert and oriented x3. Cooperative     ASSESSMENT: Colon cancer screening    PLAN: Screening colonoscopy

## 2024-10-04 NOTE — Patient Instructions (Addendum)
-  Handout on polyps provided -Await pathology results  YOU HAD AN ENDOSCOPIC PROCEDURE TODAY AT THE [facility]:   Refer to the procedure report that was given to you for any specific questions about what was found during the examination.  If the procedure report does not answer your questions, please call your gastroenterologist to clarify.  If you requested that your care partner not be given the details of your procedure findings, then the procedure report has been included in a sealed envelope for you to review at your convenience later.  YOU SHOULD EXPECT: Some feelings of bloating in the abdomen. Passage of more gas than usual.  Walking can help get rid of the air that was put into your GI tract during the procedure and reduce the bloating. If you had a lower endoscopy (such as a colonoscopy or flexible sigmoidoscopy) you may notice spotting of blood in your stool or on the toilet paper. If you underwent a bowel prep for your procedure, you may not have a normal bowel movement for a few days.  Please Note:  You might notice some irritation and congestion in your nose or some drainage.  This is from the oxygen used during your procedure.  There is no need for concern and it should clear up in a day or so.  SYMPTOMS TO REPORT IMMEDIATELY:  Following lower endoscopy (colonoscopy or flexible sigmoidoscopy):  Excessive amounts of blood in the stool  Significant tenderness or worsening of abdominal pains  Swelling of the abdomen that is new, acute  Fever of 100F or higher  For urgent or emergent issues, a gastroenterologist can be reached at any hour by calling [phone]. Do not use MyChart messaging for urgent concerns.    DIET:  We do recommend a small meal at first, but then you may proceed to your regular diet.  Drink plenty of fluids but you should avoid alcoholic beverages for 24 hours.  ACTIVITY:  You should plan to take it easy for the rest of today and you should NOT  DRIVE or use heavy machinery until tomorrow (because of the sedation medicines used during the test).    FOLLOW UP: Our staff will call the number listed on your records the next business day following your procedure.  We will call around 7:15- 8:00 am to check on you and address any questions or concerns that you may have regarding the information given to you following your procedure. If we do not reach you, we will leave a message.     If any biopsies were taken you will be contacted by phone or by letter within the next 1-3 weeks.  Please call us  at [phone] if you have not heard about the biopsies in 3 weeks.    SIGNATURES/CONFIDENTIALITY: You and/or your care partner have signed paperwork which will be entered into your electronic medical record.  These signatures attest to the fact that that the information above on your After Visit Summary has been reviewed and is understood.  Full responsibility of the confidentiality of this discharge information lies with you and/or your care-partner.

## 2024-10-05 ENCOUNTER — Telehealth: Payer: Self-pay | Admitting: *Deleted

## 2024-10-05 NOTE — Telephone Encounter (Signed)
 Attempted f/u phone call. No answer. Left message.

## 2024-10-09 ENCOUNTER — Ambulatory Visit: Payer: Self-pay | Admitting: Internal Medicine

## 2024-10-09 LAB — SURGICAL PATHOLOGY

## 2024-10-10 ENCOUNTER — Other Ambulatory Visit (HOSPITAL_COMMUNITY): Payer: Self-pay

## 2024-10-10 ENCOUNTER — Other Ambulatory Visit: Payer: Self-pay

## 2024-10-10 ENCOUNTER — Other Ambulatory Visit: Payer: Self-pay | Admitting: Internal Medicine

## 2024-10-11 NOTE — H&P (Signed)
 TOTAL HIP ADMISSION H&P  Patient is admitted for left total hip arthroplasty.  Subjective:  Chief Complaint: left hip pain  HPI: Rickey Cochran, 63 y.o. male, has a history of pain and functional disability in the left hip(s) due to arthritis and patient has failed non-surgical conservative treatments for greater than 12 weeks to include NSAID's and/or analgesics, corticosteriod injections, flexibility and strengthening excercises, and activity modification.  Onset of symptoms was gradual starting 1 years ago with gradually worsening course since that time.The patient noted no past surgery on the left hip(s).  Patient currently rates pain in the left hip at 8 out of 10 with activity. Patient has night pain, worsening of pain with activity and weight bearing, pain that interfers with activities of daily living, and pain with passive range of motion. Patient has evidence of periarticular osteophytes, joint space narrowing, and subchondral edema by imaging studies. This condition presents safety issues increasing the risk of falls.  There is no current active infection.  Patient Active Problem List   Diagnosis Date Noted   Need for immunization against influenza 08/27/2024   Subacute cough 08/27/2024   Bradycardia 08/27/2024   LRTI (lower respiratory tract infection) 08/27/2024   Deficiency anemia 02/15/2024   Dyslipidemia, goal LDL below 100 06/30/2023   Achilles tendonitis, bilateral 12/01/2022   Obstructive sleep apnea 10/27/2022   Lumbar stenosis with neurogenic claudication 01/04/2022   NSVT (nonsustained ventricular tachycardia) (HCC) 05/20/2021   Paroxysmal atrial fibrillation (HCC) 05/20/2021   Nontoxic single thyroid  nodule 02/25/2021   Personal history of malignant neoplasm of thyroid  02/25/2021   Thyroid  cancer (HCC) 02/25/2021   Screen for colon cancer 02/20/2021   Chronic sinus bradycardia 02/17/2021   Primary osteoarthritis of both knees 02/17/2021   Routine general medical  examination at health care facility 01/29/2020   Premature atrial contraction    BPH associated with nocturia 05/23/2019   Depression with anxiety 05/23/2019   Postoperative hypothyroidism 05/23/2019   Unilateral primary osteoarthritis, left hip 02/09/2019   Pes planus of both feet 09/11/2015   Past Medical History:  Diagnosis Date   Arthritis    BPH (benign prostatic hyperplasia)    Cancer (HCC)    s/p partial thyroidectomy   Depression    Dysrhythmia    Hypertension    several years ago was on lisiopril but lost weight and no longer needs   Hypothyroidism    mgd on levothyroxine     Left-sided tinnitus    Premature atrial contraction    was seen on EKG at his PCP approx 8 years ago, denies any symptoms, reports he was a runner before onset of hip pain in feb 2020   Sleep apnea    not using CPAP    Past Surgical History:  Procedure Laterality Date   BACK SURGERY     lumbar fusion   COLONOSCOPY     DRUG INDUCED ENDOSCOPY N/A 10/27/2022   Procedure: DRUG INDUCED SLEEP ENDOSCOPY;  Surgeon: Mable Lenis, MD;  Location: [facility];  Service: ENT;  Laterality: N/A;   IMPLANTATION OF HYPOGLOSSAL NERVE STIMULATOR Right 03/16/2023   Procedure: IMPLANTATION OF HYPOGLOSSAL NERVE STIMULATOR;  Surgeon: Carlie Clark, MD;  Location: [facility];  Service: ENT;  Laterality: Right;   IR THORACENTESIS ASP PLEURAL SPACE W/IMG GUIDE  03/20/2021   JOINT REPLACEMENT Right 04/13/2019   right total hip arthroplasty   THYROID  LOBECTOMY Right 05/09/2018   THYROID  LOBECTOMY Right 05/09/2018   Procedure: RIGHT THYROID  LOBECTOMY;  Surgeon: Rubin,  Lynda, MD;  Location: [facility] OR;  Service: General;  Laterality: Right;   TOTAL HIP ARTHROPLASTY Right 04/13/2019   Procedure: RIGHT TOTAL HIP ARTHROPLASTY ANTERIOR APPROACH;  Surgeon: Vernetta Lonni GRADE, MD;  Location: [facility];  Service: Orthopedics;  Laterality: Right;   TOTAL KNEE ARTHROPLASTY Left 11/12/2021    Procedure: LEFT TOTAL KNEE ARTHROPLASTY;  Surgeon: Addie Cordella Hamilton, MD;  Location: [facility] OR;  Service: Orthopedics;  Laterality: Left;    No current facility-administered medications for this encounter.   Current Outpatient Medications  Medication Sig Dispense Refill Last Dose/Taking   acetaminophen  (TYLENOL ) 500 MG tablet Take 1,000 mg by mouth every 6 (six) hours as needed for moderate pain (pain score 4-6).   Taking As Needed   ibuprofen  (ADVIL ) 200 MG tablet Take 400-800 mg by mouth every 6 (six) hours as needed for moderate pain (pain score 4-6).   Taking As Needed   levothyroxine  (SYNTHROID ) 88 MCG tablet Take 1 tablet (88 mcg total) by mouth every morning on an empty stomach 90 tablet 1 Taking   metoprolol  succinate (TOPROL  XL) 25 MG 24 hr tablet Take 1 tablet  by mouth daily. 30 tablet 0 Taking   rosuvastatin  (CRESTOR ) 10 MG tablet Take 1 tablet (10 mg total) by mouth daily. 90 tablet 0 Taking   sertraline  (ZOLOFT ) 100 MG tablet Take 1 tablet (100 mg total) by mouth at bedtime. 90 tablet 1 Taking   No Known Allergies  Social History   Tobacco Use   Smoking status: Former   Smokeless tobacco: Never   Tobacco comments:    QUIT IN 2004  Substance Use Topics   Alcohol  use: Not Currently    Alcohol /week: 0.0 standard drinks of alcohol     Comment: quit    Family History  Problem Relation Age of Onset   Atrial fibrillation Mother    Arthritis Mother    Sleep apnea Father    Heart Problems Father        has a visual merchandiser   Colon cancer Neg Hx    Rectal cancer Neg Hx    Stomach cancer Neg Hx      Review of Systems  Objective:  Physical Exam Vitals reviewed.  Constitutional:      Appearance: Normal appearance. He is normal weight.  HENT:     Head: Normocephalic and atraumatic.  Eyes:     Extraocular Movements: Extraocular movements intact.     Pupils: Pupils are equal, round, and reactive to light.  Cardiovascular:     Rate and Rhythm: Normal rate.     Pulses:  Normal pulses.  Pulmonary:     Effort: Pulmonary effort is normal.     Breath sounds: Normal breath sounds.  Abdominal:     Palpations: Abdomen is soft.  Musculoskeletal:     Cervical back: Normal range of motion and neck supple.     Left hip: Tenderness and bony tenderness present. Decreased range of motion. Decreased strength.  Neurological:     Mental Status: He is alert and oriented to person, place, and time.  Psychiatric:        Behavior: Behavior normal.     Vital signs in last 24 hours:    Labs:   Estimated body mass index is 28.25 kg/m as calculated from the following:   Height as of 10/04/24: 6' 2 (1.88 m).   Weight as of 10/04/24: 99.8 kg.   Imaging Review Plain radiographs and mainly a MRI demonstrate moderate degenerative joint disease of the  left hip(s). The bone quality appears to be excellent for age and reported activity level.      Assessment/Plan:  End stage arthritis, left hip(s)  The patient history, physical examination, clinical judgement of the provider and imaging studies are consistent with end stage degenerative joint disease of the left hip(s) and total hip arthroplasty is deemed medically necessary. The treatment options including medical management, injection therapy, arthroscopy and arthroplasty were discussed at length. The risks and benefits of total hip arthroplasty were presented and reviewed. The risks due to aseptic loosening, infection, stiffness, dislocation/subluxation,  thromboembolic complications and other imponderables were discussed.  The patient acknowledged the explanation, agreed to proceed with the plan and consent was signed. Patient is being admitted for inpatient treatment for surgery, pain control, PT, OT, prophylactic antibiotics, VTE prophylaxis, progressive ambulation and ADL's and discharge planning.The patient is planning to be discharged home with home health services

## 2024-10-12 ENCOUNTER — Ambulatory Visit (HOSPITAL_COMMUNITY): Payer: Self-pay | Admitting: Physician Assistant

## 2024-10-12 ENCOUNTER — Observation Stay (HOSPITAL_COMMUNITY)

## 2024-10-12 ENCOUNTER — Other Ambulatory Visit (HOSPITAL_COMMUNITY): Payer: Self-pay

## 2024-10-12 ENCOUNTER — Other Ambulatory Visit: Payer: Self-pay

## 2024-10-12 ENCOUNTER — Ambulatory Visit (HOSPITAL_COMMUNITY)

## 2024-10-12 ENCOUNTER — Ambulatory Visit (HOSPITAL_COMMUNITY): Admitting: Certified Registered"

## 2024-10-12 ENCOUNTER — Observation Stay (HOSPITAL_COMMUNITY)
Admission: EM | Admit: 2024-10-12 | Discharge: 2024-10-13 | Disposition: A | Discharge status: Home / Self Care | Attending: Orthopaedic Surgery | Admitting: Orthopaedic Surgery

## 2024-10-12 ENCOUNTER — Encounter (HOSPITAL_COMMUNITY)
Admission: EM | Disposition: A | Discharge status: Home / Self Care | Payer: Self-pay | Source: Home / Self Care | Attending: Orthopaedic Surgery

## 2024-10-12 ENCOUNTER — Encounter (HOSPITAL_COMMUNITY): Payer: Self-pay | Admitting: Orthopaedic Surgery

## 2024-10-12 DIAGNOSIS — I480 Paroxysmal atrial fibrillation: Secondary | ICD-10-CM | POA: Diagnosis not present

## 2024-10-12 DIAGNOSIS — I10 Essential (primary) hypertension: Secondary | ICD-10-CM | POA: Diagnosis not present

## 2024-10-12 DIAGNOSIS — Z96641 Presence of right artificial hip joint: Secondary | ICD-10-CM | POA: Insufficient documentation

## 2024-10-12 DIAGNOSIS — Z96652 Presence of left artificial knee joint: Secondary | ICD-10-CM | POA: Insufficient documentation

## 2024-10-12 DIAGNOSIS — M1612 Unilateral primary osteoarthritis, left hip: Principal | ICD-10-CM | POA: Diagnosis present

## 2024-10-12 DIAGNOSIS — Z85850 Personal history of malignant neoplasm of thyroid: Secondary | ICD-10-CM | POA: Insufficient documentation

## 2024-10-12 DIAGNOSIS — Z96642 Presence of left artificial hip joint: Secondary | ICD-10-CM

## 2024-10-12 DIAGNOSIS — M25552 Pain in left hip: Secondary | ICD-10-CM | POA: Diagnosis present

## 2024-10-12 DIAGNOSIS — Z87891 Personal history of nicotine dependence: Secondary | ICD-10-CM

## 2024-10-12 DIAGNOSIS — Z471 Aftercare following joint replacement surgery: Secondary | ICD-10-CM | POA: Diagnosis not present

## 2024-10-12 DIAGNOSIS — Z0189 Encounter for other specified special examinations: Secondary | ICD-10-CM | POA: Diagnosis not present

## 2024-10-12 DIAGNOSIS — Z79899 Other long term (current) drug therapy: Secondary | ICD-10-CM | POA: Diagnosis not present

## 2024-10-12 DIAGNOSIS — E039 Hypothyroidism, unspecified: Secondary | ICD-10-CM | POA: Insufficient documentation

## 2024-10-12 HISTORY — PX: TOTAL HIP ARTHROPLASTY: SHX124

## 2024-10-12 SURGERY — ARTHROPLASTY, HIP, TOTAL, ANTERIOR APPROACH
Anesthesia: Spinal | Site: Hip | Laterality: Left

## 2024-10-12 MED ORDER — BUPIVACAINE IN DEXTROSE 0.75-8.25 % IT SOLN
INTRATHECAL | Status: DC | PRN
  Administered 2024-10-12: 2 mL via INTRATHECAL

## 2024-10-12 MED ORDER — SODIUM CHLORIDE 0.9 % IV SOLN: INTRAVENOUS | Status: DC

## 2024-10-12 MED ORDER — HYDROMORPHONE HCL 1 MG/ML IJ SOLN: 0.2500 mg | INTRAMUSCULAR | Status: DC | PRN

## 2024-10-12 MED ORDER — HYDROMORPHONE HCL 1 MG/ML IJ SOLN: 0.5000 mg | INTRAMUSCULAR | Status: DC | PRN

## 2024-10-12 MED ORDER — CEFAZOLIN SODIUM-DEXTROSE 2-4 GM/100ML-% IV SOLN
2.0000 g | Freq: Four times a day (QID) | INTRAVENOUS | Status: AC
  Administered 2024-10-12 – 2024-10-13 (×2): 2 g via INTRAVENOUS
  Filled 2024-10-12 (×2): qty 100

## 2024-10-12 MED ORDER — METHOCARBAMOL 500 MG PO TABS
500.0000 mg | ORAL_TABLET | Freq: Four times a day (QID) | ORAL | Status: DC | PRN
  Administered 2024-10-12 – 2024-10-13 (×2): 500 mg via ORAL
  Filled 2024-10-12 (×2): qty 1

## 2024-10-12 MED ORDER — ALUM & MAG HYDROXIDE-SIMETH 200-200-20 MG/5ML PO SUSP: 30.0000 mL | ORAL | Status: DC | PRN

## 2024-10-12 MED ORDER — FENTANYL CITRATE (PF) 100 MCG/2ML IJ SOLN
INTRAMUSCULAR | Status: AC
  Filled 2024-10-12: qty 2

## 2024-10-12 MED ORDER — MEPERIDINE HCL 25 MG/ML IJ SOLN: 6.2500 mg | INTRAMUSCULAR | Status: DC | PRN

## 2024-10-12 MED ORDER — OXYCODONE HCL 5 MG PO TABS
10.0000 mg | ORAL_TABLET | ORAL | Status: DC | PRN
  Administered 2024-10-12: 10 mg via ORAL
  Filled 2024-10-12: qty 2

## 2024-10-12 MED ORDER — PANTOPRAZOLE SODIUM 40 MG PO TBEC
40.0000 mg | DELAYED_RELEASE_TABLET | Freq: Every day | ORAL | Status: DC
  Administered 2024-10-12 – 2024-10-13 (×2): 40 mg via ORAL
  Filled 2024-10-12 (×2): qty 1

## 2024-10-12 MED ORDER — CEFAZOLIN SODIUM-DEXTROSE 2-4 GM/100ML-% IV SOLN
2.0000 g | INTRAVENOUS | Status: AC
  Administered 2024-10-12: 2 g via INTRAVENOUS
  Filled 2024-10-12: qty 100

## 2024-10-12 MED ORDER — DEXAMETHASONE SODIUM PHOSPHATE 4 MG/ML IJ SOLN
INTRAMUSCULAR | Status: DC | PRN
  Administered 2024-10-12: 8 mg via INTRAVENOUS

## 2024-10-12 MED ORDER — SERTRALINE HCL 100 MG PO TABS
100.0000 mg | ORAL_TABLET | Freq: Every day | ORAL | Status: DC
  Administered 2024-10-12: 100 mg via ORAL
  Filled 2024-10-12: qty 1

## 2024-10-12 MED ORDER — POVIDONE-IODINE 10 % EX SWAB: 2.0000 | Freq: Once | CUTANEOUS | Status: DC

## 2024-10-12 MED ORDER — MIDAZOLAM HCL 5 MG/5ML IJ SOLN
INTRAMUSCULAR | Status: DC | PRN
  Administered 2024-10-12: 2 mg via INTRAVENOUS

## 2024-10-12 MED ORDER — METOCLOPRAMIDE HCL 5 MG/ML IJ SOLN: 5.0000 mg | Freq: Three times a day (TID) | INTRAMUSCULAR | Status: DC | PRN

## 2024-10-12 MED ORDER — FENTANYL CITRATE (PF) 100 MCG/2ML IJ SOLN
INTRAMUSCULAR | Status: DC | PRN
  Administered 2024-10-12: 50 ug via INTRAVENOUS

## 2024-10-12 MED ORDER — MIDAZOLAM HCL 2 MG/2ML IJ SOLN
INTRAMUSCULAR | Status: AC
Start: 2024-10-12 — End: 2024-10-12
  Filled 2024-10-12: qty 2

## 2024-10-12 MED ORDER — OXYCODONE HCL 5 MG PO TABS
5.0000 mg | ORAL_TABLET | ORAL | Status: DC | PRN
  Administered 2024-10-12: 5 mg via ORAL
  Administered 2024-10-13: 10 mg via ORAL
  Filled 2024-10-12: qty 2
  Filled 2024-10-12: qty 1

## 2024-10-12 MED ORDER — ONDANSETRON HCL 4 MG PO TABS: 4.0000 mg | ORAL_TABLET | Freq: Four times a day (QID) | ORAL | Status: DC | PRN

## 2024-10-12 MED ORDER — DOCUSATE SODIUM 100 MG PO CAPS
100.0000 mg | ORAL_CAPSULE | Freq: Two times a day (BID) | ORAL | Status: DC
  Administered 2024-10-12 – 2024-10-13 (×2): 100 mg via ORAL
  Filled 2024-10-12 (×2): qty 1

## 2024-10-12 MED ORDER — ASPIRIN 81 MG PO CHEW
81.0000 mg | CHEWABLE_TABLET | Freq: Two times a day (BID) | ORAL | Status: DC
  Administered 2024-10-12 – 2024-10-13 (×2): 81 mg via ORAL
  Filled 2024-10-12 (×2): qty 1

## 2024-10-12 MED ORDER — SODIUM CHLORIDE 0.9 % IR SOLN
Status: DC | PRN
  Administered 2024-10-12: 1000 mL

## 2024-10-12 MED ORDER — OXYCODONE HCL 5 MG/5ML PO SOLN: 5.0000 mg | Freq: Once | ORAL | Status: DC | PRN

## 2024-10-12 MED ORDER — DIPHENHYDRAMINE HCL 12.5 MG/5ML PO ELIX: 12.5000 mg | ORAL_SOLUTION | ORAL | Status: DC | PRN

## 2024-10-12 MED ORDER — LACTATED RINGERS IV SOLN: INTRAVENOUS | Status: DC

## 2024-10-12 MED ORDER — OXYCODONE HCL 5 MG PO TABS: 5.0000 mg | ORAL_TABLET | Freq: Once | ORAL | Status: DC | PRN

## 2024-10-12 MED ORDER — ONDANSETRON HCL 4 MG/2ML IJ SOLN: 4.0000 mg | Freq: Four times a day (QID) | INTRAMUSCULAR | Status: DC | PRN

## 2024-10-12 MED ORDER — TRANEXAMIC ACID-NACL 1000-0.7 MG/100ML-% IV SOLN
1000.0000 mg | INTRAVENOUS | Status: AC
  Administered 2024-10-12: 1000 mg via INTRAVENOUS
  Filled 2024-10-12: qty 100

## 2024-10-12 MED ORDER — ACETAMINOPHEN 500 MG PO TABS
1000.0000 mg | ORAL_TABLET | Freq: Once | ORAL | Status: AC
  Administered 2024-10-12: 1000 mg via ORAL
  Filled 2024-10-12: qty 2

## 2024-10-12 MED ORDER — PHENYLEPHRINE HCL-NACL 20-0.9 MG/250ML-% IV SOLN
INTRAVENOUS | Status: DC | PRN
  Administered 2024-10-12: 25 ug/min via INTRAVENOUS

## 2024-10-12 MED ORDER — ROSUVASTATIN CALCIUM 10 MG PO TABS
10.0000 mg | ORAL_TABLET | Freq: Every day | ORAL | Status: DC
  Administered 2024-10-13: 10 mg via ORAL
  Filled 2024-10-12: qty 1

## 2024-10-12 MED ORDER — PHENOL 1.4 % MT LIQD
1.0000 | OROMUCOSAL | Status: DC | PRN
Start: 2024-10-12 — End: 2024-10-13

## 2024-10-12 MED ORDER — MENTHOL 3 MG MT LOZG: 1.0000 | LOZENGE | OROMUCOSAL | Status: DC | PRN

## 2024-10-12 MED ORDER — METOPROLOL SUCCINATE ER 25 MG PO TB24
25.0000 mg | ORAL_TABLET | Freq: Every day | ORAL | Status: DC
  Administered 2024-10-13: 25 mg via ORAL
  Filled 2024-10-12: qty 1

## 2024-10-12 MED ORDER — METHOCARBAMOL 1000 MG/10ML IJ SOLN: 500.0000 mg | Freq: Four times a day (QID) | INTRAMUSCULAR | Status: DC | PRN

## 2024-10-12 MED ORDER — ONDANSETRON HCL 4 MG/2ML IJ SOLN
INTRAMUSCULAR | Status: DC | PRN
  Administered 2024-10-12: 4 mg via INTRAVENOUS

## 2024-10-12 MED ORDER — DROPERIDOL 2.5 MG/ML IJ SOLN: 0.6250 mg | Freq: Once | INTRAMUSCULAR | Status: DC | PRN

## 2024-10-12 MED ORDER — ACETAMINOPHEN 10 MG/ML IV SOLN: 1000.0000 mg | Freq: Once | INTRAVENOUS | Status: DC | PRN

## 2024-10-12 MED ORDER — 0.9 % SODIUM CHLORIDE (POUR BTL) OPTIME
TOPICAL | Status: DC | PRN
  Administered 2024-10-12: 1000 mL

## 2024-10-12 MED ORDER — METOPROLOL SUCCINATE ER 25 MG PO TB24
25.0000 mg | ORAL_TABLET | Freq: Every day | ORAL | 0 refills | Status: DC
  Filled 2024-10-12: qty 15, 15d supply, fill #0

## 2024-10-12 MED ORDER — CHLORHEXIDINE GLUCONATE 0.12 % MT SOLN
15.0000 mL | Freq: Once | OROMUCOSAL | Status: AC
  Administered 2024-10-12: 15 mL via OROMUCOSAL

## 2024-10-12 MED ORDER — ORAL CARE MOUTH RINSE: 15.0000 mL | Freq: Once | OROMUCOSAL | Status: AC

## 2024-10-12 MED ORDER — METOCLOPRAMIDE HCL 5 MG PO TABS: 5.0000 mg | ORAL_TABLET | Freq: Three times a day (TID) | ORAL | Status: DC | PRN

## 2024-10-12 MED ORDER — LEVOTHYROXINE SODIUM 88 MCG PO TABS
88.0000 ug | ORAL_TABLET | Freq: Every morning | ORAL | Status: DC
  Administered 2024-10-13: 88 ug via ORAL
  Filled 2024-10-12: qty 1

## 2024-10-12 MED ORDER — STERILE WATER FOR IRRIGATION IR SOLN
Status: DC | PRN
  Administered 2024-10-12: 2000 mL

## 2024-10-12 MED ORDER — ACETAMINOPHEN 325 MG PO TABS
325.0000 mg | ORAL_TABLET | Freq: Four times a day (QID) | ORAL | Status: DC | PRN
  Administered 2024-10-13: 650 mg via ORAL
  Filled 2024-10-12: qty 2

## 2024-10-12 MED ORDER — PROPOFOL 500 MG/50ML IV EMUL
INTRAVENOUS | Status: DC | PRN
  Administered 2024-10-12: 50 mg via INTRAVENOUS
  Administered 2024-10-12: 125 ug/kg/min via INTRAVENOUS
  Administered 2024-10-12: 50 mg via INTRAVENOUS

## 2024-10-12 MED ORDER — LACTATED RINGERS IV SOLN
INTRAVENOUS | Status: DC | PRN
Start: 2024-10-12 — End: 2024-10-12

## 2024-10-12 SURGICAL SUPPLY — 38 items
BAG COUNTER SPONGE SURGICOUNT (BAG) ×1 IMPLANT
BAG ZIPLOCK 12X15 (MISCELLANEOUS) ×1 IMPLANT
BENZOIN TINCTURE PRP APPL 2/3 (GAUZE/BANDAGES/DRESSINGS) IMPLANT
BLADE SAW SGTL 18X1.27X75 (BLADE) ×1 IMPLANT
COVER PERINEAL POST (MISCELLANEOUS) ×1 IMPLANT
COVER SURGICAL LIGHT HANDLE (MISCELLANEOUS) ×1 IMPLANT
CUP ACET PNNCL SECTR W/GRIP 56 (Hips) IMPLANT
DRAPE FOOT SWITCH (DRAPES) ×1 IMPLANT
DRAPE STERI IOBAN 125X83 (DRAPES) ×1 IMPLANT
DRAPE U-SHAPE 47X51 STRL (DRAPES) ×2 IMPLANT
DRSG AQUACEL AG ADV 3.5X10 (GAUZE/BANDAGES/DRESSINGS) ×1 IMPLANT
DURAPREP 26ML APPLICATOR (WOUND CARE) ×1 IMPLANT
ELECT PENCIL ROCKER SW 15FT (MISCELLANEOUS) ×1 IMPLANT
ELECT REM PT RETURN 15FT ADLT (MISCELLANEOUS) ×1 IMPLANT
GAUZE XEROFORM 1X8 LF (GAUZE/BANDAGES/DRESSINGS) IMPLANT
GLOVE BIO SURGEON STRL SZ7.5 (GLOVE) ×1 IMPLANT
GLOVE BIOGEL PI IND STRL 8 (GLOVE) ×2 IMPLANT
GLOVE SURG ORTHO 8.0 STRL STRW (GLOVE) ×1 IMPLANT
GOWN STRL REUS W/ TWL XL LVL3 (GOWN DISPOSABLE) ×2 IMPLANT
HEAD CERAMIC 36 PLUS5 (Hips) IMPLANT
HEAD CERAMIC BIO DELTA 36 (Hips) IMPLANT
HOLDER FOLEY CATH W/STRAP (MISCELLANEOUS) ×1 IMPLANT
KIT TURNOVER KIT A (KITS) ×1 IMPLANT
PACK ANTERIOR HIP CUSTOM (KITS) ×1 IMPLANT
PINNACLE ALTRX PLUS 4 N 36X56 (Hips) IMPLANT
SET HNDPC FAN SPRY TIP SCT (DISPOSABLE) ×1 IMPLANT
STAPLER SKIN PROX 35W (STAPLE) IMPLANT
STEM FEMORAL SZ5 HIGH ACTIS (Stem) IMPLANT
STEM FEMORAL SZ6 HIGH ACTIS (Stem) IMPLANT
STRIP CLOSURE SKIN 1/2X4 (GAUZE/BANDAGES/DRESSINGS) IMPLANT
SUT ETHIBOND NAB CT1 #1 30IN (SUTURE) ×1 IMPLANT
SUT ETHILON 2 0 PS N (SUTURE) IMPLANT
SUT MNCRL AB 4-0 PS2 18 (SUTURE) IMPLANT
SUT VIC AB 0 CT1 36 (SUTURE) ×1 IMPLANT
SUT VIC AB 1 CT1 36 (SUTURE) ×1 IMPLANT
SUT VIC AB 2-0 CT1 TAPERPNT 27 (SUTURE) ×2 IMPLANT
TRAY FOLEY MTR SLVR 16FR STAT (SET/KITS/TRAYS/PACK) ×1 IMPLANT
YANKAUER SUCT BULB TIP NO VENT (SUCTIONS) ×1 IMPLANT

## 2024-10-12 NOTE — Anesthesia Preprocedure Evaluation (Addendum)
 Anesthesia Evaluation  Patient identified by MRN, date of birth, ID band Patient awake    Reviewed: Allergy & Precautions, NPO status , Patient's Chart, lab work & pertinent test results  Airway Mallampati: II  TM Distance: >3 FB Neck ROM: Full    Dental  (+) Dental Advisory Given, Teeth Intact   Pulmonary sleep apnea , former smoker   breath sounds clear to auscultation       Cardiovascular hypertension, Pt. on home beta blockers + dysrhythmias  Rhythm:Regular Rate:Normal     Neuro/Psych  PSYCHIATRIC DISORDERS Anxiety Depression    negative neurological ROS     GI/Hepatic negative GI ROS, Neg liver ROS,,,  Endo/Other  Hypothyroidism    Renal/GU negative Renal ROS     Musculoskeletal  (+) Arthritis ,    Abdominal   Peds  Hematology  (+) Blood dyscrasia, anemia   Anesthesia Other Findings   Reproductive/Obstetrics                              Anesthesia Physical Anesthesia Plan  ASA: 3  Anesthesia Plan: Spinal   Post-op Pain Management: Tylenol  PO (pre-op)*   Induction: Intravenous  PONV Risk Score and Plan: 2 and Ondansetron , Dexamethasone , Midazolam  and Propofol  infusion  Airway Management Planned: Natural Airway and Simple Face Mask  Additional Equipment: None  Intra-op Plan:   Post-operative Plan:   Informed Consent: I have reviewed the patients History and Physical, chart, labs and discussed the procedure including the risks, benefits and alternatives for the proposed anesthesia with the patient or authorized representative who has indicated his/her understanding and acceptance.       Plan Discussed with: CRNA  Anesthesia Plan Comments: (Lab Results      Component                Value               Date                      WBC                      4.7                 10/03/2024                HGB                      16.0                10/03/2024                 HCT                      47.9                10/03/2024                MCV                      96.2                10/03/2024                PLT                      206  10/03/2024           )         Anesthesia Quick Evaluation

## 2024-10-12 NOTE — Plan of Care (Signed)
  Problem: Nutrition: Goal: Adequate nutrition will be maintained Outcome: Progressing   Problem: Pain Managment: Goal: General experience of comfort will improve and/or be controlled Outcome: Progressing   Problem: Safety: Goal: Ability to remain free from injury will improve Outcome: Progressing   Problem: Education: Goal: Knowledge of the prescribed therapeutic regimen will improve Outcome: Progressing

## 2024-10-12 NOTE — Interval H&P Note (Signed)
 History and Physical Interval Note: The patient understands that he is here today for left total hip replacement to treat his significant left hip pain and arthritis.  There has been no acute or interval change in her medical status.  The risks and benefits of surgery have been discussed in detail and informed consent has been obtained.  The left operative hip has been marked.  10/12/2024 12:37 PM  Rickey Cochran  has presented today for surgery, with the diagnosis of Osteoarthritis Left Hip.  The various methods of treatment have been discussed with the patient and family. After consideration of risks, benefits and other options for treatment, the patient has consented to  Procedure(s): ARTHROPLASTY, HIP, TOTAL, ANTERIOR APPROACH (Left) as a surgical intervention.  The patient's history has been reviewed, patient examined, no change in status, stable for surgery.  I have reviewed the patient's chart and labs.  Questions were answered to the patient's satisfaction.     [name]

## 2024-10-12 NOTE — Op Note (Signed)
 Operative Note  Date of operation: 10/12/2024 Preoperative diagnosis: Left hip primary osteoarthritis Postoperative diagnosis: Same  Procedure: Left direct anterior total hip arthroplasty  Implants: Implant Name Type Inv. Item Serial No. Manufacturer Lot No. LRB No. Used Action  CUP ACET PNNCL SECTR W/GRIP 56 - [id number] Hips CUP ACET PNNCL SECTR W/GRIP 56  [employer] [id number] Left 1 Implanted  PINNACLE ALTRX PLUS 4 N [id number] - [id number] Hips PINNACLE ALTRX PLUS 4 N 36X56  [employer]PU[employer] [id number] Left 1 Implanted  STEM FEMORAL SZ6 HIGH ACTIS - [id number] Stem STEM FEMORAL SZ6 HIGH ACTIS  [employer]PU[employer] ORTH[employer] [id number] Left 1 Implanted  HEAD CERAMIC BIO DELTA 36 - [id number] Hips HEAD CERAMIC BIO DELTA 36  [employer] [id number] Left 1 Implanted   Surgeon: Lonni GRADE. Vernetta, MD Assistant: Tory Gaskins, PA-C  Anesthesia: Spinal Antibiotics: IV Ancef  EBL: 500 cc Complications: None  Indications: Rickey Cochran is a very active 63 year old gentleman who has developed debilitating arthritis involving his left hip.  He does have a right total hip arthroplasty that was done through a direct anterior approach back in 2020 and that has done well.  At this point his left hip pain is daily and it is detrimentally fasting his mobility, his quality of life and his actives daily living.  The MRI also of his hip showed significant arthritis of the left hip.  He is ready to proceed with a total hip arthroplasty on the left side.  Having had this before on the right side he is fully aware the risk and benefits of the surgery.  Procedure description: After informed consent obtained provide left hip was marked the patient was brought the operating room and set up in the stretcher where spinal anesthesia was obtained.  He was then laid supine position on on the stretcher and a Foley catheter was placed.  We assessed his leg lengths and then traction boots were placed on both his feet.  Next he was  placed supine on the Hana fracture table with a perineal post and placed in both legs in inline skeletal traction devices but no traction applied.  His left operative hip and pelvis were assessed radiographically.  The left hip was prepped and draped in DuraPrep and sterile drapes.  A timeout is called and he was then applied to correct pressure correct left hip.  We then made incision just inferior and posterior to the ASIS and carried slightly obliquely down the leg.  Dissection was carried down to the tensor fascia lata muscle and tensor fascia was then divided longitudinally to proceed with a direct and approach the hip.  Circumflex vessels were identified and cauterized.  The hip capsule identified and opened up in L-type format finding an effusion.  There was actually labral tearing and a large paralabral cyst.  This was removed.  Cobra retractors were placed around the medial and lateral femoral neck and a femoral neck cut was made with an oscillating saw just proximal to the lesser trochanter and this cut was completed with an osteotome.  A corkscrew guide was placed in the femoral head and the femoral head was removed in its entirety and there was a wide area of significant cartilage wear.  A bent Hohmann was placed over the medial acetabular rim and the asked our labrum and other debris removed.  Reaming was initiated from a size 43 reamer and stepwise increments going to a size 55 with all reamers placed under direct visualization in the last reamer placed under direct fluoroscopy  in order to obtain the depth reaming, the inclination and anteversion.  We then placed the real DePuy sector GRIPTION acetabular component size 56 and we needed to go with a 36+4 polythene liner based on medialization of the acetabular implant.  Incision was then turned to the femur.  With the right leg externally rotated to 140 degrees, extended and adducted, a Mueller retractors placed medially and a Hohmann tractor by the  greater trochanter.  The lateral joint capsule was released and a box cutting osteotome was used in the femoral canal.  Broaching was initiated using the Actis broaching system from a size 0 going to a size 5.  With size 5 in place we trialed a high offset femoral neck and a 36+1.5 trial head ball.  This was reduced in the pelvis and we felt like we just needed some more leg length.  We dislocated the hip and the trial components.  We then placed the real Actis femoral, and a high offset size 5 and with the real 36+5 ceramic head ball.  This reduced the pelvis but on clinical assessment I was not pleased with stability.  At that point we decided to actually remove the femoral component as well felt like we needed more offset as well as leg length based on medialization the femoral component.  We were able to easily remove the size 5 Actis femoral component and broached up to a size 6.  We are pleased with the size 6 in terms of being able to give us  more offset so we placed the real high offset size 6 Actis femoral component without problems or difficulty.  We then went with the real 36+12 ceramic head ball.  This reduced in pelvis and based on radiographic and clinical assessment we are very pleased with offset and leg length as well as stability.  This again was assessed radiographically and mechanically.  The soft tissue was then irrigated with normal saline solution.  The parts of the joint capsule were closed with interrupted #1 Ethibond suture.  A #1 Vicryl was used to close the tensor fascia.  0 Vicryl is used to close deep tissue and 2-0 Vicryl was used to close subcutaneous tissue.  The skin was closed with staples.  An Aquacel dressing was applied.  The patient was taken off the Hana table and taken recovery room.  Tory Gaskins PA-C did assist during the entire case and beginning to end and his assistance was crucial and medically necessary for soft tissue management and retraction, helping guide implant  placement and a layered closure of the wound.

## 2024-10-12 NOTE — Transfer of Care (Signed)
 Immediate Anesthesia Transfer of Care Note  Patient: Rickey Cochran  Procedure(s) Performed: ARTHROPLASTY, HIP, TOTAL, ANTERIOR APPROACH (Left: Hip)  Patient Location: PACU  Anesthesia Type:General  Level of Consciousness: awake and alert   Airway & Oxygen Therapy: Patient Spontanous Breathing and Patient connected to nasal cannula oxygen  Post-op Assessment: Report given to RN and Post -op Vital signs reviewed and stable  Post vital signs: Reviewed and stable  Last Vitals:  Vitals Value Taken Time  BP 80/43 10/12/24 15:53  Temp    Pulse 38 10/12/24 15:55  Resp 13 10/12/24 15:55  SpO2 98 % 10/12/24 15:55  Vitals shown include unfiled device data.  Last Pain:  Vitals:   10/12/24 1105  TempSrc: Oral  PainSc: 0-No pain      Patients Stated Pain Goal: 4 (10/12/24 1105)  Complications: No notable events documented.

## 2024-10-12 NOTE — Anesthesia Procedure Notes (Signed)
 Spinal  Start time: 10/12/2024 2:03 PM End time: 10/12/2024 2:06 PM Reason for block: surgical anesthesia Staffing Performed: anesthesiologist  Anesthesiologist: Tilford Franky BIRCH, MD Performed by: Tilford Franky BIRCH, MD Authorized by: Tilford Franky BIRCH, MD   Preanesthetic Checklist Completed: patient identified, IV checked, site marked, risks and benefits discussed, surgical consent, monitors and equipment checked, pre-op evaluation and timeout performed Spinal Block Patient position: sitting Prep: DuraPrep and site prepped and draped Location: L2-3 Injection technique: single-shot Needle Needle type: Pencan  Needle gauge: 24 G Needle length: 10 cm Needle insertion depth: 10 cm Assessment Events: CSF return Additional Notes Patient tolerated well. No immediate complications.  Functioning IV was confirmed and monitors were applied. Sterile prep and drape, including hand hygiene and sterile gloves were used. The patient was positioned and the back was prepped. The skin was anesthetized with lidocaine . Free flow of clear CSF was obtained prior to injecting local anesthetic into the CSF. The spinal needle aspirated freely following injection. The needle was carefully withdrawn. The patient tolerated the procedure well.

## 2024-10-13 ENCOUNTER — Other Ambulatory Visit (HOSPITAL_COMMUNITY): Payer: Self-pay

## 2024-10-13 DIAGNOSIS — M1612 Unilateral primary osteoarthritis, left hip: Secondary | ICD-10-CM | POA: Diagnosis not present

## 2024-10-13 LAB — CBC
HCT: 38 % — ABNORMAL LOW (ref 39.0–52.0)
Hemoglobin: 12.9 g/dL — ABNORMAL LOW (ref 13.0–17.0)
MCH: 32.3 pg (ref 26.0–34.0)
MCHC: 33.9 g/dL (ref 30.0–36.0)
MCV: 95 fL (ref 80.0–100.0)
Platelets: 163 K/uL (ref 150–400)
RBC: 4 MIL/uL — ABNORMAL LOW (ref 4.22–5.81)
RDW: 12.3 % (ref 11.5–15.5)
WBC: 11.2 K/uL — ABNORMAL HIGH (ref 4.0–10.5)
nRBC: 0 % (ref 0.0–0.2)

## 2024-10-13 LAB — BASIC METABOLIC PANEL WITH GFR
Anion gap: 9 (ref 5–15)
BUN: 15 mg/dL (ref 8–23)
CO2: 26 mmol/L (ref 22–32)
Calcium: 8.6 mg/dL — ABNORMAL LOW (ref 8.9–10.3)
Chloride: 103 mmol/L (ref 98–111)
Creatinine, Ser: 0.83 mg/dL (ref 0.61–1.24)
GFR, Estimated: >60 mL/min (ref >60)
Glucose, Bld: 175 mg/dL — ABNORMAL HIGH (ref 70–99)
Potassium: 4.1 mmol/L (ref 3.5–5.1)
Sodium: 138 mmol/L (ref 135–145)

## 2024-10-13 MED ORDER — METHOCARBAMOL 500 MG PO TABS
500.0000 mg | ORAL_TABLET | Freq: Four times a day (QID) | ORAL | 0 refills | Status: AC | PRN
  Filled 2024-10-13: qty 30, 8d supply, fill #0

## 2024-10-13 MED ORDER — OXYCODONE HCL 5 MG PO TABS
5.0000 mg | ORAL_TABLET | Freq: Four times a day (QID) | ORAL | 0 refills | Status: DC | PRN
  Filled 2024-10-13: qty 30, 4d supply, fill #0

## 2024-10-13 MED ORDER — ASPIRIN 81 MG PO CHEW
81.0000 mg | CHEWABLE_TABLET | Freq: Two times a day (BID) | ORAL | 0 refills | Status: AC
  Filled 2024-10-13: qty 30, 15d supply, fill #0

## 2024-10-13 MED ORDER — IBUPROFEN 800 MG PO TABS
800.0000 mg | ORAL_TABLET | Freq: Three times a day (TID) | ORAL | 0 refills | Status: DC | PRN
  Filled 2024-10-13: qty 30, 10d supply, fill #0

## 2024-10-13 NOTE — Evaluation (Signed)
 Physical Therapy Evaluation Patient Details Name: Rickey Cochran MRN: [id number] DOB: 08/17/1961 Today's Date: 10/13/2024  History of Present Illness  Pt s/p L THR and with hx of R THR, L TKR, back surgery and thyroid  CA.  Clinical Impression  Pt admitted as above and presenting with functional mobility limitations 2* decreased  L LE strength/ROM, post op pain and limited activity tolerance.  Pt should progress to dc home with family assist.        If plan is discharge home, recommend the following:     Can travel by private vehicle        Equipment Recommendations None recommended by PT  Recommendations for Other Services       Functional Status Assessment Patient has had a recent decline in their functional status and demonstrates the ability to make significant improvements in function in a reasonable and predictable amount of time.     Precautions / Restrictions Precautions Precautions: Fall Restrictions Weight Bearing Restrictions Per Provider Order: No Other Position/Activity Restrictions: WBAT      Mobility  Bed Mobility Overal bed mobility: Needs Assistance Bed Mobility: Rolling, Supine to Sit     Supine to sit: Contact guard     General bed mobility comments: cues for sequence, assist of gait belt to manage L LE    Transfers Overall transfer level: Needs assistance Equipment used: Rolling walker (2 wheels) Transfers: Sit to/from Stand Sit to Stand: Contact guard assist           General transfer comment: cues for LE management and use of UEs to self assist    Ambulation/Gait Ambulation/Gait assistance: Min assist, Contact guard assist Gait Distance (Feet): 12 Feet Assistive device: Rolling walker (2 wheels) Gait Pattern/deviations: Step-to pattern, Decreased step length - right, Decreased step length - left, Shuffle, Trunk flexed Gait velocity: decr     General Gait Details: cues for sequence, posture and position from RW - distance ltd  by c/o fatigue and feeling very hot - BP 114/85  Stairs            Wheelchair Mobility     Tilt Bed    Modified Rankin (Stroke Patients Only)       Balance Overall balance assessment: Needs assistance Sitting-balance support: No upper extremity supported, Feet supported Sitting balance-Leahy Scale: Good     Standing balance support: Bilateral upper extremity supported Standing balance-Leahy Scale: Poor                               Pertinent Vitals/Pain Pain Assessment Pain Assessment: 0-10 Pain Score: 5  Pain Location: L hip Pain Descriptors / Indicators: Aching, Burning, Sore Pain Intervention(s): Limited activity within patient's tolerance, Monitored during session, Premedicated before session    Home Living Family/patient expects to be discharged to:: Private residence Living Arrangements: Spouse/significant other Available Help at Discharge: Family Type of Home: House Home Access: Stairs to enter Entrance Stairs-Rails: None Entrance Stairs-Number of Steps: 2 Alternate Level Stairs-Number of Steps: 1 flight Home Layout: Two level;Able to live on main level with bedroom/bathroom Home Equipment: None;Rolling Walker (2 wheels);BSC/3in1      Prior Function Prior Level of Function : Independent/Modified Independent                     Extremity/Trunk Assessment   Upper Extremity Assessment Upper Extremity Assessment: Overall WFL for tasks assessed    Lower Extremity Assessment Lower Extremity Assessment: LLE  deficits/detail LLE Deficits / Details: AAROM at hip to 75 flex and 20 abd; 2+/5 strength at hip    Cervical / Trunk Assessment Cervical / Trunk Assessment: Normal  Communication   Communication Communication: No apparent difficulties    Cognition Arousal: Alert Behavior During Therapy: WFL for tasks assessed/performed   PT - Cognitive impairments: No apparent impairments                         Following  commands: Intact       Cueing Cueing Techniques: Verbal cues     General Comments      Exercises Total Joint Exercises Ankle Circles/Pumps: AROM, Both, 15 reps, Supine Quad Sets: AROM, Both, 10 reps, Supine Heel Slides: AAROM, 20 reps, Supine, Left Hip ABduction/ADduction: AAROM, Left, 15 reps, Supine   Assessment/Plan    PT Assessment Patient needs continued PT services  PT Problem List Decreased strength;Decreased range of motion;Decreased activity tolerance;Decreased balance;Decreased mobility;Decreased knowledge of use of DME;Pain       PT Treatment Interventions DME instruction;Gait training;Stair training;Functional mobility training;Therapeutic activities;Therapeutic exercise;Patient/family education;Balance training    PT Goals (Current goals can be found in the Care Plan section)  Acute Rehab PT Goals Patient Stated Goal: Regain IND PT Goal Formulation: With patient Time For Goal Achievement: 10/20/24 Potential to Achieve Goals: Good    Frequency 7X/week     Co-evaluation               AM-PAC PT 6 Clicks Mobility  Outcome Measure Help needed turning from your back to your side while in a flat bed without using bedrails?: A Little Help needed moving from lying on your back to sitting on the side of a flat bed without using bedrails?: A Little Help needed moving to and from a bed to a chair (including a wheelchair)?: A Little Help needed standing up from a chair using your arms (e.g., wheelchair or bedside chair)?: A Little Help needed to walk in hospital room?: A Little Help needed climbing 3-5 steps with a railing? : A Lot 6 Click Score: 17    End of Session Equipment Utilized During Treatment: Gait belt Activity Tolerance: Patient tolerated treatment well Patient left: in chair;with call bell/phone within reach;with chair alarm set;with family/visitor present Nurse Communication: Mobility status PT Visit Diagnosis: Difficulty in walking, not  elsewhere classified (R26.2)    Time: 9183-9149 PT Time Calculation (min) (ACUTE ONLY): 34 min   Charges:   PT Evaluation $PT Eval Low Complexity: 1 Low PT Treatments $Therapeutic Exercise: 8-22 mins PT General Charges $$ ACUTE PT VISIT: 1 Visit         [facility] PT Acute Rehabilitation Services Office [phone]   Xandria Gallaga 10/13/2024, 1:39 PM

## 2024-10-13 NOTE — Discharge Instructions (Signed)

## 2024-10-13 NOTE — Discharge Summary (Signed)
 Patient ID: Rickey Cochran MRN: [id number] DOB/AGE: 63-09-1961 63 y.o.  Admit date: 10/12/2024 Discharge date: 10/13/2024  Admission Diagnoses:  Principal Problem:   Unilateral primary osteoarthritis, left hip Active Problems:   Status post total replacement of left hip   Discharge Diagnoses:  Same  Past Medical History:  Diagnosis Date   Arthritis    BPH (benign prostatic hyperplasia)    Cancer (HCC)    s/p partial thyroidectomy   Depression    Dysrhythmia    Hypertension    several years ago was on lisiopril but lost weight and no longer needs   Hypothyroidism    mgd on levothyroxine     Left-sided tinnitus    Premature atrial contraction    was seen on EKG at his PCP approx 8 years ago, denies any symptoms, reports he was a runner before onset of hip pain in feb 2020   Sleep apnea    not using CPAP    Surgeries: Procedure(s): ARTHROPLASTY, HIP, TOTAL, ANTERIOR APPROACH on 10/12/2024   Consultants:   Discharged Condition: Improved  Hospital Course: CLEOTIS SPARR is an 63 y.o. male who was admitted 10/12/2024 for operative treatment ofUnilateral primary osteoarthritis, left hip. Patient has severe unremitting pain that affects sleep, daily activities, and work/hobbies. After pre-op clearance the patient was taken to the operating room on 10/12/2024 and underwent  Procedure(s): ARTHROPLASTY, HIP, TOTAL, ANTERIOR APPROACH.    Patient was given perioperative antibiotics:  Anti-infectives (From admission, onward)    Start     Dose/Rate Route Frequency Ordered Stop   10/12/24 2000  ceFAZolin  (ANCEF ) IVPB 2g/100 mL premix        2 g 200 mL/hr over 30 Minutes Intravenous Every 6 hours 10/12/24 1709 10/13/24 1246   10/12/24 1115  ceFAZolin  (ANCEF ) IVPB 2g/100 mL premix        2 g 200 mL/hr over 30 Minutes Intravenous On call to O.R. 10/12/24 1107 10/12/24 1409        Patient was given sequential compression devices, early ambulation, and chemoprophylaxis to  prevent DVT.  Inpatient Morphine  Milligram Equivalents Per Day 11/21 - 11/22   Values displayed are in units of MME/Day    Order Start / End Date Yesterday Today    oxyCODONE  (Oxy IR/ROXICODONE ) immediate release tablet 5 mg 11/21 - 11/21 0 of Unknown --    oxyCODONE  (ROXICODONE ) 5 MG/5ML solution 5 mg 11/21 - 11/21 0 of Unknown --      Group total: 0 of Unknown     HYDROmorphone  (DILAUDID ) injection 0.25-0.5 mg 11/21 - 11/21 0 of 40-80 --    meperidine  (DEMEROL ) injection 6.25-12.5 mg 11/21 - 11/21 0 of 7.5-15 --    fentaNYL  (SUBLIMAZE ) injection 11/21 - 11/21 *15 of 15 --    Daily Totals  * 15 of Unknown (at least 62.5-110) --  *One-Step medication  Calculation Errors     Order Type Date Details   oxyCODONE  (Oxy IR/ROXICODONE ) immediate release tablet 5 mg Ordered Dose -- Insufficient frequency information   oxyCODONE  (ROXICODONE ) 5 MG/5ML solution 5 mg Ordered Dose -- Insufficient frequency information            Patient benefited maximally from hospital stay and there were no complications.    Recent vital signs: Patient Vitals for the past 24 hrs:  BP Temp Temp src Pulse Resp SpO2 Height Weight  10/13/24 0952 125/76 -- -- 69 -- -- -- --  10/13/24 0909 124/81 98.2 F (36.8 C) -- 68 17 96 % -- --  10/13/24 0628 114/65 98.4 F (36.9 C) Oral 69 16 94 % -- --  10/13/24 0130 109/60 98.3 F (36.8 C) Oral 71 16 94 % -- --  10/12/24 2000 (!) 124/57 98.2 F (36.8 C) Oral (!) 52 17 99 % -- --  10/12/24 1807 106/64 97.6 F (36.4 C) Oral (!) 42 16 -- 6' 2 (1.88 m) 99.8 kg  10/12/24 1741 106/64 97.6 F (36.4 C) Oral (!) 42 16 98 % -- --  10/12/24 1715 (!) 108/55 -- -- 63 13 100 % -- --  10/12/24 1702 (!) 101/58 -- -- 72 16 98 % -- --  10/12/24 1645 (!) 103/55 -- -- 75 (!) 21 99 % -- --  10/12/24 1633 (!) 107/48 -- -- 74 16 96 % -- --  10/12/24 1630 (!) 107/48 -- -- 76 (!) 23 99 % -- --  10/12/24 1615 93/74 -- -- 82 16 98 % -- --  10/12/24 1605 105/70 -- -- 75 17 100 % -- --   10/12/24 1600 (!) 87/53 -- -- 83 18 96 % -- --  10/12/24 1555 (!) 102/49 -- -- 78 13 98 % -- --  10/12/24 1553 (!) 80/43 97.8 F (36.6 C) -- 78 14 97 % -- --     Recent laboratory studies:  Recent Labs    10/13/24 0301  WBC 11.2*  HGB 12.9*  HCT 38.0*  PLT 163  NA 138  K 4.1  CL 103  CO2 26  BUN 15  CREATININE 0.83  GLUCOSE 175*  CALCIUM  8.6*     Discharge Medications:   Allergies as of 10/13/2024   No Known Allergies      Medication List     TAKE these medications    acetaminophen  500 MG tablet Commonly known as: TYLENOL  Take 1,000 mg by mouth every 6 (six) hours as needed for moderate pain (pain score 4-6).   Aspirin  Low Dose 81 MG chewable tablet Generic drug: aspirin  Chew 1 tablet (81 mg total) by mouth 2 (two) times daily.   ibuprofen  800 MG tablet Commonly known as: ADVIL  Take 1 tablet (800 mg total) by mouth every 8 (eight) hours as needed. What changed:  medication strength how much to take when to take this reasons to take this   levothyroxine  88 MCG tablet Commonly known as: SYNTHROID  Take 1 tablet (88 mcg total) by mouth every morning on an empty stomach   methocarbamol  500 MG tablet Commonly known as: ROBAXIN  Take 1 tablet (500 mg total) by mouth every 6 (six) hours as needed for muscle spasms.   metoprolol  succinate 25 MG 24 hr tablet Commonly known as: Toprol  XL Take 1 tablet (25 mg total) by mouth daily. Need to schedule an appointment. What changed: additional instructions   oxyCODONE  5 MG immediate release tablet Commonly known as: Oxy IR/ROXICODONE  Take 1-2 tablets (5-10 mg total) by mouth every 6 (six) hours as needed for moderate pain (pain score 4-6) (pain score 4-6).   rosuvastatin  10 MG tablet Commonly known as: CRESTOR  Take 1 tablet (10 mg total) by mouth daily.   sertraline  100 MG tablet Commonly known as: ZOLOFT  Take 1 tablet (100 mg total) by mouth at bedtime.               Durable Medical Equipment   (From admission, onward)           Start     Ordered   10/12/24 1710  DME 3 n 1  Once  10/12/24 1709   10/12/24 1710  DME Walker rolling  Once       Question Answer Comment  Walker: With 5 Inch Wheels   Patient needs a walker to treat with the following condition Status post total replacement of left hip      10/12/24 1709            Diagnostic Studies: DG Pelvis Portable Result Date: 10/12/2024 EXAM: 1 or 2 VIEW(S) XRAY OF THE PELVIS 10/12/2024 04:09:00 PM COMPARISON: 04/13/2019 CLINICAL HISTORY: [id number] Status post total replacement of left hip [id number] Status post total replacement of left hip FINDINGS: BONES AND JOINTS: Left acetabular and femoral components are well situated. Expected postoperative changes are noted. No acute fracture. No focal osseous lesion. No joint dislocation. SOFT TISSUES: Expected postoperative changes are seen in the surrounding soft tissues. IMPRESSION: 1. Status post total left hip arthroplasty with well-positioned acetabular and femoral components and expected postoperative changes in the surrounding soft tissues. Electronically signed by: Lynwood Seip MD 10/12/2024 04:16 PM EST RP Workstation: [id number]   DG HIP UNILAT WITH PELVIS 1V LEFT Result Date: 10/12/2024 EXAM: 3 INTRAOPERATIVE FLUOROSCOPIC IMAGES XRAY OF THE LEFT HIP 10/12/2024 03:26:00 PM Radiation exposure index 3.4545 mGy. COMPARISON: None available. CLINICAL HISTORY: [id number] Surgery, elective [id number] Surgery, elective [phone] FINDINGS: BONES AND JOINTS: The femoral and acetabular components are well situated. IMPRESSION: 1. Well-situated femoral and acetabular components. Electronically signed by: Lynwood Seip MD 10/12/2024 04:15 PM EST RP Workstation: [id number]   DG C-Arm 1-60 Min-No Report Result Date: 10/12/2024 Fluoroscopy was utilized by the requesting physician.  No radiographic interpretation.   DG C-Arm 1-60 Min-No Report Result Date: 10/12/2024 Fluoroscopy was utilized by  the requesting physician.  No radiographic interpretation.    Disposition: Discharge disposition: 01-Home or Self Care          Follow-up Information     Vernetta Lonni GRADE, MD Follow up in 2 week(s).   Specialty: Orthopedic Surgery Contact information: [address] [place] [address] [phone]                  Signed: Lonni GRADE Vernetta 10/13/2024, 1:21 PM

## 2024-10-13 NOTE — Progress Notes (Signed)
 Subjective: 1 Day Post-Op Procedure(s) (LRB): ARTHROPLASTY, HIP, TOTAL, ANTERIOR APPROACH (Left) Patient reports pain as moderate.  He did have a hot flash this morning when he was up with therapy but his vital signs remained stable.  He looks good right now.  His goal is to hopefully be discharged home this afternoon.  His wife is with him at the bedside.  Objective: Vital signs in last 24 hours: Temp:  [97.6 F (36.4 C)-99 F (37.2 C)] 98.2 F (36.8 C) (11/22 0909) Pulse Rate:  [42-83] 68 (11/22 0909) Resp:  [13-23] 17 (11/22 0909) BP: (80-132)/(43-81) 124/81 (11/22 0909) SpO2:  [94 %-100 %] 96 % (11/22 0909) Weight:  [99.8 kg] 99.8 kg (11/21 1807)  Intake/Output from previous day: 11/21 0701 - 11/22 0700 In: 1300.2 [I.V.:1300.2] Out: 2100 [Urine:1600; Blood:500] Intake/Output this shift: No intake/output data recorded.  Recent Labs    10/13/24 0301  HGB 12.9*   Recent Labs    10/13/24 0301  WBC 11.2*  RBC 4.00*  HCT 38.0*  PLT 163   Recent Labs    10/13/24 0301  NA 138  K 4.1  CL 103  CO2 26  BUN 15  CREATININE 0.83  GLUCOSE 175*  CALCIUM  8.6*   No results for input(s): LABPT, INR in the last 72 hours.  Sensation intact distally Intact pulses distally Dorsiflexion/Plantar flexion intact Incision: dressing C/D/I   Assessment/Plan: 1 Day Post-Op Procedure(s) (LRB): ARTHROPLASTY, HIP, TOTAL, ANTERIOR APPROACH (Left) Up with therapy Discharge home with home health this afternoon.      [name] 10/13/2024, 9:34 AM

## 2024-10-13 NOTE — TOC Transition Note (Signed)
 Transition of Care [facility]) - Discharge Note   Patient Details  Name: Rickey Cochran MRN: [id number] Date of Birth: 07-Jun-1961  [facility] CM/SW Contact:  NORMAN ASPEN, LCSW Phone Number: 10/13/2024, 8:18 AM   Clinical Narrative:     Met with pt who confirms he has needed DME in the home.  Pt aware HHPT prearranged with [facility] via ortho MD office.  No further IP CM needs.  Final next level of care: Home w Home Health Services Barriers to Discharge: No Barriers Identified   Patient Goals and CMS Choice Patient states their goals for this hospitalization and ongoing recovery are:: return home          Discharge Placement                       Discharge Plan and Services Additional resources added to the After Visit Summary for                  DME Arranged: N/A DME Agency: NA       HH Arranged: PT HH Agency: [facility]        Social Drivers of Health (SDOH) Interventions SDOH Screenings   Food Insecurity: No Food Insecurity (10/12/2024)  Housing: Low Risk  (10/12/2024)  Transportation Needs: No Transportation Needs (10/12/2024)  Utilities: Not At Risk (10/12/2024)  Depression (PHQ2-9): Low Risk  (02/15/2024)  Financial Resource Strain: Low Risk  (02/12/2024)  Physical Activity: Sufficiently Active (02/12/2024)  Social Connections: Moderately Integrated (02/12/2024)  Stress: Stress Concern Present (02/12/2024)  Tobacco Use: Medium Risk (10/12/2024)     Readmission Risk Interventions     No data to display

## 2024-10-13 NOTE — Progress Notes (Signed)
 Physical Therapy Treatment Patient Details Name: Rickey Cochran MRN: [id number] DOB: 1960-12-16 Today's Date: 10/13/2024   History of Present Illness Pt s/p L THR and with hx of R THR, L TKR, back surgery and thyroid  CA.    PT Comments  Pt feeling much better and progressing well with mobility.  Pt up to ambulate in hall, negotiated stairs, and reviewed car transfers with spouse present.  Pt eager for dc home this date.    If plan is discharge home, recommend the following:     Can travel by private vehicle        Equipment Recommendations  None recommended by PT    Recommendations for Other Services       Precautions / Restrictions Precautions Precautions: Fall Restrictions Weight Bearing Restrictions Per Provider Order: No Other Position/Activity Restrictions: WBAT     Mobility  Bed Mobility Overal bed mobility: Needs Assistance Bed Mobility: Rolling, Supine to Sit     Supine to sit: Contact guard     General bed mobility comments: Up in chair and returns to same    Transfers Overall transfer level: Needs assistance Equipment used: Rolling walker (2 wheels) Transfers: Sit to/from Stand Sit to Stand: Contact guard assist, Supervision           General transfer comment: cues for LE management and use of UEs to self assist    Ambulation/Gait Ambulation/Gait assistance: Contact guard assist, Supervision Gait Distance (Feet): 120 Feet Assistive device: Rolling walker (2 wheels) Gait Pattern/deviations: Decreased step length - right, Decreased step length - left, Shuffle, Trunk flexed, Step-to pattern, Step-through pattern Gait velocity: decr     General Gait Details: Min cues for sequence, posture and position from [employer] Stairs: Yes Stairs assistance: Min assist Stair Management: No rails, Forwards, Step to pattern, With walker Number of Stairs: 4 General stair comments: 2 step twice with RW set to top of stairs; cues for  sequence   Wheelchair Mobility     Tilt Bed    Modified Rankin (Stroke Patients Only)       Balance Overall balance assessment: Needs assistance Sitting-balance support: No upper extremity supported, Feet supported Sitting balance-Leahy Scale: Good     Standing balance support: Bilateral upper extremity supported Standing balance-Leahy Scale: Poor                              Communication Communication Communication: No apparent difficulties  Cognition Arousal: Alert Behavior During Therapy: WFL for tasks assessed/performed   PT - Cognitive impairments: No apparent impairments                         Following commands: Intact      Cueing Cueing Techniques: Verbal cues  Exercises Total Joint Exercises Ankle Circles/Pumps: AROM, Both, 15 reps, Supine Quad Sets: AROM, Both, 10 reps, Supine Heel Slides: AAROM, 20 reps, Supine, Left Hip ABduction/ADduction: AAROM, Left, 15 reps, Supine    General Comments        Pertinent Vitals/Pain Pain Assessment Pain Assessment: 0-10 Pain Score: 5  Pain Location: L hip Pain Descriptors / Indicators: Aching, Burning, Sore Pain Intervention(s): Limited activity within patient's tolerance, Monitored during session, Premedicated before session, Ice applied    Home Living Family/patient expects to be discharged to:: Private residence Living Arrangements: Spouse/significant other Available Help at Discharge: Family Type of Home: House Home Access: Stairs to enter Entrance Stairs-Rails: None  Entrance Stairs-Number of Steps: 2 Alternate Level Stairs-Number of Steps: 1 flight Home Layout: Two level;Able to live on main level with bedroom/bathroom Home Equipment: None;Rolling Walker (2 wheels);BSC/3in1      Prior Function            PT Goals (current goals can now be found in the care plan section) Acute Rehab PT Goals Patient Stated Goal: Regain IND PT Goal Formulation: With patient Time For  Goal Achievement: 10/20/24 Potential to Achieve Goals: Good Progress towards PT goals: Progressing toward goals    Frequency    7X/week      PT Plan      Co-evaluation              AM-PAC PT 6 Clicks Mobility   Outcome Measure  Help needed turning from your back to your side while in a flat bed without using bedrails?: A Little Help needed moving from lying on your back to sitting on the side of a flat bed without using bedrails?: A Little Help needed moving to and from a bed to a chair (including a wheelchair)?: A Little Help needed standing up from a chair using your arms (e.g., wheelchair or bedside chair)?: A Little Help needed to walk in hospital room?: A Little Help needed climbing 3-5 steps with a railing? : A Little 6 Click Score: 18    End of Session Equipment Utilized During Treatment: Gait belt Activity Tolerance: Patient tolerated treatment well Patient left: in chair;with call bell/phone within reach;with chair alarm set;with family/visitor present Nurse Communication: Mobility status PT Visit Diagnosis: Difficulty in walking, not elsewhere classified (R26.2)     Time: 8849-8784 PT Time Calculation (min) (ACUTE ONLY): 25 min  Charges:    $Gait Training: 8-22 mins $Therapeutic Exercise: 8-22 mins $Therapeutic Activity: 8-22 mins PT General Charges $$ ACUTE PT VISIT: 1 Visit                     [facility] PT Acute Rehabilitation Services Office [phone]    Charmayne Odell 10/13/2024, 1:45 PM

## 2024-10-14 DIAGNOSIS — I10 Essential (primary) hypertension: Secondary | ICD-10-CM | POA: Diagnosis not present

## 2024-10-14 DIAGNOSIS — I472 Ventricular tachycardia, unspecified: Secondary | ICD-10-CM | POA: Diagnosis not present

## 2024-10-14 DIAGNOSIS — D539 Nutritional anemia, unspecified: Secondary | ICD-10-CM | POA: Diagnosis not present

## 2024-10-14 DIAGNOSIS — M48062 Spinal stenosis, lumbar region with neurogenic claudication: Secondary | ICD-10-CM | POA: Diagnosis not present

## 2024-10-14 DIAGNOSIS — F419 Anxiety disorder, unspecified: Secondary | ICD-10-CM | POA: Diagnosis not present

## 2024-10-14 DIAGNOSIS — F32A Depression, unspecified: Secondary | ICD-10-CM | POA: Diagnosis not present

## 2024-10-14 DIAGNOSIS — E785 Hyperlipidemia, unspecified: Secondary | ICD-10-CM | POA: Diagnosis not present

## 2024-10-14 DIAGNOSIS — Z471 Aftercare following joint replacement surgery: Secondary | ICD-10-CM | POA: Diagnosis not present

## 2024-10-14 DIAGNOSIS — E890 Postprocedural hypothyroidism: Secondary | ICD-10-CM | POA: Diagnosis not present

## 2024-10-14 DIAGNOSIS — I480 Paroxysmal atrial fibrillation: Secondary | ICD-10-CM | POA: Diagnosis not present

## 2024-10-15 ENCOUNTER — Encounter (HOSPITAL_COMMUNITY): Payer: Self-pay | Admitting: Orthopaedic Surgery

## 2024-10-15 NOTE — Anesthesia Postprocedure Evaluation (Signed)
 Anesthesia Post Note  Patient: Rickey Cochran  Procedure(s) Performed: ARTHROPLASTY, HIP, TOTAL, ANTERIOR APPROACH (Left: Hip)     Patient location during evaluation: PACU Anesthesia Type: Spinal Level of consciousness: oriented and awake and alert Pain management: pain level controlled Vital Signs Assessment: post-procedure vital signs reviewed and stable Respiratory status: spontaneous breathing, respiratory function stable and patient connected to nasal cannula oxygen Cardiovascular status: blood pressure returned to baseline and stable Postop Assessment: no headache, no backache and no apparent nausea or vomiting Anesthetic complications: no   No notable events documented.          [name] [name]

## 2024-10-16 DIAGNOSIS — F419 Anxiety disorder, unspecified: Secondary | ICD-10-CM | POA: Diagnosis not present

## 2024-10-16 DIAGNOSIS — I10 Essential (primary) hypertension: Secondary | ICD-10-CM | POA: Diagnosis not present

## 2024-10-16 DIAGNOSIS — F32A Depression, unspecified: Secondary | ICD-10-CM | POA: Diagnosis not present

## 2024-10-16 DIAGNOSIS — I480 Paroxysmal atrial fibrillation: Secondary | ICD-10-CM | POA: Diagnosis not present

## 2024-10-16 DIAGNOSIS — E785 Hyperlipidemia, unspecified: Secondary | ICD-10-CM | POA: Diagnosis not present

## 2024-10-16 DIAGNOSIS — Z471 Aftercare following joint replacement surgery: Secondary | ICD-10-CM | POA: Diagnosis not present

## 2024-10-16 DIAGNOSIS — D539 Nutritional anemia, unspecified: Secondary | ICD-10-CM | POA: Diagnosis not present

## 2024-10-16 DIAGNOSIS — M48062 Spinal stenosis, lumbar region with neurogenic claudication: Secondary | ICD-10-CM | POA: Diagnosis not present

## 2024-10-16 DIAGNOSIS — I472 Ventricular tachycardia, unspecified: Secondary | ICD-10-CM | POA: Diagnosis not present

## 2024-10-16 DIAGNOSIS — E890 Postprocedural hypothyroidism: Secondary | ICD-10-CM | POA: Diagnosis not present

## 2024-10-19 DIAGNOSIS — F419 Anxiety disorder, unspecified: Secondary | ICD-10-CM | POA: Diagnosis not present

## 2024-10-19 DIAGNOSIS — M48062 Spinal stenosis, lumbar region with neurogenic claudication: Secondary | ICD-10-CM | POA: Diagnosis not present

## 2024-10-19 DIAGNOSIS — E785 Hyperlipidemia, unspecified: Secondary | ICD-10-CM | POA: Diagnosis not present

## 2024-10-19 DIAGNOSIS — Z471 Aftercare following joint replacement surgery: Secondary | ICD-10-CM | POA: Diagnosis not present

## 2024-10-19 DIAGNOSIS — I10 Essential (primary) hypertension: Secondary | ICD-10-CM | POA: Diagnosis not present

## 2024-10-19 DIAGNOSIS — D539 Nutritional anemia, unspecified: Secondary | ICD-10-CM | POA: Diagnosis not present

## 2024-10-19 DIAGNOSIS — I480 Paroxysmal atrial fibrillation: Secondary | ICD-10-CM | POA: Diagnosis not present

## 2024-10-19 DIAGNOSIS — F32A Depression, unspecified: Secondary | ICD-10-CM | POA: Diagnosis not present

## 2024-10-19 DIAGNOSIS — I472 Ventricular tachycardia, unspecified: Secondary | ICD-10-CM | POA: Diagnosis not present

## 2024-10-19 DIAGNOSIS — E890 Postprocedural hypothyroidism: Secondary | ICD-10-CM | POA: Diagnosis not present

## 2024-10-21 DIAGNOSIS — I472 Ventricular tachycardia, unspecified: Secondary | ICD-10-CM | POA: Diagnosis not present

## 2024-10-21 DIAGNOSIS — M48062 Spinal stenosis, lumbar region with neurogenic claudication: Secondary | ICD-10-CM | POA: Diagnosis not present

## 2024-10-21 DIAGNOSIS — E785 Hyperlipidemia, unspecified: Secondary | ICD-10-CM | POA: Diagnosis not present

## 2024-10-21 DIAGNOSIS — I480 Paroxysmal atrial fibrillation: Secondary | ICD-10-CM | POA: Diagnosis not present

## 2024-10-21 DIAGNOSIS — D539 Nutritional anemia, unspecified: Secondary | ICD-10-CM | POA: Diagnosis not present

## 2024-10-21 DIAGNOSIS — Z471 Aftercare following joint replacement surgery: Secondary | ICD-10-CM | POA: Diagnosis not present

## 2024-10-21 DIAGNOSIS — I10 Essential (primary) hypertension: Secondary | ICD-10-CM | POA: Diagnosis not present

## 2024-10-21 DIAGNOSIS — E890 Postprocedural hypothyroidism: Secondary | ICD-10-CM | POA: Diagnosis not present

## 2024-10-21 DIAGNOSIS — F32A Depression, unspecified: Secondary | ICD-10-CM | POA: Diagnosis not present

## 2024-10-21 DIAGNOSIS — F419 Anxiety disorder, unspecified: Secondary | ICD-10-CM | POA: Diagnosis not present

## 2024-10-25 ENCOUNTER — Other Ambulatory Visit (HOSPITAL_COMMUNITY): Payer: Self-pay

## 2024-10-25 ENCOUNTER — Ambulatory Visit: Admitting: Orthopaedic Surgery

## 2024-10-25 ENCOUNTER — Encounter: Payer: Self-pay | Admitting: Orthopaedic Surgery

## 2024-10-25 DIAGNOSIS — Z96642 Presence of left artificial hip joint: Secondary | ICD-10-CM

## 2024-10-25 MED ORDER — IBUPROFEN 800 MG PO TABS
800.0000 mg | ORAL_TABLET | Freq: Three times a day (TID) | ORAL | 0 refills | Status: AC | PRN
  Filled 2024-10-25: qty 30, 10d supply, fill #0
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
## 2024-10-25 NOTE — Progress Notes (Signed)
 HPI: [name] returns today status post left total hip arthroplasty 10/12/2024.  He is no longer taking any narcotics for his pain.  Only taken Tylenol  and ibuprofen .  He states that his hip has been a bit harder for him to get over the last but he was able to walk 2 miles yesterday.  He is on aspirin  twice daily for DVT prophylaxis he was on no aspirin  prior to surgery.  Otherwise he is doing well.  Review of systems: See HPI  Physical exam: General well-developed well-nourished male no acute distress ambulates with slight antalgic gait but no assistive device. Left hip: Surgical incisions healing well no signs of infection no wound dehiscence.  Good fluid range of motion of the hip with internal/external rotation.  Left calf supple nontender dorsiflexion plantarflexion left ankle intact.  Impression: Status post left total hip arthroplasty 10/12/2024  Plan: He will continue to work on range of motion and strengthening left hip.  Will call in some ibuprofen  800 mg for an disease found this is beneficial.  He will stay on an 81 mg aspirin  for another week and then discontinue as he was on no aspirin  prior to surgery.  Will see him in 6 weeks sooner if there is any questions concerns.  Staples were harvested Steri-Strips applied.

## 2024-10-26 ENCOUNTER — Other Ambulatory Visit (HOSPITAL_COMMUNITY): Payer: Self-pay

## 2024-10-26 ENCOUNTER — Encounter: Payer: Self-pay | Admitting: Pulmonary Disease

## 2024-10-26 ENCOUNTER — Ambulatory Visit: Attending: Pulmonary Disease | Admitting: Pulmonary Disease

## 2024-10-26 ENCOUNTER — Ambulatory Visit: Payer: Self-pay

## 2024-10-26 ENCOUNTER — Ambulatory Visit: Admitting: Internal Medicine

## 2024-10-26 VITALS — BP 120/74 | HR 56 | Ht 74.0 in | Wt 221.8 lb

## 2024-10-26 VITALS — BP 120/80 | HR 55 | Temp 98.6°F | Ht 74.0 in | Wt 220.0 lb

## 2024-10-26 DIAGNOSIS — R002 Palpitations: Secondary | ICD-10-CM | POA: Diagnosis not present

## 2024-10-26 DIAGNOSIS — T7840XA Allergy, unspecified, initial encounter: Secondary | ICD-10-CM | POA: Diagnosis not present

## 2024-10-26 MED ORDER — METOPROLOL SUCCINATE ER 25 MG PO TB24
25.0000 mg | ORAL_TABLET | Freq: Every day | ORAL | 3 refills | Status: AC
  Filled 2024-10-26: qty 90, 90d supply, fill #0

## 2024-10-26 MED ORDER — PREDNISONE 10 MG PO TABS
ORAL_TABLET | ORAL | 0 refills | Status: AC
  Filled 2024-10-26: qty 30, 12d supply, fill #0

## 2024-10-26 NOTE — Patient Instructions (Addendum)
      Steroid injection today    Medications changes include :   prednisone  taper     Return if symptoms worsen or fail to improve.

## 2024-10-26 NOTE — Patient Instructions (Signed)
 Medication Instructions:  Your physician recommends that you continue on your current medications as directed. Please refer to the Current Medication list given to you today.  *If you need a refill on your cardiac medications before your next appointment, please call your pharmacy*  Lab Work: None ordered If you have labs (blood work) drawn today and your tests are completely normal, you will receive your results only by: MyChart Message (if you have MyChart) OR A paper copy in the mail If you have any lab test that is abnormal or we need to change your treatment, we will call you to review the results.  Follow-Up: At [facility], you and your health needs are our priority.  As part of our continuing mission to provide you with exceptional heart care, our providers are all part of one team.  This team includes your primary Cardiologist (physician) and Advanced Practice Providers or APPs (Physician Assistants and Nurse Practitioners) who all work together to provide you with the care you need, when you need it.  Your next appointment:   1 year(s)  Provider:   Daphne Barrack, NP

## 2024-10-26 NOTE — Progress Notes (Signed)
 Subjective:    Patient ID: Rickey Cochran, male    DOB: 06-Apr-1961, 63 y.o.   MRN: [id number]      HPI Rickey Cochran is here for  Chief Complaint  Patient presents with   Acute Visit    widespread rash, welts, itching in the backof the beah and in the groin area just started yesterday seems to be popping up in other places around the body     Discussed the use of AI scribe software for clinical note transcription with the patient, who gave verbal consent to proceed.  History of Present Illness Rickey Cochran is a 63 year old male who presents with a widespread itchy rash following recent hip replacement surgery.  He recently underwent his second hip replacement surgery and had a postoperative appointment yesterday where his staples were removed. Following the removal of the bandage and application of a topical solution, he developed a rash with welts and itching. The rash began around the surgical site and has spread to his waistline, back of the head, legs, arms, and around his eye.  He describes the rash as 'itching like crazy' and notes that it is spreading. He has a history of sensitivity to adhesives, which he experienced previously with a heart monitor. He recalls having night sweats post-surgery but no fever, throat swelling, tongue swelling, shortness of breath, or wheezing.  His current medications include levothyroxine , rosuvastatin , metoprolol , and sertraline . He has been taking ibuprofen  and acetaminophen  for pain management post-surgery. He took diphenhydramine  last night and this morning, which helped him sleep but did not significantly alleviate the pruritus. He also applied hydrocortisone cream to the rash areas.  He has a history of major back surgery, left knee replacement, right hip replacement, and multi-disc fusion without prior issues with sutures or anesthesia. He has not had significant allergic reactions in the past, except for some sensitivity to  bandages and adhesives.         Medications and allergies reviewed with patient and updated if appropriate.  Current Outpatient Medications on File Prior to Visit  Medication Sig Dispense Refill   metoprolol  succinate (TOPROL  XL) 25 MG 24 hr tablet Take 1 tablet (25 mg total) by mouth daily. Need to schedule an appointment. 15 tablet 0   acetaminophen  (TYLENOL ) 500 MG tablet Take 1,000 mg by mouth every 6 (six) hours as needed for moderate pain (pain score 4-6).     aspirin  81 MG chewable tablet Chew 1 tablet (81 mg total) by mouth 2 (two) times daily. 30 tablet 0   ibuprofen  (ADVIL ) 800 MG tablet Take 1 tablet (800 mg total) by mouth every 8 (eight) hours as needed. 30 tablet 0   levothyroxine  (SYNTHROID ) 88 MCG tablet Take 1 tablet (88 mcg total) by mouth every morning on an empty stomach 90 tablet 1   methocarbamol  (ROBAXIN ) 500 MG tablet Take 1 tablet (500 mg total) by mouth every 6 (six) hours as needed for muscle spasms. 30 tablet 0   rosuvastatin  (CRESTOR ) 10 MG tablet Take 1 tablet (10 mg total) by mouth daily. 90 tablet 0   sertraline  (ZOLOFT ) 100 MG tablet Take 1 tablet (100 mg total) by mouth at bedtime. 90 tablet 1   No current facility-administered medications on file prior to visit.    Review of Systems     Objective:   Vitals:   10/26/24 1136  BP: 120/80  Pulse: (!) 55  Temp: 98.6 F (37 C)  SpO2: 99%  BP Readings from Last 3 Encounters:  10/26/24 120/80  10/13/24 125/76  10/04/24 137/86   Wt Readings from Last 3 Encounters:  10/26/24 220 lb (99.8 kg)  10/12/24 220 lb (99.8 kg)  10/04/24 220 lb (99.8 kg)   Body mass index is 28.25 kg/m.    Physical Exam Constitutional:      General: He is not in acute distress.    Appearance: Normal appearance. He is not ill-appearing.  HENT:     Head: Normocephalic and atraumatic.  Pulmonary:     Effort: Pulmonary effort is normal.  Musculoskeletal:     Right lower leg: No edema.     Left lower leg: No  edema.  Skin:    General: Skin is warm and dry.     Comments: Hive like rash near incision on left hip and extending across lower abdomen, left lower anterior arm, around right eye.  Several - possibly 20 gumball sized bumps on posterior head  Neurological:     Mental Status: He is alert.            Assessment & Plan:    See Problem List for Assessment and Plan of chronic medical problems.   Assessment and Plan Assessment & Plan Allergic reaction with widespread pruritic rash and welts Acute allergic reaction likely due to surgical materials or topical solutions. No respiratory distress or systemic symptoms. Significant discomfort and itching. - Administered steroid injection - Depo-medrol  80 mg IM x 1. - Prescribed oral prednisone  taper starting tomorrow morning with food. - Advised use of antihistamines such as Benadryl  at night and Claritin or Zyrtec during the day as needed. - Instructed to monitor for side effects of steroids and report any major side effects. - Advised to avoid ibuprofen  due to similarity with steroid treatment.

## 2024-10-26 NOTE — Telephone Encounter (Signed)
 FYI Only or Action Required?: FYI only for provider: appointment scheduled on today.  Patient was last seen in primary care on 08/27/2024 by Joshua Debby CROME, MD.  Called Nurse Triage reporting Rash.  Symptoms began yesterday.  Interventions attempted: OTC medications: Benadryl .  Symptoms are: unchanged.  Triage Disposition: See Physician Within 24 Hours  Patient/caregiver understands and will follow disposition?: Yes   Copied from CRM #[id number]. Topic: Clinical - Red Word Triage >> Oct 26, 2024  8:07 AM Treva T wrote: [facility] that prompted transfer to Nurse Triage: Pt thinks he is having an allergic reaction. Reports had hip replacement a couple weeks ago. Staples were removed by surgeon on yesterday, and steri strips and tape were applied to area around where staples were removed.   States around the site he has increased itching, large whelps around the site, and on scalp, also around waist and mid-section.   Pt noticed sudden onset of symptoms late last night, took benadryl  so he could sleep, but not helping.  Pt requesting medical advice, or to be seen by provider or evaluation. Reason for Disposition  [1] Looks infected (e.g., spreading redness, pus) AND [2] no fever  Answer Assessment - Initial Assessment Questions Yesterday post op, removed staples, steri strips applied. Few hours later he developed welts and itching at site, this spread throughout the evening to his arm and multiple spots on his scalps, mostly back of head.  Used Benadryl  last night which helped him sleep but rash persisting. No new medications, foods, or products that he is aware of. Requesting evaluation in clinic today. Scheduled.     1. APPEARANCE of RASH: What does the rash look like? (e.g., blisters, dry flaky skin, red spots, redness, sores)     Welts 1 2. LOCATION: Where is the rash located?      Along waist line, one on arm, and scalp 3. NUMBER: How many spots are there?      multiple 4.  SIZE: How big are the spots? (e.g., inches, cm; or compare to size of pinhead, tip of pen, eraser, pea)      multiple 5. ONSET: When did the rash start?      yesterday 6. ITCHING: Does the rash itch? If Yes, ask: How bad is the itch?  (Scale 0-10; or none, mild, moderate, severe)     yes 7. PAIN: Does the rash hurt? If Yes, ask: How bad is the pain?  (Scale 0-10; or none, mild, moderate, severe)     denies 8. OTHER SYMPTOMS: Do you have any other symptoms? (e.g., fever)     Denies  9. PREGNANCY: Is there any chance you are pregnant? When was your last menstrual period?  Protocols used: Rash or Redness - Localized-A-AH

## 2024-11-02 ENCOUNTER — Other Ambulatory Visit (HOSPITAL_COMMUNITY): Payer: Self-pay

## 2024-11-02 ENCOUNTER — Other Ambulatory Visit: Payer: Self-pay

## 2024-12-05 ENCOUNTER — Encounter: Payer: Self-pay | Admitting: Orthopaedic Surgery

## 2024-12-05 ENCOUNTER — Ambulatory Visit: Admitting: Orthopaedic Surgery

## 2024-12-05 DIAGNOSIS — Z96642 Presence of left artificial hip joint: Secondary | ICD-10-CM

## 2024-12-05 NOTE — Progress Notes (Signed)
 Rickey Cochran comes in today about 6 weeks status post a left total hip replacement.  We have replaced his right hip in the past.  He is an avid hiker and backpacking.  He plans to hike the [place] up in [place]  around June.  He says he does need to see someone at some point for orthotics close you just do not feel great on him and he needs good orthotics.  I have recommended the [facility].  He says the incision looks good with his hip.  Overall he is doing well.  He is walking without assistive device and no significant limp.  Both his hips move smoothly and fluidly.   At this point he has no restrictions with his activities.  Will see him back in about 4 months before he goes on his trip and will have a standing AP pelvis at that visit.

## 2024-12-19 ENCOUNTER — Ambulatory Visit

## 2024-12-19 ENCOUNTER — Ambulatory Visit (INDEPENDENT_AMBULATORY_CARE_PROVIDER_SITE_OTHER): Admitting: Podiatry

## 2024-12-19 VITALS — Ht 74.0 in | Wt 221.0 lb

## 2024-12-19 DIAGNOSIS — M7741 Metatarsalgia, right foot: Secondary | ICD-10-CM

## 2024-12-19 DIAGNOSIS — M7742 Metatarsalgia, left foot: Secondary | ICD-10-CM

## 2024-12-19 DIAGNOSIS — M21621 Bunionette of right foot: Secondary | ICD-10-CM | POA: Diagnosis not present

## 2024-12-19 DIAGNOSIS — M21622 Bunionette of left foot: Secondary | ICD-10-CM

## 2024-12-19 NOTE — Progress Notes (Signed)
 "  Chief Complaint  Patient presents with   Foot Orthotics    RM 6  New Patient-Patient would like to discuss getting orthotics with provider/Need to evaluate feet    HPI: 64 y.o. malepresenting as a reestablish new patient for evaluation of pain and tenderness associated to the right forefoot.  This has been ongoing for several years.  He has tried different shoe gear modifications and OTC prefabricated insoles.  He is very active and enjoys hiking and outdoors.  He would like to discuss possible option of orthotics to see if this can help alleviate some of his pressure and pain to the forefoot  Past Medical History:  Diagnosis Date   Arthritis    BPH (benign prostatic hyperplasia)    Cancer (HCC)    s/p partial thyroidectomy   Depression    Dysrhythmia    Hypertension    several years ago was on lisiopril but lost weight and no longer needs   Hypothyroidism    mgd on levothyroxine     Left-sided tinnitus    Premature atrial contraction    was seen on EKG at his PCP approx 8 years ago, denies any symptoms, reports he was a runner before onset of hip pain in feb 2020   Sleep apnea    not using CPAP    Past Surgical History:  Procedure Laterality Date   BACK SURGERY     lumbar fusion   COLONOSCOPY     DRUG INDUCED ENDOSCOPY N/A 10/27/2022   Procedure: DRUG INDUCED SLEEP ENDOSCOPY;  Surgeon: Mable Lenis, MD;  Location: [facility];  Service: ENT;  Laterality: N/A;   IMPLANTATION OF HYPOGLOSSAL NERVE STIMULATOR Right 03/16/2023   Procedure: IMPLANTATION OF HYPOGLOSSAL NERVE STIMULATOR;  Surgeon: Carlie Clark, MD;  Location:  SURGERY CENTER;  Service: ENT;  Laterality: Right;   IR THORACENTESIS RIGHT ASP PLEURAL SPACE W/IMG GUIDE  03/20/2021   JOINT REPLACEMENT Right 04/13/2019   right total hip arthroplasty   THYROID  LOBECTOMY Right 05/09/2018   THYROID  LOBECTOMY Right 05/09/2018   Procedure: RIGHT THYROID  LOBECTOMY;  Surgeon: Rubin Calamity, MD;   Location: [facility];  Service: General;  Laterality: Right;   TOTAL HIP ARTHROPLASTY Right 04/13/2019   Procedure: RIGHT TOTAL HIP ARTHROPLASTY ANTERIOR APPROACH;  Surgeon: Vernetta Lonni GRADE, MD;  Location: [facility];  Service: Orthopedics;  Laterality: Right;   TOTAL HIP ARTHROPLASTY Left 10/12/2024   Procedure: ARTHROPLASTY, HIP, TOTAL, ANTERIOR APPROACH;  Surgeon: Vernetta Lonni GRADE, MD;  Location: [facility];  Service: Orthopedics;  Laterality: Left;   TOTAL KNEE ARTHROPLASTY Left 11/12/2021   Procedure: LEFT TOTAL KNEE ARTHROPLASTY;  Surgeon: Addie Cordella Hamilton, MD;  Location[facility];  Service: Orthopedics;  Laterality: Left;    Allergies[1]   Physical Exam: General: The patient is alert and oriented x3 in no acute distress.  Dermatology: Skin is warm, dry and supple bilateral lower extremities.   Vascular: Palpable pedal pulses bilaterally. Capillary refill within normal limits.  No appreciable edema.  No erythema.  Neurological: Grossly intact via light touch  Musculoskeletal Exam: High arches noted bilateral with tailor's bunion deformity is present to the right foot  Radiographic Exam RT foot 12/19/2024:  Normal osseous mineralization. Joint spaces preserved.  No significant degenerative changes noted.  Tailor's bunionette deformity noted with lateral deviation of the fifth metatarsal head  Assessment/Plan of Care: 1.  High arches with increased pressure to the forefoot/metatarsalgia bilateral 2.  Tailor's bunionette deformity right foot  -Patient evaluated.  X-rays reviewed -  Continue wearing good supportive tennis shoes and sneakers. -I do believe that custom orthotics should help to potentially alleviate a lot of the patient's pain and tenderness.  Recommend built-in metatarsal pad to offload pressure from the forefoot and fifth MTP offload -Today patient was scanned for custom orthotics -Return to clinic orthotics pickup.  If orthotics do not seem to alleviate the  patient's symptoms I do recommend tailor's bunionette surgery   *Patient does economist; part-time.  Very active and enjoys hiking *His wife is an employee for: Cancer research   Thresa EMERSON Sar, DPM [facility]  Dr. Thresa EMERSON Sar, DPM    [address] [place], [place] [address]                Office [phone]  Fax [phone]        [1] No Known Allergies  "

## 2025-02-05 ENCOUNTER — Ambulatory Visit: Admitting: Neurology

## 2025-04-04 ENCOUNTER — Ambulatory Visit: Admitting: Orthopaedic Surgery
